# Patient Record
Sex: Female | Born: 1980 | Hispanic: No | Marital: Married | Smoking: Never smoker
Health system: Southern US, Community
[De-identification: ages and names within clinical notes are randomized; demographics above are authoritative.]

## PROBLEM LIST (undated history)

## (undated) DIAGNOSIS — N760 Acute vaginitis: Secondary | ICD-10-CM

## (undated) DIAGNOSIS — B9689 Other specified bacterial agents as the cause of diseases classified elsewhere: Secondary | ICD-10-CM

## (undated) DIAGNOSIS — O039 Complete or unspecified spontaneous abortion without complication: Secondary | ICD-10-CM

## (undated) DIAGNOSIS — N7011 Chronic salpingitis: Secondary | ICD-10-CM

## (undated) DIAGNOSIS — A599 Trichomoniasis, unspecified: Secondary | ICD-10-CM

## (undated) DIAGNOSIS — D219 Benign neoplasm of connective and other soft tissue, unspecified: Secondary | ICD-10-CM

## (undated) DIAGNOSIS — D649 Anemia, unspecified: Secondary | ICD-10-CM

## (undated) DIAGNOSIS — N946 Dysmenorrhea, unspecified: Secondary | ICD-10-CM

## (undated) HISTORY — DX: Complete or unspecified spontaneous abortion without complication: O03.9

## (undated) HISTORY — DX: Trichomoniasis, unspecified: A59.9

## (undated) HISTORY — DX: Dysmenorrhea, unspecified: N94.6

## (undated) HISTORY — DX: Chronic salpingitis: N70.11

## (undated) HISTORY — DX: Other specified bacterial agents as the cause of diseases classified elsewhere: B96.89

## (undated) HISTORY — DX: Acute vaginitis: N76.0

## (undated) HISTORY — PX: LASIK: SHX215

---

## 2002-09-29 ENCOUNTER — Other Ambulatory Visit: Admission: RE | Admit: 2002-09-29 | Discharge: 2002-09-29 | Payer: Self-pay | Admitting: Obstetrics and Gynecology

## 2003-10-01 ENCOUNTER — Other Ambulatory Visit: Admission: RE | Admit: 2003-10-01 | Discharge: 2003-10-01 | Payer: Self-pay | Admitting: Obstetrics and Gynecology

## 2005-07-13 ENCOUNTER — Other Ambulatory Visit: Admission: RE | Admit: 2005-07-13 | Discharge: 2005-07-13 | Payer: Self-pay | Admitting: Obstetrics and Gynecology

## 2007-01-10 ENCOUNTER — Emergency Department: Payer: Self-pay | Admitting: Emergency Medicine

## 2009-11-22 ENCOUNTER — Emergency Department: Payer: Self-pay | Admitting: Emergency Medicine

## 2010-10-13 ENCOUNTER — Ambulatory Visit: Payer: Self-pay | Admitting: Urology

## 2015-10-12 ENCOUNTER — Encounter: Payer: Self-pay | Admitting: Emergency Medicine

## 2015-10-12 ENCOUNTER — Emergency Department: Payer: 59

## 2015-10-12 DIAGNOSIS — O200 Threatened abortion: Secondary | ICD-10-CM | POA: Diagnosis not present

## 2015-10-12 DIAGNOSIS — O209 Hemorrhage in early pregnancy, unspecified: Secondary | ICD-10-CM | POA: Diagnosis present

## 2015-10-12 DIAGNOSIS — Z3A01 Less than 8 weeks gestation of pregnancy: Secondary | ICD-10-CM | POA: Diagnosis not present

## 2015-10-12 LAB — CBC
HCT: 35.9 % (ref 35.0–47.0)
Hemoglobin: 11.4 g/dL — ABNORMAL LOW (ref 12.0–16.0)
MCH: 26.2 pg (ref 26.0–34.0)
MCHC: 31.8 g/dL — ABNORMAL LOW (ref 32.0–36.0)
MCV: 82.5 fL (ref 80.0–100.0)
Platelets: 271 10*3/uL (ref 150–440)
RBC: 4.35 MIL/uL (ref 3.80–5.20)
RDW: 13.9 % (ref 11.5–14.5)
WBC: 8.8 10*3/uL (ref 3.6–11.0)

## 2015-10-12 LAB — URINALYSIS COMPLETE WITH MICROSCOPIC (ARMC ONLY)
Bilirubin Urine: NEGATIVE
Glucose, UA: NEGATIVE mg/dL
Ketones, ur: NEGATIVE mg/dL
Nitrite: NEGATIVE
Protein, ur: 30 mg/dL — AB
Specific Gravity, Urine: 1.006 (ref 1.005–1.030)
pH: 7 (ref 5.0–8.0)

## 2015-10-12 LAB — HCG, QUANTITATIVE, PREGNANCY: hCG, Beta Chain, Quant, S: 4076 m[IU]/mL — ABNORMAL HIGH (ref <5)

## 2015-10-12 NOTE — ED Notes (Signed)
Patient ambulatory to triage with steady gait, without difficulty or distress noted; pt reports increased vag bleeding and lower abd cramping today; st has had spotting with +pregnancy test at [facility]; hx fibroids

## 2015-10-13 ENCOUNTER — Emergency Department
Admission: EM | Admit: 2015-10-13 | Discharge: 2015-10-13 | Disposition: A | Discharge status: Home / Self Care | Payer: 59 | Attending: Emergency Medicine | Admitting: Emergency Medicine

## 2015-10-13 ENCOUNTER — Encounter: Payer: Self-pay | Admitting: Emergency Medicine

## 2015-10-13 DIAGNOSIS — O200 Threatened abortion: Secondary | ICD-10-CM

## 2015-10-13 LAB — ABO/RH: ABO/RH(D): O POS

## 2015-10-13 NOTE — Discharge Instructions (Signed)

## 2015-10-13 NOTE — ED Provider Notes (Signed)
[facility] Emergency Department Provider Note  ____________________________________________  Time seen: 1:00 AM I have reviewed the triage vital signs and the nursing notes.   HISTORY  Chief Complaint Vaginal Bleeding     HPI Toni Byrd is a 34 y.o. female presents with vaginal bleeding 3 weeks with acute worsening today accompanied by clots. Patient states that she was informed last week that she was pregnant by [facility]. Last menstrual period October 27. Patient states earlier she had considerable pelvic cramps 9 out of 10 and since resolved.     Past medical history Uterine fibroid There are no active problems to display for this patient.   Past Surgical History  Procedure Laterality Date  . Lasik      No current outpatient prescriptions on file.  Allergies No known drug allergies No family history on file.  Social History Social History  Substance Use Topics  . Smoking status: Never Smoker   . Smokeless tobacco: None  . Alcohol Use: No    Review of Systems  Constitutional: Negative for fever. Eyes: Negative for visual changes. ENT: Negative for sore throat. Cardiovascular: Negative for chest pain. Respiratory: Negative for shortness of breath. Gastrointestinal: Negative for abdominal pain, vomiting and diarrhea. Genitourinary: Negative for dysuria. Musculoskeletal: Negative for back pain. Skin: Negative for rash. Neurological: Negative for headaches, focal weakness or numbness.   10-point ROS otherwise negative.  ____________________________________________   PHYSICAL EXAM:  VITAL SIGNS: ED Triage Vitals  Enc Vitals Group     BP 10/12/15 2111 145/84 mmHg     Pulse Rate 10/12/15 2111 100     Resp 10/12/15 2111 18     Temp 10/12/15 2111 98.5 F (36.9 C)     Temp Source 10/12/15 2111 Oral     SpO2 10/12/15 2111 97 %     Weight 10/12/15 2111 188 lb (85.276 kg)     Height 10/12/15 2111 5\' 7"  (1.702 m)   Head Cir --      Peak Flow --      Pain Score 10/13/15 0043 9     Pain Loc --      Pain Edu? --      Excl. in [place]? --      Constitutional: Alert and oriented. Well appearing and in no distress. Eyes: Conjunctivae are normal. PERRL. Normal extraocular movements. ENT   Head: Normocephalic and atraumatic.   Nose: No congestion/rhinnorhea.   Mouth/Throat: Mucous membranes are moist.   Neck: No stridor. Hematological/Lymphatic/Immunilogical: No cervical lymphadenopathy. Cardiovascular: Normal rate, regular rhythm. Normal and symmetric distal pulses are present in all extremities. No murmurs, rubs, or gallops. Respiratory: Normal respiratory effort without tachypnea nor retractions. Breath sounds are clear and equal bilaterally. No wheezes/rales/rhonchi. Gastrointestinal: Soft and nontender. No distention. There is no CVA tenderness. Genitourinary: deferred Musculoskeletal: Nontender with normal range of motion in all extremities. No joint effusions.  No lower extremity tenderness nor edema. Neurologic:  Normal speech and language. No gross focal neurologic deficits are appreciated. Speech is normal.  Skin:  Skin is warm, dry and intact. No rash noted. Psychiatric: Mood and affect are normal. Speech and behavior are normal. Patient exhibits appropriate insight and judgment.  ____________________________________________    LABS (pertinent positives/negatives)  Labs Reviewed  HCG, QUANTITATIVE, PREGNANCY - Abnormal; Notable for the following:    hCG, Beta Chain, Quant, S 4076 (*)    All other components within normal limits  CBC - Abnormal; Notable for the following:    Hemoglobin 11.4 (*)  MCHC 31.8 (*)    All other components within normal limits  URINALYSIS COMPLETEWITH MICROSCOPIC ([facility] ONLY) - Abnormal; Notable for the following:    Color, Urine RED (*)    APPearance HAZY (*)    Hgb urine dipstick 3+ (*)    Protein, ur 30 (*)    Leukocytes, UA TRACE (*)     Bacteria, UA RARE (*)    Squamous Epithelial / LPF 0-5 (*)    All other components within normal limits  ABO/RH       RADIOLOGY US OB Comp Less 14 Wks (Final result) Result time: 10/12/15 22:40:10   Final result by Rad Results In Interface (10/12/15 22:40:10)   Narrative:   CLINICAL DATA: Vaginal bleeding for 1 day  EXAM: OBSTETRIC <14 WK [place] AND TRANSVAGINAL OB US  TECHNIQUE: Both transabdominal and transvaginal ultrasound examinations were performed for complete evaluation of the gestation as well as the maternal uterus, adnexal regions, and pelvic cul-de-sac. Transvaginal technique was performed to assess early pregnancy.  COMPARISON: None.  FINDINGS: Intrauterine gestational sac: No  Yolk sac: No  Embryo: No  Cardiac Activity: No  Heart Rate:  bpm  MSD:  mm  w   d  CRL:  mm  w  d         [place] EDC:  Maternal uterus/adnexae: Normal right ovary. Nonvisualization of the left ovary. Small amount of fluid in the endometrial canal. No free pelvic fluid. Multiple uterine fibroids measuring up to 2.0 cm.  IMPRESSION: No visible gestational sac. There is blood or fluid in the endometrial canal. Suspicious for early failed pregnancy Recommend follow-up US in 10-14 days for definitive diagnosis. This recommendation follows SRU consensus guidelines: Diagnostic Criteria for Nonviable Pregnancy Early in the First Trimester. [place] Med 2013KT:048977.  Multiple uterine fibroids measuring up to 2.0 cm.   Electronically Signed By: Andreas Newport M.D. On: 10/12/2015 22:40          US Ob Transvaginal (Final result) Result time: 10/12/15 22:40:10   Final result by Rad Results In Interface (10/12/15 22:40:10)   Narrative:   CLINICAL DATA: Vaginal bleeding for 1 day  EXAM: OBSTETRIC <14 WK [place] AND TRANSVAGINAL OB US  TECHNIQUE: Both transabdominal and transvaginal ultrasound examinations were performed for complete  evaluation of the gestation as well as the maternal uterus, adnexal regions, and pelvic cul-de-sac. Transvaginal technique was performed to assess early pregnancy.  COMPARISON: None.  FINDINGS: Intrauterine gestational sac: No  Yolk sac: No  Embryo: No  Cardiac Activity: No  Heart Rate:  bpm  MSD:  mm  w   d  CRL:  mm  w  d         [place] EDC:  Maternal uterus/adnexae: Normal right ovary. Nonvisualization of the left ovary. Small amount of fluid in the endometrial canal. No free pelvic fluid. Multiple uterine fibroids measuring up to 2.0 cm.  IMPRESSION: No visible gestational sac. There is blood or fluid in the endometrial canal. Suspicious for early failed pregnancy Recommend follow-up US in 10-14 days for definitive diagnosis. This recommendation follows SRU consensus guidelines: Diagnostic Criteria for Nonviable Pregnancy Early in the First Trimester. [place] Med 2013KT:048977.  Multiple uterine fibroids measuring up to 2.0 cm.   Electronically Signed By: Andreas Newport M.D. On: 10/12/2015 22:40        INITIAL IMPRESSION / ASSESSMENT AND PLAN / ED COURSE  Pertinent labs & imaging results that were available during my care of  the patient were reviewed by me and considered in my medical decision making (see chart for details).  History and physical exam concerning for possible miscarriage patient was to follow up with [facility] side OB for repeat and serial hCG  ____________________________________________   FINAL CLINICAL IMPRESSION(S) / ED DIAGNOSES  Final diagnoses:  Threatened miscarriage      Gregor Hams, MD 10/13/15 0145

## 2015-10-13 NOTE — ED Notes (Signed)
Pt in with co vaginal bleeding x 3 weeks, states was lighter then a period.  Was told last week she was pregnant, states first pregnancy.  Was told last week she is pregnant and today bleeding became heavier with clots.  Also having abdominal cramping rates it 9/10.  Hx of uterine fibroids.

## 2015-10-13 NOTE — ED Notes (Signed)
Pt discharged home with significant other.  Discharge instructions reviewed, voiced understanding.  No questions or concerns at this time.  Pt in NAD.  Items with pt upon discharge, no items left in ED.

## 2016-11-20 DIAGNOSIS — R1032 Left lower quadrant pain: Secondary | ICD-10-CM | POA: Diagnosis not present

## 2016-11-20 DIAGNOSIS — Z01419 Encounter for gynecological examination (general) (routine) without abnormal findings: Secondary | ICD-10-CM | POA: Diagnosis not present

## 2016-11-20 DIAGNOSIS — Z1389 Encounter for screening for other disorder: Secondary | ICD-10-CM | POA: Diagnosis not present

## 2016-11-20 DIAGNOSIS — N921 Excessive and frequent menstruation with irregular cycle: Secondary | ICD-10-CM | POA: Diagnosis not present

## 2016-12-10 DIAGNOSIS — N920 Excessive and frequent menstruation with regular cycle: Secondary | ICD-10-CM | POA: Diagnosis not present

## 2016-12-10 DIAGNOSIS — N921 Excessive and frequent menstruation with irregular cycle: Secondary | ICD-10-CM | POA: Diagnosis not present

## 2016-12-10 DIAGNOSIS — R1032 Left lower quadrant pain: Secondary | ICD-10-CM | POA: Diagnosis not present

## 2017-01-24 DIAGNOSIS — J019 Acute sinusitis, unspecified: Secondary | ICD-10-CM | POA: Diagnosis not present

## 2017-05-07 ENCOUNTER — Telehealth: Payer: Self-pay | Admitting: Obstetrics and Gynecology

## 2017-05-07 NOTE — Telephone Encounter (Signed)
Pt is schedule 02/15/97 with Elmo Putt Copland

## 2017-05-07 NOTE — Telephone Encounter (Signed)
Pt called the after hour nurse line to request an appointment for Urination pain. I called and left voicemail for patient to call back to be schedule.

## 2017-05-08 ENCOUNTER — Ambulatory Visit (INDEPENDENT_AMBULATORY_CARE_PROVIDER_SITE_OTHER): Payer: 59 | Admitting: Obstetrics and Gynecology

## 2017-05-08 ENCOUNTER — Encounter: Payer: Self-pay | Admitting: Obstetrics and Gynecology

## 2017-05-08 VITALS — BP 118/74 | Ht 67.0 in | Wt 185.0 lb

## 2017-05-08 DIAGNOSIS — N3000 Acute cystitis without hematuria: Secondary | ICD-10-CM | POA: Diagnosis not present

## 2017-05-08 DIAGNOSIS — Z3169 Encounter for other general counseling and advice on procreation: Secondary | ICD-10-CM

## 2017-05-08 LAB — POCT URINALYSIS DIPSTICK
Bilirubin, UA: NEGATIVE
Blood, UA: NEGATIVE
Glucose, UA: NEGATIVE
Ketones, UA: NEGATIVE
Nitrite, UA: NEGATIVE
Protein, UA: NEGATIVE
Spec Grav, UA: 1.015 (ref 1.010–1.025)
Urobilinogen, UA: NEGATIVE E.U./dL
pH, UA: 7 (ref 5.0–8.0)

## 2017-05-08 MED ORDER — NITROFURANTOIN MONOHYD MACRO 100 MG PO CAPS: 100.0000 mg | ORAL_CAPSULE | Freq: Two times a day (BID) | ORAL | 0 refills | Status: AC

## 2017-05-08 NOTE — Progress Notes (Signed)
Chief Complaint  Patient presents with  . Urinary Tract Infection    HPI:      [name] is a 36 y.o. G1P0010 who LMP was Patient's last menstrual period was 05/01/2017., presents today for UTI with urinary frequency/urgency, dysuria for 3 days. No LBP, belly pain, fevers, hematuria, vag sx. She has a hx of UTIs several yrs ago.   She also has been unable to conceive in the past 8 months. She is s/p SAB last yr. It took awhile to conceive last time as well. Menses are Q21-28 days. She has tried urine ovulation kit that was negative.   There are no active problems to display for this patient.   Family History  Problem Relation Age of Onset  . Rheum arthritis Mother   . Hypothyroidism Mother   . Hypertension Father   . Breast cancer Maternal Grandmother     Social History   Social History  . Marital status: Unknown    Spouse name: N/A  . Number of children: N/A  . Years of education: N/A   Occupational History  . Not on file.   Social History Main Topics  . Smoking status: Never Smoker  . Smokeless tobacco: Never Used  . Alcohol use No  . Drug use: Unknown  . Sexual activity: Yes    Birth control/ protection: None   Other Topics Concern  . Not on file   Social History Narrative  . No narrative on file     Current Outpatient Prescriptions:  .  nitrofurantoin, macrocrystal-monohydrate, (MACROBID) 100 MG capsule, Take 1 capsule (100 mg total) by mouth 2 (two) times daily., Disp: 14 capsule, Rfl: 0  Review of Systems  Constitutional: Negative for fever.  Gastrointestinal: Negative for blood in stool, constipation, diarrhea, nausea and vomiting.  Genitourinary: Positive for dysuria, frequency and urgency. Negative for dyspareunia, flank pain, hematuria, vaginal bleeding, vaginal discharge and vaginal pain.  Musculoskeletal: Negative for back pain.  Skin: Negative for rash.     OBJECTIVE:   Vitals:  BP 118/74   Ht _0  (1.702 m)   Wt 185 lb  (83.9 kg)   LMP 05/01/2017   BMI 28.98 kg/m   Physical Exam  Constitutional: She is oriented to person, place, and time and well-developed, well-nourished, and in no distress.  Abdominal: There is no CVA tenderness.  Neurological: She is alert and oriented to person, place, and time.  Psychiatric: Affect and judgment normal.  Vitals reviewed.   Results: Results for orders placed or performed in visit on 05/08/17 (from the past 24 hour(s))  POCT Urinalysis Dipstick     Status: Abnormal   Collection Time: 05/08/17 11:46 AM  Result Value Ref Range   Color, UA yellow    Clarity, UA clear    Glucose, UA neg    Bilirubin, UA neg    Ketones, UA neg    Spec Grav, UA 1.015 1.010 - 1.025   Blood, UA neg    pH, UA 7.0 5.0 - 8.0   Protein, UA neg    Urobilinogen, UA negative 0.2 or 1.0 E.U./dL   Nitrite, UA neg    Leukocytes, UA Small (1+) (A) Negative     Assessment/Plan: Acute cystitis without hematuria - Rx macrobid. Check C&S. F/u prn.  - Plan: POCT Urinalysis Dipstick, nitrofurantoin, macrocrystal-monohydrate, (MACROBID) 100 MG capsule, Urine Culture  Infertility counseling - Pt without conception in 8 mos, s/p SAB last yr. Menses Q21-28 days. Check prog July 9 (about  day 19). Will f/u with results.     Meds ordered this encounter  Medications  . nitrofurantoin, macrocrystal-monohydrate, (MACROBID) 100 MG capsule    Sig: Take 1 capsule (100 mg total) by mouth 2 (two) times daily.    Dispense:  14 capsule    Refill:  0      Return if symptoms worsen or fail to improve.  Alicia B. Copland, PA-C 05/08/2017 11:47 AM

## 2017-05-10 LAB — URINE CULTURE: Organism ID, Bacteria: NO GROWTH

## 2017-05-20 ENCOUNTER — Other Ambulatory Visit: Payer: 59

## 2017-05-20 DIAGNOSIS — Z3169 Encounter for other general counseling and advice on procreation: Secondary | ICD-10-CM

## 2017-05-21 ENCOUNTER — Telehealth: Payer: Self-pay | Admitting: Obstetrics and Gynecology

## 2017-05-21 LAB — PROGESTERONE: Progesterone: 1.9 ng/mL

## 2017-05-21 NOTE — Telephone Encounter (Signed)
LM for pt with neg prog results. Pt to call with date of next menses to correlate prog with cycle. If done at right time (pt's menses Q21-28 days), will refer to MD for further mgmt.

## 2017-06-07 ENCOUNTER — Telehealth: Payer: Self-pay

## 2017-06-07 DIAGNOSIS — N839 Noninflammatory disorder of ovary, fallopian tube and broad ligament, unspecified: Secondary | ICD-10-CM

## 2017-06-07 NOTE — Telephone Encounter (Signed)
Pt calling to let ABC know she started her cycle on 06/01/17

## 2017-06-10 NOTE — Telephone Encounter (Signed)
LMTRC

## 2017-06-11 NOTE — Telephone Encounter (Signed)
LMTRC

## 2017-06-11 NOTE — Telephone Encounter (Signed)
Pt returning ABC call regarding test results. Cb#[phone].

## 2017-06-14 NOTE — Telephone Encounter (Signed)
Pt calling for results, pt aware ABC is not in the office

## 2017-06-17 NOTE — Telephone Encounter (Signed)
Pt states that her cycles vary from 21-28 day cycles but they typically start around the 19th or 20th of each month. Pt aware ABC out of the office. Please advise. Thank you!

## 2017-06-17 NOTE — Telephone Encounter (Signed)
RN to notify pt that progesterone test not done at correct time since her last menses was late. What is cycle length from 1st day of one period to first day of next? Can rechk labs again but is dependent on her cycle frequency.

## 2017-06-17 NOTE — Telephone Encounter (Signed)
Left msg for pt to call back

## 2017-06-19 NOTE — Telephone Encounter (Signed)
Will rechk prog Aug 13 then. Order in. RN to notify pt and put her on lab schedule for that day.

## 2017-06-20 NOTE — Telephone Encounter (Signed)
Pt aware and transferred to front desk to be added to lab schedule.

## 2017-06-24 ENCOUNTER — Other Ambulatory Visit: Payer: 59

## 2017-06-24 DIAGNOSIS — N839 Noninflammatory disorder of ovary, fallopian tube and broad ligament, unspecified: Secondary | ICD-10-CM

## 2017-06-25 LAB — PROGESTERONE: Progesterone: 5.5 ng/mL

## 2017-08-06 ENCOUNTER — Ambulatory Visit (INDEPENDENT_AMBULATORY_CARE_PROVIDER_SITE_OTHER): Payer: 59 | Admitting: Advanced Practice Midwife

## 2017-08-06 ENCOUNTER — Encounter: Payer: Self-pay | Admitting: Advanced Practice Midwife

## 2017-08-06 VITALS — BP 124/74 | Wt 197.0 lb

## 2017-08-06 DIAGNOSIS — Z3490 Encounter for supervision of normal pregnancy, unspecified, unspecified trimester: Secondary | ICD-10-CM

## 2017-08-06 DIAGNOSIS — O200 Threatened abortion: Secondary | ICD-10-CM

## 2017-08-06 NOTE — Progress Notes (Signed)
New Obstetric Patient H&P    Chief Complaint: "Desires prenatal care"   History of Present Illness: Patient is a 36 y.o. G2P0010 Not Hispanic or [place] female, LMP 06/29/2017 presents with amenorrhea and positive home pregnancy test. Based on her LMP, her EDD is Estimated Date of Delivery: 04/05/2018. and her EGA is [redacted]w[redacted]d. Cycles are 4. days, regular, and occur approximately every : 28 days. Her last pap smear was 1 years ago and was no abnormalities.    She had a urine pregnancy test which was positive 1 day(s)  ago. Her last menstrual period was normal and lasted for  3 or 4 day(s). Since her LMP she claims she has experienced cramping on Sunday night and bleeding using pads yesterday. She had sharp left and mid pelvic pain this morning and bleeding resumed. Her past medical history is noncontributory. Her prior pregnancies are notable for miscarriage 2 years ago  Since her LMP, she admits to the use of tobacco products  no She claims she has gained   a few pounds since the start of her pregnancy.  There are cats in the home in the home  no  She admits close contact with children on a regular basis  yes  She has had chicken pox in the past yes She has had Tuberculosis exposures, symptoms, or previously tested positive for TB   no Current or past history of domestic violence. no  Genetic Screening/Teratology Counseling: (Includes patient, baby's father, or anyone in either family with:)   45. Patient's age >/= 22 at [place]  no 2. Thalassemia ([place], [place], [place], or Asian background): MCV<80  no 3. Neural tube defect (meningomyelocele, spina bifida, anencephaly)  no 4. Congenital heart defect  no  5. Down syndrome  no 6. Tay-Sachs (Jewish, [place])  no 7. Canavan's Disease  no 8. Sickle cell disease or trait (African)  no  9. Hemophilia or other blood disorders  no  10. Muscular dystrophy  no  11. Cystic fibrosis  no  12. Huntington's Chorea  no  13. Mental  retardation/autism  no 14. Other inherited genetic or chromosomal disorder  no 15. Maternal metabolic disorder (DM, PKU, etc)  no 16. Patient or FOB with a child with a birth defect not listed above no  16a. Patient or FOB with a birth defect themselves no 17. Recurrent pregnancy loss, or stillbirth  no  18. Any medications since LMP other than prenatal vitamins (include vitamins, supplements, OTC meds, drugs, alcohol)  no 19. Any other genetic/environmental exposure to discuss: The patient works in toxicology at [employer]  Infection History:   1. Lives with someone with TB or TB exposed  no  2. Patient or partner has history of genital herpes  no 3. Rash or viral illness since LMP  no 4. History of STI (GC, CT, HPV, syphilis, HIV)  no 5. History of recent travel :  no  Other pertinent information:  no     Review of Systems:10 point review of systems negative unless otherwise noted in HPI  Past Medical History:  Past Medical History:  Diagnosis Date  . Bacterial vaginitis   . Dysmenorrhea   . Hydrosalpinx   . Spontaneous abortion   . Trichomoniasis     Past Surgical History:  Past Surgical History:  Procedure Laterality Date  . LASIK      Gynecologic History: Patient's last menstrual period was 05/29/2017.  Obstetric History: G2P0010  Family History:  Family History  Problem Relation Age  of Onset  . Rheum arthritis Mother   . Hypothyroidism Mother   . Hypertension Father   . Breast cancer Maternal Grandmother     Social History:  Social History   Social History  . Marital status: Unknown    Spouse name: N/A  . Number of children: N/A  . Years of education: N/A   Occupational History  . Not on file.   Social History Main Topics  . Smoking status: Never Smoker  . Smokeless tobacco: Never Used  . Alcohol use No  . Drug use: Unknown  . Sexual activity: Yes    Birth control/ protection: None   Other Topics Concern  . Not on file   Social History  Narrative  . No narrative on file    Allergies:  No Known Allergies  Medications: Prior to Admission medications   Not on File    Physical Exam Vitals: Blood pressure 124/74, weight 197 lb (89.4 kg), last menstrual period 05/29/2017.  General: NAD HEENT: normocephalic, anicteric Thyroid: no enlargement, no palpable nodules Pulmonary: No increased work of breathing, CTAB Cardiovascular: RRR, distal pulses 2+ Abdomen: NABS, soft, non-tender, non-distended.  Umbilicus without lesions.  No hepatomegaly, splenomegaly or masses palpable. No evidence of hernia  Genitourinary: Deferred for bleeding today Extremities: no edema, erythema, or tenderness Neurologic: Grossly intact Psychiatric: mood appropriate, affect full POCT pregnancy test positive   Assessment: 36 y.o. G2P0010 at Unknown presenting to initiate prenatal care  Plan: 1) Avoid alcoholic beverages. 2) Patient encouraged not to smoke.  3) Discontinue the use of all non-medicinal drugs and chemicals.  4) Take prenatal vitamins daily.  5) Nutrition, food safety (fish, cheese advisories, and high nitrite foods) and exercise discussed. 6) Hospital and practice style discussed with cross coverage system.  7) Genetic Screening, such as with 1st Trimester Screening, cell free fetal DNA, AFP testing, and Ultrasound, as well as with amniocentesis and CVS as appropriate, is discussed with patient. At the conclusion of today's visit patient requested genetic testing 8) Patient is asked about travel to areas at risk for the Zika virus, and counseled to avoid travel and exposure to mosquitoes or sexual partners who may have themselves been exposed to the virus. Testing is discussed, and will be ordered as appropriate.  9) Serial beta Hcg levels today and on Thursday 10) Viability scan when patient returns from her vacation  Rod Can, CNM

## 2017-08-06 NOTE — Progress Notes (Signed)
NOB today.  

## 2017-08-07 LAB — BETA HCG QUANT (REF LAB): hCG Quant: 42 m[IU]/mL

## 2017-08-08 ENCOUNTER — Other Ambulatory Visit: Payer: 59

## 2017-08-08 DIAGNOSIS — O200 Threatened abortion: Secondary | ICD-10-CM

## 2017-08-08 LAB — URINE CULTURE

## 2017-08-08 LAB — BETA HCG QUANT (REF LAB): hCG Quant: 43 m[IU]/mL

## 2017-08-14 ENCOUNTER — Other Ambulatory Visit: Payer: Self-pay | Admitting: Advanced Practice Midwife

## 2017-08-14 ENCOUNTER — Ambulatory Visit (INDEPENDENT_AMBULATORY_CARE_PROVIDER_SITE_OTHER): Payer: 59 | Admitting: Obstetrics and Gynecology

## 2017-08-14 ENCOUNTER — Ambulatory Visit (INDEPENDENT_AMBULATORY_CARE_PROVIDER_SITE_OTHER): Payer: 59

## 2017-08-14 VITALS — BP 118/74 | Wt 198.0 lb

## 2017-08-14 DIAGNOSIS — O039 Complete or unspecified spontaneous abortion without complication: Secondary | ICD-10-CM | POA: Diagnosis not present

## 2017-08-14 DIAGNOSIS — O021 Missed abortion: Secondary | ICD-10-CM

## 2017-08-14 DIAGNOSIS — O200 Threatened abortion: Secondary | ICD-10-CM

## 2017-08-14 NOTE — Progress Notes (Signed)
   Gynecology Ultrasound Follow Up   Chief Complaint  Patient presents with  . Follow up ultrasound for possible spontaneous abortion     History of Present Illness: Patient is a 36 y.o. female who presents today for ultrasound evaluation of possible spontaneous abortion.  Ultrasound demonstrates the following findings Adnexa: no masses seen  Uterus: anteverted with endometrial stripe  4.9 mm Additional: No evidence of intrauterine pregnancy.  Fibroids noted (see image).  a  Bleeding last week, ended this past Sunday. No abdominal pain, no fevers, chills.    Past Medical History:  Diagnosis Date  . Bacterial vaginitis   . Dysmenorrhea   . Hydrosalpinx   . Spontaneous abortion   . Trichomoniasis     Past Surgical History:  Procedure Laterality Date  . LASIK       Family History  Problem Relation Age of Onset  . Rheum arthritis Mother   . Hypothyroidism Mother   . Hypertension Father   . Breast cancer Maternal Grandmother     Social History   Social History  . Marital status: Unknown    Spouse name: N/A  . Number of children: N/A  . Years of education: N/A   Occupational History  . Not on file.   Social History Main Topics  . Smoking status: Never Smoker  . Smokeless tobacco: Never Used  . Alcohol use No  . Drug use: Unknown  . Sexual activity: Yes    Birth control/ protection: None   Other Topics Concern  . Not on file   Social History Narrative  . No narrative on file    No Known Allergies  Prior to Admission medications   Not on File    Physical Exam BP 118/74   Wt 198 lb (89.8 kg)   LMP 05/29/2017   BMI 31.01 kg/m   Physical Exam  Constitutional: She is oriented to person, place, and time and well-developed, well-nourished, and in no distress. No distress.  HENT:  Head: Normocephalic and atraumatic.  Eyes: Conjunctivae are normal. No scleral icterus.  Cardiovascular: Normal rate and regular rhythm.   Pulmonary/Chest: Effort  normal and breath sounds normal.  Abdominal: Soft. Bowel sounds are normal. She exhibits no distension. There is no tenderness. There is no rebound and no guarding.  Musculoskeletal: She exhibits no edema or tenderness.  Neurological: She is alert and oriented to person, place, and time.  Skin: Skin is warm and dry. No erythema.  Psychiatric: Mood, affect and judgment normal.    Assessment: 36 y.o. G2P0010 No problem-specific Assessment & Plan notes found for this encounter.   Plan: Problem List Items Addressed This Visit    None    Visit Diagnoses    Missed abortion    -  Primary   Relevant Orders   Beta HCG, Quant     It appears she has nearly completed a  Spontaneous abortion. However, she did not have a significant drop in her hCG levels form her prior two values. Will repeat today. If not dropping significantly, will have to consider other diagnoses.  Blood type is O+.  15 minutes spent in face to face discussion with > 50% spent in counseling and management of her spontaneous abortion.    Prentice Docker, MD 08/16/2017 3:04 PM

## 2017-08-15 LAB — BETA HCG QUANT (REF LAB): hCG Quant: 17 m[IU]/mL

## 2017-08-16 ENCOUNTER — Telehealth: Payer: Self-pay | Admitting: Obstetrics and Gynecology

## 2017-08-16 NOTE — Telephone Encounter (Signed)
Left generic VM to call me back 

## 2017-08-19 ENCOUNTER — Telehealth: Payer: Self-pay

## 2017-08-19 DIAGNOSIS — N96 Recurrent pregnancy loss: Secondary | ICD-10-CM | POA: Insufficient documentation

## 2017-08-19 NOTE — Telephone Encounter (Signed)
Pt would like to discuss results c SDJ.  [phone]

## 2017-08-19 NOTE — Telephone Encounter (Signed)
Discussed drop in quant hCG. Patient would also like to pursue a workup of why is is having miscarriages.   Will start this workup once she has completed this miscarriage.  Will recheck hCG in 1 week.

## 2017-08-19 NOTE — Telephone Encounter (Signed)
Please call pt back.

## 2017-09-10 ENCOUNTER — Telehealth: Payer: Self-pay

## 2017-09-10 NOTE — Telephone Encounter (Signed)
Pt calling to discuss f/u and request call back from SDJ. Cb# [phone] thank you

## 2017-09-10 NOTE — Telephone Encounter (Signed)
Pt returned SDJ phone call 3:02 pm.

## 2017-09-10 NOTE — Telephone Encounter (Signed)
Left generic VM 

## 2017-09-11 NOTE — Telephone Encounter (Signed)
Pt asking for SDJ to please call her to discuss further options. [phone].

## 2017-09-17 NOTE — Telephone Encounter (Signed)
Left generic vm 

## 2017-09-19 ENCOUNTER — Telehealth: Payer: Self-pay

## 2017-09-19 NOTE — Telephone Encounter (Signed)
Left generic vm for her to return my call.

## 2017-09-19 NOTE — Telephone Encounter (Signed)
Pt calling for SDJ, She would like to speak to [facility] regarding options. No other information was given. CB# [phone]

## 2017-09-25 NOTE — Telephone Encounter (Signed)
Pt still has not called back, was not sure if you were going to call her back or complete msg

## 2017-10-10 ENCOUNTER — Telehealth: Payer: Self-pay

## 2017-10-10 NOTE — Telephone Encounter (Signed)
Pt calling to speak to SDJ to discuss options. Cb# [phone] thank you

## 2017-10-16 ENCOUNTER — Other Ambulatory Visit: Payer: Self-pay

## 2017-10-16 ENCOUNTER — Ambulatory Visit (INDEPENDENT_AMBULATORY_CARE_PROVIDER_SITE_OTHER): Payer: 59 | Admitting: Obstetrics and Gynecology

## 2017-10-16 ENCOUNTER — Encounter: Payer: Self-pay | Admitting: Obstetrics and Gynecology

## 2017-10-16 ENCOUNTER — Ambulatory Visit: Payer: 59 | Admitting: Obstetrics and Gynecology

## 2017-10-16 VITALS — BP 110/70 | HR 76 | Ht 67.0 in | Wt 192.0 lb

## 2017-10-16 DIAGNOSIS — N760 Acute vaginitis: Secondary | ICD-10-CM | POA: Diagnosis not present

## 2017-10-16 DIAGNOSIS — B9689 Other specified bacterial agents as the cause of diseases classified elsewhere: Secondary | ICD-10-CM | POA: Diagnosis not present

## 2017-10-16 LAB — POCT WET PREP WITH KOH
KOH Prep POC: POSITIVE — AB
Trichomonas, UA: NEGATIVE
Yeast Wet Prep HPF POC: NEGATIVE

## 2017-10-16 MED ORDER — METRONIDAZOLE 1.3 % VA GEL: 1.0000 | Freq: Once | VAGINAL | 0 refills | Status: AC

## 2017-10-16 NOTE — Patient Instructions (Signed)
I value your feedback and entrusting us with your care. If you get a [facility] patient survey, I would appreciate you taking the time to let us know about your experience today. Thank you! 

## 2017-10-16 NOTE — Progress Notes (Signed)
Chief Complaint  Patient presents with  . Vaginal Discharge    Odorous d/c no itching x 3 days no OTC use    HPI:      Toni Byrd is a 36 y.o. G2P0010 who LMP was Patient's last menstrual period was 09/28/2017., presents today for increased vag d/c without odor or irritation for the past 4 days. Sx unusual for pt. Hx of BV in the past. No urin sx, no LBP, belly pain, fevers. S/P SAB 10/18. Has had a normal period since.  Past Medical History:  Diagnosis Date  . Bacterial vaginitis   . Dysmenorrhea   . Hydrosalpinx   . Spontaneous abortion   . Trichomoniasis     Past Surgical History:  Procedure Laterality Date  . LASIK      Family History  Problem Relation Age of Onset  . Rheum arthritis Mother   . Hypothyroidism Mother   . Hypertension Father   . Breast cancer Maternal Grandmother     Social History   Socioeconomic History  . Marital status: Unknown    Spouse name: Not on file  . Number of children: Not on file  . Years of education: Not on file  . Highest education level: Not on file  Social Needs  . Financial resource strain: Not on file  . Food insecurity - worry: Not on file  . Food insecurity - inability: Not on file  . Transportation needs - medical: Not on file  . Transportation needs - non-medical: Not on file  Occupational History  . Not on file  Tobacco Use  . Smoking status: Never Smoker  . Smokeless tobacco: Never Used  Substance and Sexual Activity  . Alcohol use: No  . Drug use: Not on file  . Sexual activity: Yes    Birth control/protection: None  Other Topics Concern  . Not on file  Social History Narrative  . Not on file     Current Outpatient Medications:  Marland Kitchen  MetroNIDAZOLE (NUVESSA) 1.3 % GEL, Place 1 Tube vaginally once for 1 dose., Disp: 1 Tube, Rfl: 0   ROS:  Review of Systems  Constitutional: Negative for fever.  Gastrointestinal: Negative for blood in stool, constipation, diarrhea, nausea and vomiting.    Genitourinary: Positive for vaginal discharge. Negative for dyspareunia, dysuria, flank pain, frequency, hematuria, urgency, vaginal bleeding and vaginal pain.  Musculoskeletal: Negative for back pain.  Skin: Negative for rash.     OBJECTIVE:   Vitals:  BP 110/70   Pulse 76   Ht 5\' 7"  (1.702 m)   Wt 192 lb (87.1 kg)   LMP 09/28/2017   BMI 30.07 kg/m   Physical Exam  Constitutional: She is oriented to person, place, and time and well-developed, well-nourished, and in no distress. Vital signs are normal.  Genitourinary: Uterus normal, cervix normal, right adnexa normal, left adnexa normal and vulva normal. Uterus is not enlarged. Cervix exhibits no motion tenderness and no tenderness. Right adnexum displays no mass and no tenderness. Left adnexum displays no mass and no tenderness. Vulva exhibits no erythema, no exudate, no lesion, no rash and no tenderness. Vagina exhibits no lesion. Thin  odorless  clear and vaginal discharge found.  Neurological: She is oriented to person, place, and time.  Vitals reviewed.   Results: Results for orders placed or performed in visit on 10/16/17 (from the past 24 hour(s))  POCT Wet Prep with KOH     Status: Abnormal   Collection Time: 10/16/17  2:35  PM  Result Value Ref Range   Trichomonas, UA Negative    Clue Cells Wet Prep HPF POC few    Epithelial Wet Prep HPF POC  Few, Moderate, Many, Too numerous to count   Yeast Wet Prep HPF POC neg    Bacteria Wet Prep HPF POC  Few   RBC Wet Prep HPF POC     WBC Wet Prep HPF POC     KOH Prep POC Positive (A) Negative     Assessment/Plan: Bacterial vaginosis - Rx nuvessa. Coupon card. F/u prn. Will RF if sx recur. - Plan: POCT Wet Prep with KOH, MetroNIDAZOLE (NUVESSA) 1.3 % GEL    Meds ordered this encounter  Medications  . MetroNIDAZOLE (NUVESSA) 1.3 % GEL    Sig: Place 1 Tube vaginally once for 1 dose.    Dispense:  1 Tube    Refill:  0      Return if symptoms worsen or fail to  improve.  Alicia B. Copland, PA-C 10/16/2017 2:37 PM

## 2017-10-17 NOTE — Telephone Encounter (Signed)
Left generic vm to call me back

## 2017-10-24 NOTE — Telephone Encounter (Signed)
Pt has not called back, do you want to try to call her back or send letter?

## 2017-10-24 NOTE — Telephone Encounter (Signed)
Pt called triage returning your call

## 2017-10-28 NOTE — Telephone Encounter (Signed)
Left generic vm 

## 2017-10-29 ENCOUNTER — Other Ambulatory Visit: Payer: 59

## 2017-10-29 ENCOUNTER — Other Ambulatory Visit: Payer: Self-pay | Admitting: Obstetrics and Gynecology

## 2017-10-29 ENCOUNTER — Telehealth: Payer: Self-pay

## 2017-10-29 DIAGNOSIS — N96 Recurrent pregnancy loss: Secondary | ICD-10-CM

## 2017-10-29 NOTE — Telephone Encounter (Signed)
Pt is schedule 10/29/17

## 2017-10-29 NOTE — Telephone Encounter (Signed)
Discussed workup with patient.  She would like to pursue.  Will get her scheduled for blood work initially. Will take work up further based on those results.

## 2017-10-29 NOTE — Telephone Encounter (Signed)
Pt is calling for SDJ or his nurse. She did not leave a detailed message but stated she would have her phone on her when you call back. CB# [phone]

## 2017-10-29 NOTE — Telephone Encounter (Signed)
fwding to SDJ, he will call today when he gets a chance.

## 2017-11-02 LAB — ANTIPHOSPHOLIPID SYNDROME COMP
APTT 1:1 NP: 27.8 s
APTT 1:1 Saline: 41.5 s
APTT: 34 s — ABNORMAL HIGH
Anticardiolipin Ab, IgA: <10 [APL'U]
Anticardiolipin Ab, IgG: <10 [GPL'U]
Anticardiolipin Ab, IgM: 10 [MPL'U]
Antiphosphatidylserine IgG: 6 {GPS'U}
Antiphosphatidylserine IgM: 6 {MPS'U}
Antiprothrombin Antibody, IgG: 21 G units — ABNORMAL HIGH
Beta-2 Glycoprotein I, IgA: <10 SAU
Beta-2 Glycoprotein I, IgG: <10 SGU
Beta-2 Glycoprotein I, IgM: <10 SMU
DRVVT Confirm Seconds: 38.6 s
DRVVT Ratio: 1.2 ratio
DRVVT Screen Seconds: 50 s — ABNORMAL HIGH
Hexagonal Phospholipid Neutral: 5 s
Platelet Neutralization: 0 s

## 2017-11-02 LAB — TSH+FREE T4
Free T4: 1.17 ng/dL (ref 0.82–1.77)
TSH: 0.805 u[IU]/mL (ref 0.450–4.500)

## 2017-11-02 LAB — PROLACTIN: Prolactin: 18.1 ng/mL (ref 4.8–23.3)

## 2017-11-02 LAB — HGB A1C W/O EAG: Hgb A1c MFr Bld: 5.5 % (ref 4.8–5.6)

## 2017-11-08 ENCOUNTER — Telehealth: Payer: Self-pay

## 2017-11-08 NOTE — Telephone Encounter (Signed)
Pt requesting to speak to SDJ. No other details given.

## 2017-11-08 NOTE — Telephone Encounter (Signed)
Left generic VM 

## 2017-11-08 NOTE — Telephone Encounter (Signed)
Pt returning [place] Call.

## 2017-11-13 NOTE — Telephone Encounter (Signed)
Pt returning call to SDJ to discuss results.

## 2017-11-20 ENCOUNTER — Encounter: Payer: Self-pay | Admitting: Obstetrics and Gynecology

## 2017-11-20 NOTE — Telephone Encounter (Signed)
Pt called nurse line requesting a call from [place].  [phone]

## 2017-11-26 ENCOUNTER — Telehealth: Payer: Self-pay

## 2017-11-26 DIAGNOSIS — N96 Recurrent pregnancy loss: Secondary | ICD-10-CM

## 2017-11-26 NOTE — Telephone Encounter (Signed)
Pt requests cb from SDJ. Cb#[phone]

## 2017-11-26 NOTE — Telephone Encounter (Signed)
Spoke with patient. All labs essentially normal (aPTT slightly elevated and will recheck in a few months). Recommended next step is SIS to see if fibroid has submucosal component for easy removal.   Patient voiced understanding and desire to proceed with the procedure.   Order placed and patient will call to schedule.

## 2017-11-26 NOTE — Telephone Encounter (Addendum)
Left generic VM. I have attempted to reach her on a couple of other occasions and we have exchanged messages on MyChart.

## 2017-11-27 NOTE — Telephone Encounter (Signed)
Please get scheduled for pt

## 2017-11-27 NOTE — Telephone Encounter (Signed)
Forwarding message to [facility] to schedule appointment

## 2017-12-09 ENCOUNTER — Other Ambulatory Visit: Payer: Self-pay | Admitting: Obstetrics and Gynecology

## 2017-12-09 DIAGNOSIS — N96 Recurrent pregnancy loss: Secondary | ICD-10-CM

## 2017-12-10 ENCOUNTER — Ambulatory Visit (INDEPENDENT_AMBULATORY_CARE_PROVIDER_SITE_OTHER): Payer: 59

## 2017-12-10 ENCOUNTER — Encounter: Payer: Self-pay | Admitting: Obstetrics and Gynecology

## 2017-12-10 ENCOUNTER — Ambulatory Visit (INDEPENDENT_AMBULATORY_CARE_PROVIDER_SITE_OTHER): Payer: 59 | Admitting: Obstetrics and Gynecology

## 2017-12-10 DIAGNOSIS — N96 Recurrent pregnancy loss: Secondary | ICD-10-CM

## 2017-12-10 DIAGNOSIS — D259 Leiomyoma of uterus, unspecified: Secondary | ICD-10-CM

## 2017-12-11 ENCOUNTER — Encounter: Payer: Self-pay | Admitting: Obstetrics and Gynecology

## 2017-12-11 DIAGNOSIS — D259 Leiomyoma of uterus, unspecified: Secondary | ICD-10-CM | POA: Insufficient documentation

## 2017-12-11 NOTE — Progress Notes (Signed)
   Procedure Saline Infusion Sonohysterogram:  After obtaining informed consent, the patient was placed in the dorsal supine lithotomy position.  A bivalved speculum was placed in her vagina with visualization of the cervix.  The cervix was prepped x 2 with betadine.  A single tooth tenaculum was utilized to place the catheter.  A small sonohysterogram catheter was threaded through the cervix.  The speculum was removed from the vagina.  The transvaginal ultrasound probe was placed in the vagina.  A total of 7 mL of sterile saline was injected in the uterus with capture and image reconstruction using 3-D ultrasound.  The patient tolerated the procedure well.    The uterine cavity demonstrated an abnormality of contour where the anterior fibroid was located.   See ultrasound report for complete ultrasound details.   Prentice Docker, MD 12/11/2017 3:21 PM

## 2017-12-11 NOTE — Progress Notes (Signed)
   Gynecology Ultrasound Follow Up  Chief Complaint: Follow up ultrasound, recurrent miscarriage  History of Present Illness: Patient is a 37 y.o. female who presents today for ultrasound evaluation of the above .  Ultrasound demonstrates the following findings Adnexa: no masses seen  Uterus: anteverted with endometrial stripe  5.1 mm Additional: fibroids noted: 1) anterior fundal (intramural) - 3.1 x 2.9 cm 2) posterior fundal (subserosal) - 1.6 x 1.35 cm 3) posterior body (subserosal) - 1.6 x 1 cm  SIS performed (see report) with noted impingement of anterior fibroid on cavity.   Past Medical History:  Diagnosis Date  . Bacterial vaginitis   . Dysmenorrhea   . Hydrosalpinx   . Spontaneous abortion   . Trichomoniasis     Past Surgical History:  Procedure Laterality Date  . LASIK     Family History  Problem Relation Age of Onset  . Rheum arthritis Mother   . Hypothyroidism Mother   . Hypertension Father   . Breast cancer Maternal Grandmother     Social History   Socioeconomic History  . Marital status: Unknown    Spouse name: Not on file  . Number of children: Not on file  . Years of education: Not on file  . Highest education level: Not on file  Social Needs  . Financial resource strain: Not on file  . Food insecurity - worry: Not on file  . Food insecurity - inability: Not on file  . Transportation needs - medical: Not on file  . Transportation needs - non-medical: Not on file  Occupational History  . Not on file  Tobacco Use  . Smoking status: Never Smoker  . Smokeless tobacco: Never Used  Substance and Sexual Activity  . Alcohol use: No  . Drug use: Not on file  . Sexual activity: Yes    Birth control/protection: None  Other Topics Concern  . Not on file  Social History Narrative  . Not on file   Allergies: No Known Allergies  Medications   None   Physical Exam LMP 09/28/2017    General: NAD HEENT: normocephalic, anicteric Pulmonary: No  increased work of breathing Extremities: no edema, erythema, or tenderness Neurologic: Grossly intact, normal gait Psychiatric: mood appropriate, affect full   Assessment: 37 y.o. G2P0020 here in follow of ultrasound and sonohysterogram for  1. History of recurrent miscarriages   2. Uterine leiomyoma, unspecified location     Plan: Problem List Items Addressed This Visit      Genitourinary   Fibroid uterus     Other   History of recurrent miscarriages - Primary     Discussed recurrent miscarriages in setting of the findings today. Options for treatment include; attempt pregnancy with no treatment, treatment with depo lupron to shrink the fibroid, then attempt pregnancy, attempt removal of intracavitary portion of anterior fibroid with risk of abnormal placentation over area where fibroid was removed, and complete myomectomy by abdominal approach.  We reviewed the risks and benefits of each.  Her husband is with her today and they are considering the options together.  They would like to discuss decide. They will let me know.  25 minutes spent in face to face discussion with > 50% spent in counseling, management, and coordination of care  of her recurrent miscarriages and fibroid uterus.   Prentice Docker, MD 12/11/2017 3:18 PM

## 2017-12-20 ENCOUNTER — Other Ambulatory Visit: Payer: Self-pay | Admitting: Obstetrics and Gynecology

## 2017-12-20 ENCOUNTER — Telehealth: Payer: Self-pay | Admitting: Obstetrics and Gynecology

## 2017-12-20 DIAGNOSIS — D259 Leiomyoma of uterus, unspecified: Secondary | ICD-10-CM

## 2017-12-20 MED ORDER — LEUPROLIDE ACETATE (PED) 11.25 MG IM KIT: 11.2500 mg | PACK | INTRAMUSCULAR | 1 refills | Status: DC

## 2017-12-20 MED ORDER — NORETHINDRONE ACETATE 5 MG PO TABS: 5.0000 mg | ORAL_TABLET | Freq: Every day | ORAL | 1 refills | Status: DC

## 2017-12-20 NOTE — Telephone Encounter (Signed)
Discussed alternative to submucosal myomectomy of using depo lupron (with norethindrone add back) to try to reduce the size of the anterior fibroid. Discussed pros and cons and comparison to surgical treatment. Patient would like to proceed with medication treatment. Will obtain ultrasound in three months to assess interval change in size of fibroid. She understands that there is no guarantee that the fibroid will change size.  She prefers a non-surgical approach, if it might yield similar or better results.   rx for lupron/norethindrone sent to her pharmacy of record.   Prentice Docker, MD 12/20/2017 8:23 PM

## 2017-12-25 DIAGNOSIS — R293 Abnormal posture: Secondary | ICD-10-CM | POA: Diagnosis not present

## 2017-12-25 DIAGNOSIS — M9903 Segmental and somatic dysfunction of lumbar region: Secondary | ICD-10-CM | POA: Diagnosis not present

## 2017-12-25 DIAGNOSIS — M545 Low back pain: Secondary | ICD-10-CM | POA: Diagnosis not present

## 2017-12-29 ENCOUNTER — Encounter: Payer: Self-pay | Admitting: Obstetrics and Gynecology

## 2017-12-30 DIAGNOSIS — J029 Acute pharyngitis, unspecified: Secondary | ICD-10-CM | POA: Diagnosis not present

## 2018-01-02 ENCOUNTER — Telehealth: Payer: Self-pay | Admitting: Obstetrics and Gynecology

## 2018-01-02 DIAGNOSIS — M545 Low back pain: Secondary | ICD-10-CM | POA: Diagnosis not present

## 2018-01-02 DIAGNOSIS — M9903 Segmental and somatic dysfunction of lumbar region: Secondary | ICD-10-CM | POA: Diagnosis not present

## 2018-01-02 DIAGNOSIS — R293 Abnormal posture: Secondary | ICD-10-CM | POA: Diagnosis not present

## 2018-01-02 NOTE — Telephone Encounter (Signed)
Patient returned the call, and is aware of H&P at [facility] on 01/17/18 @ 8:10am, Pre-admit Testing phone interview to be scheduled, and OR on 01/23/18. Patient is aware she may receive calls from the [facility]. Patient has my ext.

## 2018-01-02 NOTE — Telephone Encounter (Signed)
-----   Message from Will Bonnet, MD sent at 01/01/2018  1:49 PM EST ----- Regarding: Schedule Surgery Surgery Booking Request Patient Full Name:  MARGERT EDSALL  MRN: [id number]  DOB: [birth year]  Surgeon: Prentice Docker, MD  Requested Surgery Date and Time: when patient can Primary Diagnosis AND Code: uterine fibroid, recurrent pregnancy loss Secondary Diagnosis and Code:  Surgical Procedure: Hysteroscopy, dilation and curettage, submucosal myomectomy L&D Notification: No Admission Status: same day surgery Length of Surgery: 60 minutes Special Case Needs: myosure device H&P: tbd (date) Phone Interview???: yes Interpreter: Language:  Medical Clearance: no Special Scheduling Instructions: none

## 2018-01-02 NOTE — Telephone Encounter (Signed)
Lmtrc

## 2018-01-02 NOTE — Telephone Encounter (Signed)
-----   Message from Will Bonnet, MD sent at 01/01/2018  1:49 PM EST ----- Regarding: Schedule Surgery Surgery Booking Request Patient Full Name:  HUMA IMHOFF  MRN: [id number]  DOB: 12-Aug-1981  Surgeon: Prentice Docker, MD  Requested Surgery Date and Time: when patient can Primary Diagnosis AND Code: uterine fibroid, recurrent pregnancy loss Secondary Diagnosis and Code:  Surgical Procedure: Hysteroscopy, dilation and curettage, submucosal myomectomy L&D Notification: No Admission Status: same day surgery Length of Surgery: 60 minutes Special Case Needs: myosure device H&P: tbd (date) Phone Interview???: yes Interpreter: Language:  Medical Clearance: no Special Scheduling Instructions: none

## 2018-01-07 DIAGNOSIS — M9903 Segmental and somatic dysfunction of lumbar region: Secondary | ICD-10-CM | POA: Diagnosis not present

## 2018-01-07 DIAGNOSIS — R293 Abnormal posture: Secondary | ICD-10-CM | POA: Diagnosis not present

## 2018-01-07 DIAGNOSIS — M545 Low back pain: Secondary | ICD-10-CM | POA: Diagnosis not present

## 2018-01-09 DIAGNOSIS — R293 Abnormal posture: Secondary | ICD-10-CM | POA: Diagnosis not present

## 2018-01-09 DIAGNOSIS — M545 Low back pain: Secondary | ICD-10-CM | POA: Diagnosis not present

## 2018-01-09 DIAGNOSIS — M9903 Segmental and somatic dysfunction of lumbar region: Secondary | ICD-10-CM | POA: Diagnosis not present

## 2018-01-14 DIAGNOSIS — M545 Low back pain: Secondary | ICD-10-CM | POA: Diagnosis not present

## 2018-01-14 DIAGNOSIS — R293 Abnormal posture: Secondary | ICD-10-CM | POA: Diagnosis not present

## 2018-01-14 DIAGNOSIS — M9903 Segmental and somatic dysfunction of lumbar region: Secondary | ICD-10-CM | POA: Diagnosis not present

## 2018-01-16 DIAGNOSIS — R293 Abnormal posture: Secondary | ICD-10-CM | POA: Diagnosis not present

## 2018-01-16 DIAGNOSIS — M545 Low back pain: Secondary | ICD-10-CM | POA: Diagnosis not present

## 2018-01-16 DIAGNOSIS — M9903 Segmental and somatic dysfunction of lumbar region: Secondary | ICD-10-CM | POA: Diagnosis not present

## 2018-01-17 ENCOUNTER — Ambulatory Visit (INDEPENDENT_AMBULATORY_CARE_PROVIDER_SITE_OTHER): Payer: 59 | Admitting: Obstetrics and Gynecology

## 2018-01-17 ENCOUNTER — Encounter: Payer: Self-pay | Admitting: Obstetrics and Gynecology

## 2018-01-17 ENCOUNTER — Encounter
Admission: RE | Admit: 2018-01-17 | Discharge: 2018-01-17 | Disposition: A | Discharge status: Home / Self Care | Payer: 59 | Source: Ambulatory Visit | Attending: Obstetrics and Gynecology | Admitting: Obstetrics and Gynecology

## 2018-01-17 ENCOUNTER — Other Ambulatory Visit: Payer: Self-pay

## 2018-01-17 VITALS — BP 124/78 | Ht 67.0 in | Wt 192.0 lb

## 2018-01-17 DIAGNOSIS — N96 Recurrent pregnancy loss: Secondary | ICD-10-CM

## 2018-01-17 DIAGNOSIS — D259 Leiomyoma of uterus, unspecified: Secondary | ICD-10-CM

## 2018-01-17 NOTE — H&P (View-Only) (Signed)
Preoperative History and Physical  Toni Byrd is a 37 y.o. G2P0010 here for surgical management of fibroid uterus with recurrent pregnant loss.   No significant preoperative concerns.  History of Present Illness: 37 y.o. G60P0020 female who has a history of two prior pregnancy losses in the first trimester.  She has an anterior, intramural fibroid that measures about 2.5-3 cm in each dimension.  It is somewhat impinging on the uterine cavity.  She has had multiple methods of management discussed and has had discussion regarding the unknown effect the fibroid may be having.    Proposed surgery: hysteroscopy, dilation and curettage, and myomectomy  Past Medical History:  Diagnosis Date  . Bacterial vaginitis   . Dysmenorrhea   . Hydrosalpinx   . Spontaneous abortion   . Trichomoniasis    Past Surgical History:  Procedure Laterality Date  . LASIK     OB History  Gravida Para Term Preterm AB Living  2       1 0  SAB TAB Ectopic Multiple Live Births  1       0    # Outcome Date GA Lbr Len/2nd Weight Sex Delivery Anes PTL Lv  2 Gravida           1 SAB             Patient denies any other pertinent gynecologic issues.   Current Outpatient Medications on File Prior to Visit  Medication Sig Dispense Refill  . leuprolide (LUPRON) 11.25 MG KIT injection Inject 11.25 mg into the muscle every 3 (three) months. 1 kit 1  . norethindrone (AYGESTIN) 5 MG tablet Take 1 tablet (5 mg total) by mouth daily. (Patient not taking: Reported on 01/07/2018) 90 tablet 1   No current facility-administered medications on file prior to visit.    No Known Allergies  Social History:   reports that  has never smoked. she has never used smokeless tobacco. She reports that she does not drink alcohol.  Family History  Problem Relation Age of Onset  . Rheum arthritis Mother   . Hypothyroidism Mother   . Hypertension Father   . Breast cancer Maternal Grandmother    Review of Systems    Constitutional: Negative.   HENT: Negative.   Eyes: Negative.   Respiratory: Negative.   Cardiovascular: Negative.   Gastrointestinal: Negative.   Genitourinary: Negative.   Musculoskeletal: Negative.   Skin: Negative.   Neurological: Negative.   Psychiatric/Behavioral: Negative.    PHYSICAL EXAM: Blood pressure 124/78, height 5' 7" (1.702 m), weight 192 lb (87.1 kg), last menstrual period 12/29/2017, unknown if currently breastfeeding. Physical Exam  Constitutional: She is oriented to person, place, and time. She appears well-developed and well-nourished. No distress.  HENT:  Head: Normocephalic and atraumatic.  Eyes: Conjunctivae are normal.  Neck: Normal range of motion. Neck supple. No thyromegaly present.  Cardiovascular: Normal rate, regular rhythm and normal heart sounds. Exam reveals no gallop and no friction rub.  No murmur heard. Pulmonary/Chest: Effort normal and breath sounds normal. She has no wheezes.  Abdominal: Soft. She exhibits no distension and no mass. There is no tenderness. There is no rebound and no guarding. No hernia.  Genitourinary: Pelvic exam was performed with patient supine.  Musculoskeletal: Normal range of motion.  Lymphadenopathy:    She has no cervical adenopathy.       Right: No inguinal adenopathy present.       Left: No inguinal adenopathy present.  Neurological: She is alert  and oriented to person, place, and time.  Skin: Skin is warm and dry. No rash noted.  Psychiatric: She has a normal mood and affect. Her behavior is normal. Judgment normal.   Assessment: Patient Active Problem List   Diagnosis Date Noted  . Fibroid uterus 12/11/2017  . History of recurrent miscarriages 08/19/2017   Plan: Patient will undergo surgical management with fibroid uterus and recurrent miscarriages (two).   The risks of surgery were discussed in detail with the patient including but not limited to: bleeding which may require transfusion or reoperation;  infection which may require antibiotics; injury to surrounding organs which may involve bowel, bladder, ureters ; need for additional procedures including laparoscopy or laparotomy; thromboembolic phenomenon, surgical site problems and other postoperative/anesthesia complications. Likelihood of success in alleviating the patient's condition was discussed. Routine postoperative instructions will be reviewed with the patient and her family in detail after surgery.  The patient concurred with the proposed plan, giving informed written consent for the surgery.  Preoperative prophylactic antibiotics and SCDs ordered on call to the OR.    Prentice Docker, MD 01/17/2018 8:17 AM

## 2018-01-17 NOTE — Progress Notes (Signed)
 Preoperative History and Physical  Toni Byrd is a 37 y.o. G2P0010 here for surgical management of fibroid uterus with recurrent pregnant loss.   No significant preoperative concerns.  History of Present Illness: 37 y.o. G2P0020 Toni Byrd who has a history of two prior pregnancy losses in the first trimester.  She has an anterior, intramural fibroid that measures about 2.5-3 cm in each dimension.  It is somewhat impinging on the uterine cavity.  She has had multiple methods of management discussed and has had discussion regarding the unknown effect the fibroid may be having.    Proposed surgery: hysteroscopy, dilation and curettage, and myomectomy  Past Medical History:  Diagnosis Date  . Bacterial vaginitis   . Dysmenorrhea   . Hydrosalpinx   . Spontaneous abortion   . Trichomoniasis    Past Surgical History:  Procedure Laterality Date  . LASIK     OB History  Gravida Para Term Preterm AB Living  2       1 0  SAB TAB Ectopic Multiple Live Births  1       0    # Outcome Date GA Lbr Len/2nd Weight Sex Delivery Anes PTL Lv  2 Gravida           1 SAB             Patient denies any other pertinent gynecologic issues.   Current Outpatient Medications on File Prior to Visit  Medication Sig Dispense Refill  . leuprolide (LUPRON) 11.25 MG KIT injection Inject 11.25 mg into the muscle every 3 (three) months. 1 kit 1  . norethindrone (AYGESTIN) 5 MG tablet Take 1 tablet (5 mg total) by mouth daily. (Patient not taking: Reported on 01/07/2018) 90 tablet 1   No current facility-administered medications on file prior to visit.    No Known Allergies  Social History:   reports that  has never smoked. she has never used smokeless tobacco. She reports that she does not drink alcohol.  Family History  Problem Relation Age of Onset  . Rheum arthritis Mother   . Hypothyroidism Mother   . Hypertension Father   . Breast cancer Maternal Grandmother    Review of Systems    Constitutional: Negative.   HENT: Negative.   Eyes: Negative.   Respiratory: Negative.   Cardiovascular: Negative.   Gastrointestinal: Negative.   Genitourinary: Negative.   Musculoskeletal: Negative.   Skin: Negative.   Neurological: Negative.   Psychiatric/Behavioral: Negative.    PHYSICAL EXAM: Blood pressure 124/78, height 5' 7" (1.702 m), weight 192 lb (87.1 kg), last menstrual period 12/29/2017, unknown if currently breastfeeding. Physical Exam  Constitutional: She is oriented to person, place, and time. She appears well-developed and well-nourished. No distress.  HENT:  Head: Normocephalic and atraumatic.  Eyes: Conjunctivae are normal.  Neck: Normal range of motion. Neck supple. No thyromegaly present.  Cardiovascular: Normal rate, regular rhythm and normal heart sounds. Exam reveals no gallop and no friction rub.  No murmur heard. Pulmonary/Chest: Effort normal and breath sounds normal. She has no wheezes.  Abdominal: Soft. She exhibits no distension and no mass. There is no tenderness. There is no rebound and no guarding. No hernia.  Genitourinary: Pelvic exam was performed with patient supine.  Musculoskeletal: Normal range of motion.  Lymphadenopathy:    She has no cervical adenopathy.       Right: No inguinal adenopathy present.       Left: No inguinal adenopathy present.  Neurological: She is alert   and oriented to person, place, and time.  Skin: Skin is warm and dry. No rash noted.  Psychiatric: She has a normal mood and affect. Her behavior is normal. Judgment normal.   Assessment: Patient Active Problem List   Diagnosis Date Noted  . Fibroid uterus 12/11/2017  . History of recurrent miscarriages 08/19/2017   Plan: Patient will undergo surgical management with fibroid uterus and recurrent miscarriages (two).   The risks of surgery were discussed in detail with the patient including but not limited to: bleeding which may require transfusion or reoperation;  infection which may require antibiotics; injury to surrounding organs which may involve bowel, bladder, ureters ; need for additional procedures including laparoscopy or laparotomy; thromboembolic phenomenon, surgical site problems and other postoperative/anesthesia complications. Likelihood of success in alleviating the patient's condition was discussed. Routine postoperative instructions will be reviewed with the patient and her family in detail after surgery.  The patient concurred with the proposed plan, giving informed written consent for the surgery.  Preoperative prophylactic antibiotics and SCDs ordered on call to the OR.    Stephen Jackson, MD 01/17/2018 8:17 AM     

## 2018-01-17 NOTE — Patient Instructions (Signed)
Your procedure is scheduled on: 01-23-18  Report to Same Day Surgery 2nd floor medical mall [facility] Entrance-take elevator on left to 2nd floor.  Check in with surgery information desk.) To find out your arrival time please call [phone] between 1PM - 3PM on 01-22-18  Remember: Instructions that are not followed completely may result in serious medical risk, up to and including death, or upon the discretion of your surgeon and anesthesiologist your surgery may need to be rescheduled.    _x___ 1. Do not eat food after midnight the night before your procedure. You may drink clear liquids up to 2 hours before you are scheduled to arrive at the hospital for your procedure.  Do not drink clear liquids within 2 hours of your scheduled arrival to the hospital.  Clear liquids include  --Water or Apple juice without pulp  --Clear carbohydrate beverage such as ClearFast or Gatorade  --Black Coffee or Clear Tea (No milk, no creamers, do not add anything to the coffee or Tea Type 1 and type 2 diabetics should only drink water.  No gum chewing or hard candies.     __x__ 2. No Alcohol for 24 hours before or after surgery.   __x__3. No Smoking or e-cigarettes for 24 prior to surgery.  Do not use any chewable tobacco products for at least 6 hour prior to surgery   ____  4. Bring all medications with you on the day of surgery if instructed.    __x__ 5. Notify your doctor if there is any change in your medical condition     (cold, fever, infections).    x___6. On the morning of surgery brush your teeth with toothpaste and water.  You may rinse your mouth with mouth wash if you wish.  Do not swallow any toothpaste or mouthwash.   Do not wear jewelry, make-up, hairpins, clips or nail polish.  Do not wear lotions, powders, or perfumes. You may wear deodorant.  Do not shave 48 hours prior to surgery. Men may shave face and neck.  Do not bring valuables to the hospital.    [facility] is not  responsible for any belongings or valuables.               Contacts, dentures or bridgework may not be worn into surgery.  Leave your suitcase in the car. After surgery it may be brought to your room.  For patients admitted to the hospital, discharge time is determined by your treatment team.  _  Patients discharged the day of surgery will not be allowed to drive home.  You will need someone to drive you home and stay with you the night of your procedure.    Please read over the following fact sheets that you were given:   [facility] Preparing for Surgery and or MRSA Information   ____ Take anti-hypertensive listed below, cardiac, seizure, asthma,     anti-reflux and psychiatric medicines. These include:  1. NONE  2.  3.  4.  5.  6.  ____Fleets enema or Magnesium Citrate as directed.   ____ Use CHG Soap or sage wipes as directed on instruction sheet   ____ Use inhalers on the day of surgery and bring to hospital day of surgery  ____ Stop Metformin and Janumet 2 days prior to surgery.    ____ Take 1/2 of usual insulin dose the night before surgery and none on the morning surgery.   ____ Follow recommendations from Cardiologist, Pulmonologist or PCP  regarding stopping Aspirin, Coumadin, Plavix ,Eliquis, Effient, or Pradaxa, and Pletal.  ____Stop Anti-inflammatories such as Advil, Aleve, Ibuprofen, Motrin, Naproxen, Naprosyn, Goodies powders or aspirin products. OK to take Tylenol   ____ Stop supplements until after surgery.     ____ Bring C-Pap to the hospital.

## 2018-01-21 DIAGNOSIS — M545 Low back pain: Secondary | ICD-10-CM | POA: Diagnosis not present

## 2018-01-21 DIAGNOSIS — M9903 Segmental and somatic dysfunction of lumbar region: Secondary | ICD-10-CM | POA: Diagnosis not present

## 2018-01-21 DIAGNOSIS — R293 Abnormal posture: Secondary | ICD-10-CM | POA: Diagnosis not present

## 2018-01-23 ENCOUNTER — Encounter
Admission: RE | Disposition: A | Discharge status: Home / Self Care | Payer: Self-pay | Source: Ambulatory Visit | Attending: Obstetrics and Gynecology

## 2018-01-23 ENCOUNTER — Ambulatory Visit
Admission: RE | Admit: 2018-01-23 | Discharge: 2018-01-23 | Disposition: A | Discharge status: Home / Self Care | Payer: 59 | Source: Ambulatory Visit | Attending: Obstetrics and Gynecology | Admitting: Obstetrics and Gynecology

## 2018-01-23 ENCOUNTER — Encounter: Payer: Self-pay | Admitting: *Deleted

## 2018-01-23 ENCOUNTER — Ambulatory Visit: Payer: 59 | Admitting: Certified Registered"

## 2018-01-23 DIAGNOSIS — D251 Intramural leiomyoma of uterus: Secondary | ICD-10-CM | POA: Insufficient documentation

## 2018-01-23 DIAGNOSIS — N96 Recurrent pregnancy loss: Secondary | ICD-10-CM | POA: Insufficient documentation

## 2018-01-23 DIAGNOSIS — D259 Leiomyoma of uterus, unspecified: Secondary | ICD-10-CM | POA: Diagnosis not present

## 2018-01-23 HISTORY — PX: DILATATION & CURETTAGE/HYSTEROSCOPY WITH MYOSURE: SHX6511

## 2018-01-23 LAB — POCT PREGNANCY, URINE: Preg Test, Ur: NEGATIVE

## 2018-01-23 SURGERY — DILATATION & CURETTAGE/HYSTEROSCOPY WITH MYOSURE
Anesthesia: General | Wound class: Clean Contaminated

## 2018-01-23 MED ORDER — PROPOFOL 500 MG/50ML IV EMUL
INTRAVENOUS | Status: DC | PRN
  Administered 2018-01-23: 100 ug/kg/min via INTRAVENOUS

## 2018-01-23 MED ORDER — PROPOFOL 10 MG/ML IV BOLUS
INTRAVENOUS | Status: DC | PRN
  Administered 2018-01-23: 200 mg via INTRAVENOUS

## 2018-01-23 MED ORDER — SILVER NITRATE-POT NITRATE 75-25 % EX MISC
CUTANEOUS | Status: AC
  Filled 2018-01-23: qty 1

## 2018-01-23 MED ORDER — LIDOCAINE HCL (CARDIAC) 20 MG/ML IV SOLN
INTRAVENOUS | Status: DC | PRN
  Administered 2018-01-23: 100 mg via INTRAVENOUS

## 2018-01-23 MED ORDER — OXYCODONE HCL 5 MG/5ML PO SOLN: 5.0000 mg | Freq: Once | ORAL | Status: DC | PRN

## 2018-01-23 MED ORDER — LACTATED RINGERS IV SOLN: INTRAVENOUS | Status: DC

## 2018-01-23 MED ORDER — OXYCODONE HCL 5 MG PO TABS: 5.0000 mg | ORAL_TABLET | Freq: Once | ORAL | Status: DC | PRN

## 2018-01-23 MED ORDER — MIDAZOLAM HCL 2 MG/2ML IJ SOLN
INTRAMUSCULAR | Status: AC
  Filled 2018-01-23: qty 2

## 2018-01-23 MED ORDER — LACTATED RINGERS IV SOLN
INTRAVENOUS | Status: DC | PRN
  Administered 2018-01-23: 11:00:00 via INTRAVENOUS

## 2018-01-23 MED ORDER — FENTANYL CITRATE (PF) 100 MCG/2ML IJ SOLN
INTRAMUSCULAR | Status: AC
  Administered 2018-01-23: 25 ug via INTRAVENOUS
  Filled 2018-01-23: qty 2

## 2018-01-23 MED ORDER — PROPOFOL 10 MG/ML IV BOLUS
INTRAVENOUS | Status: AC
  Filled 2018-01-23: qty 20

## 2018-01-23 MED ORDER — DEXMEDETOMIDINE HCL 200 MCG/2ML IV SOLN
INTRAVENOUS | Status: DC | PRN
  Administered 2018-01-23 (×2): 8 ug via INTRAVENOUS

## 2018-01-23 MED ORDER — FENTANYL CITRATE (PF) 100 MCG/2ML IJ SOLN
INTRAMUSCULAR | Status: AC
  Filled 2018-01-23: qty 2

## 2018-01-23 MED ORDER — SEVOFLURANE IN SOLN
RESPIRATORY_TRACT | Status: AC
  Filled 2018-01-23: qty 250

## 2018-01-23 MED ORDER — FENTANYL CITRATE (PF) 100 MCG/2ML IJ SOLN
INTRAMUSCULAR | Status: DC | PRN
  Administered 2018-01-23 (×2): 50 ug via INTRAVENOUS

## 2018-01-23 MED ORDER — ONDANSETRON HCL 4 MG/2ML IJ SOLN
INTRAMUSCULAR | Status: DC | PRN
  Administered 2018-01-23: 4 mg via INTRAVENOUS

## 2018-01-23 MED ORDER — FAMOTIDINE 20 MG PO TABS
20.0000 mg | ORAL_TABLET | Freq: Once | ORAL | Status: AC
  Administered 2018-01-23: 20 mg via ORAL

## 2018-01-23 MED ORDER — FENTANYL CITRATE (PF) 100 MCG/2ML IJ SOLN
25.0000 ug | INTRAMUSCULAR | Status: DC | PRN
  Administered 2018-01-23 (×3): 25 ug via INTRAVENOUS

## 2018-01-23 MED ORDER — SILVER NITRATE-POT NITRATE 75-25 % EX MISC
CUTANEOUS | Status: DC | PRN
  Administered 2018-01-23: 4

## 2018-01-23 MED ORDER — KETOROLAC TROMETHAMINE 30 MG/ML IJ SOLN
INTRAMUSCULAR | Status: DC | PRN
  Administered 2018-01-23: 30 mg via INTRAVENOUS

## 2018-01-23 MED ORDER — MIDAZOLAM HCL 2 MG/2ML IJ SOLN
INTRAMUSCULAR | Status: DC | PRN
  Administered 2018-01-23: 2 mg via INTRAVENOUS

## 2018-01-23 MED ORDER — LACTATED RINGERS IV SOLN
INTRAVENOUS | Status: DC
  Administered 2018-01-23: 10:00:00 via INTRAVENOUS

## 2018-01-23 MED ORDER — IBUPROFEN 800 MG PO TABS: 800.0000 mg | ORAL_TABLET | Freq: Three times a day (TID) | ORAL | 0 refills | Status: DC | PRN

## 2018-01-23 MED ORDER — FAMOTIDINE 20 MG PO TABS
ORAL_TABLET | ORAL | Status: AC
  Administered 2018-01-23: 20 mg via ORAL
  Filled 2018-01-23: qty 1

## 2018-01-23 SURGICAL SUPPLY — 22 items
ABLATOR ENDOMETRIAL MYOSURE (ABLATOR) IMPLANT
BAG URINE DRAINAGE (UROLOGICAL SUPPLIES) IMPLANT
CANISTER SUC SOCK COL 7IN (MISCELLANEOUS) ×2 IMPLANT
CATH FOLEY 2WAY  5CC 16FR (CATHETERS)
CATH ROBINSON RED A/P 16FR (CATHETERS) ×2 IMPLANT
CATH URTH 16FR FL 2W BLN LF (CATHETERS) IMPLANT
DEVICE MYOSURE LITE (MISCELLANEOUS) IMPLANT
ELECT REM PT RETURN 9FT ADLT (ELECTROSURGICAL) ×2
ELECTRODE REM PT RTRN 9FT ADLT (ELECTROSURGICAL) ×1 IMPLANT
GLOVE BIO SURGEON STRL SZ7 (GLOVE) ×8 IMPLANT
GLOVE BIOGEL PI IND STRL 7.5 (GLOVE) ×4 IMPLANT
GLOVE BIOGEL PI INDICATOR 7.5 (GLOVE) ×4
GOWN STRL REUS W/ TWL LRG LVL3 (GOWN DISPOSABLE) ×1 IMPLANT
GOWN STRL REUS W/TWL LRG LVL3 (GOWN DISPOSABLE) ×1
IV LACTATED RINGER IRRG 3000ML (IV SOLUTION) ×1
IV LR IRRIG 3000ML ARTHROMATIC (IV SOLUTION) ×1 IMPLANT
KIT TURNOVER CYSTO (KITS) ×2 IMPLANT
PACK DNC HYST (MISCELLANEOUS) ×2 IMPLANT
PAD OB MATERNITY 4.3X12.25 (PERSONAL CARE ITEMS) ×2 IMPLANT
PAD PREP 24X41 OB/GYN DISP (PERSONAL CARE ITEMS) ×2 IMPLANT
TUBING CONNECTING 10 (TUBING) ×2 IMPLANT
TUBING HYSTEROSCOPY DOLPHIN (MISCELLANEOUS) ×2 IMPLANT

## 2018-01-23 NOTE — Anesthesia Postprocedure Evaluation (Signed)
Anesthesia Post Note  Patient: Toni Byrd  Procedure(s) Performed: DILATATION & CURETTAGE/HYSTEROSCOPY WITH MYOSURE,REMOVAL OF UTERINE LESION (N/A )  Patient location during evaluation: PACU Anesthesia Type: General Level of consciousness: awake and alert Pain management: pain level controlled Vital Signs Assessment: post-procedure vital signs reviewed and stable Respiratory status: spontaneous breathing, nonlabored ventilation, respiratory function stable and patient connected to nasal cannula oxygen Cardiovascular status: blood pressure returned to baseline and stable Postop Assessment: no apparent nausea or vomiting Anesthetic complications: no     Last Vitals:  Vitals:   01/23/18 1256 01/23/18 1315  BP: 107/68 103/60  Pulse: 60 (!) 52  Resp: 14 16  Temp: 36.5 C   SpO2: 100% 100%    Last Pain:  Vitals:   01/23/18 1256  TempSrc: Oral  PainSc:                  Precious Haws Piscitello

## 2018-01-23 NOTE — Interval H&P Note (Signed)
History and Physical Interval Note:  01/23/2018 10:54 AM  Toni Byrd  has presented today for surgery, with the diagnosis of uterine fibroid,recurrent pregnancy loss  The various methods of treatment have been discussed with the patient and family. After consideration of risks, benefits and other options for treatment, the patient has consented to  Procedure(s): Lueders (N/A) as a surgical intervention .  The patient's history has been reviewed, patient examined, no change in status, stable for surgery.  I have reviewed the patient's chart and labs.  Questions were answered to the patient's satisfaction.    Prentice Docker, MD, [facility], [facility] 01/23/2018 10:55 AM

## 2018-01-23 NOTE — Anesthesia Procedure Notes (Signed)
Procedure Name: LMA Insertion Date/Time: 01/23/2018 11:30 AM Performed by: Patience Musca., CRNA Pre-anesthesia Checklist: Patient identified, Patient being monitored, Timeout performed, Emergency Drugs available and Suction available Patient Re-evaluated:Patient Re-evaluated prior to induction Oxygen Delivery Method: Circle system utilized Preoxygenation: Pre-oxygenation with 100% oxygen Induction Type: IV induction Ventilation: Mask ventilation without difficulty LMA: LMA inserted LMA Size: 4.0 Number of attempts: 1 Placement Confirmation: positive ETCO2 and breath sounds checked- equal and bilateral Tube secured with: Tape Dental Injury: Teeth and Oropharynx as per pre-operative assessment

## 2018-01-23 NOTE — Addendum Note (Signed)
Addendum  created 01/23/18 1453 by Hedda Slade, CRNA   Charge Capture section accepted

## 2018-01-23 NOTE — Anesthesia Preprocedure Evaluation (Signed)
Anesthesia Evaluation  Patient identified by MRN, date of birth, ID band Patient awake    Reviewed: Allergy & Precautions, H&P , NPO status , Patient's Chart, lab work & pertinent test results  History of Anesthesia Complications Negative for: history of anesthetic complications  Airway Mallampati: II  TM Distance: >3 FB Neck ROM: full    Dental  (+) Chipped   Pulmonary neg pulmonary ROS, neg shortness of breath,           Cardiovascular Exercise Tolerance: Good (-) angina(-) Past MI and (-) DOE negative cardio ROS       Neuro/Psych negative neurological ROS  negative psych ROS   GI/Hepatic negative GI ROS, Neg liver ROS, neg GERD  ,  Endo/Other  negative endocrine ROS  Renal/GU      Musculoskeletal   Abdominal   Peds  Hematology negative hematology ROS (+)   Anesthesia Other Findings Past Medical History: No date: Bacterial vaginitis No date: Dysmenorrhea No date: Hydrosalpinx No date: Spontaneous abortion No date: Trichomoniasis  Past Surgical History: No date: LASIK  BMI    Body Mass Index:  30.07 kg/m      Reproductive/Obstetrics negative OB ROS                             Anesthesia Physical Anesthesia Plan  ASA: II  Anesthesia Plan: General   Post-op Pain Management:    Induction: Intravenous  PONV Risk Score and Plan: Ondansetron, Dexamethasone and Midazolam  Airway Management Planned: LMA  Additional Equipment:   Intra-op Plan:   Post-operative Plan: Extubation in OR  Informed Consent: I have reviewed the patients History and Physical, chart, labs and discussed the procedure including the risks, benefits and alternatives for the proposed anesthesia with the patient or authorized representative who has indicated his/her understanding and acceptance.   Dental Advisory Given  Plan Discussed with: Anesthesiologist, CRNA and Surgeon  Anesthesia Plan  Comments: (Patient consented for risks of anesthesia including but not limited to:  - adverse reactions to medications - damage to teeth, lips or other oral mucosa - sore throat or hoarseness - Damage to heart, brain, lungs or loss of life  Patient voiced understanding.)        Anesthesia Quick Evaluation

## 2018-01-23 NOTE — Anesthesia Post-op Follow-up Note (Signed)
Anesthesia QCDR form completed.        

## 2018-01-23 NOTE — Transfer of Care (Signed)
Immediate Anesthesia Transfer of Care Note  Patient: Toni Byrd  Procedure(s) Performed: DILATATION & CURETTAGE/HYSTEROSCOPY WITH MYOSURE,REMOVAL OF UTERINE LESION (N/A )  Patient Location: PACU  Anesthesia Type:General  Level of Consciousness: sedated  Airway & Oxygen Therapy: Patient Spontanous Breathing and Patient connected to face mask oxygen  Post-op Assessment: Report given to RN and Post -op Vital signs reviewed and stable  Post vital signs: Reviewed and stable  Last Vitals:  Vitals:   01/23/18 0944  BP: 116/69  Pulse: 72  Resp: 20  Temp: 36.5 C  SpO2: 99%    Last Pain:  Vitals:   01/23/18 0944  TempSrc: Tympanic      Patients Stated Pain Goal: 0 (17/40/81 [id number])  Complications: No apparent anesthesia complications

## 2018-01-23 NOTE — Op Note (Signed)
Operative Note   Corey KEYASHA MIAH  [id number]  01/23/2018  PRE-OP DIAGNOSIS:  1) recurrent pregnancy loss 2) uterine fibroid (intramural) impinging on uterine cavity   POST-OP DIAGNOSIS:  1) recurrent pregnancy loss 2) uterine fibroid (intramural) impinging on uterine cavity   SURGEON: Surgeon(s) and Role:    Will Bonnet, MD - Primary  PROCEDURE: Procedure(s): DILATATION & CURETTAGE/HYSTEROSCOPY WITH MYOSURE,REMOVAL OF ENDOMETRIAL LESION   ANESTHESIA: General LMA  ESTIMATED BLOOD LOSS: 10  DRAINS: none   TOTAL IV FLUIDS: 500 mL crystalloid  SPECIMENS: endometrial lesion  VTE PROPHYLAXIS: SCDs to the bilateral lower extremities  ANTIBIOTICS: none  FLUID DEFICIT: minimal  COMPLICATIONS: none  DISPOSITION: PACU - hemodynamically stable.  CONDITION: stable  FINDINGS: Exam under anesthesia revealed small, mobile anteverted uterus with no masses and bilateral adnexa without masses or fullness. Hysteroscopy revealed a uterine cavity with an approximately 1cm wide x 1cm deep endometrial lesion protruding into the endometrial cavity, There were normal appearing bilateral tubal ostia and normal appearing endocervical canal.  PROCEDURE IN DETAIL:  After informed consent was obtained, the patient was taken to the operating room where anesthesia was obtained without difficulty. The patient was positioned in the dorsal lithotomy position in candy cane stirrups.  The patient's bladder was catheterized with an-in-and out foley catheter.  The patient was examined under anesthesia, with the above noted findings.  The bi-valved speculum was placed inside the patient's vagina, and the the anterior lip of the cervix was grasped with the tenaculum.  The cervix was progressively dilated to a 7 mm Hegar dilator.  The hysteroscope was introduced, with the above noted findings.  The myosure device was attached to the hysteroscope and the lesion was removed until the contour of the uterine  cavity was even.   The hystersocope was removed and the uterine cavity.  Excellent hemostasis was noted, and all instruments were removed, with excellent hemostasis noted throughout after application of silver nitrate to the tenaculum sites.  She was then taken out of dorsal lithotomy.  The patient tolerated the procedure well.  Sponge, lap and needle counts were correct x2.  She was wearing SCDs throughout the case. No antibiotics were indicated. The patient was taken to recovery room in excellent condition.  Will Bonnet, MD, [place] 01/23/2018 11:52 AM

## 2018-01-23 NOTE — Discharge Instructions (Signed)

## 2018-01-24 LAB — SURGICAL PATHOLOGY

## 2018-01-27 NOTE — Addendum Note (Signed)
Addendum  created 01/27/18 1443 by Patience Musca., CRNA   Intraprocedure Attestations filed

## 2018-01-28 DIAGNOSIS — R293 Abnormal posture: Secondary | ICD-10-CM | POA: Diagnosis not present

## 2018-01-28 DIAGNOSIS — M545 Low back pain: Secondary | ICD-10-CM | POA: Diagnosis not present

## 2018-01-28 DIAGNOSIS — M9903 Segmental and somatic dysfunction of lumbar region: Secondary | ICD-10-CM | POA: Diagnosis not present

## 2018-01-30 DIAGNOSIS — R293 Abnormal posture: Secondary | ICD-10-CM | POA: Diagnosis not present

## 2018-01-30 DIAGNOSIS — M9903 Segmental and somatic dysfunction of lumbar region: Secondary | ICD-10-CM | POA: Diagnosis not present

## 2018-01-30 DIAGNOSIS — M545 Low back pain: Secondary | ICD-10-CM | POA: Diagnosis not present

## 2018-02-04 DIAGNOSIS — R293 Abnormal posture: Secondary | ICD-10-CM | POA: Diagnosis not present

## 2018-02-04 DIAGNOSIS — M9903 Segmental and somatic dysfunction of lumbar region: Secondary | ICD-10-CM | POA: Diagnosis not present

## 2018-02-04 DIAGNOSIS — M545 Low back pain: Secondary | ICD-10-CM | POA: Diagnosis not present

## 2018-02-06 DIAGNOSIS — M9903 Segmental and somatic dysfunction of lumbar region: Secondary | ICD-10-CM | POA: Diagnosis not present

## 2018-02-06 DIAGNOSIS — R293 Abnormal posture: Secondary | ICD-10-CM | POA: Diagnosis not present

## 2018-02-06 DIAGNOSIS — M545 Low back pain: Secondary | ICD-10-CM | POA: Diagnosis not present

## 2018-02-07 ENCOUNTER — Encounter: Payer: Self-pay | Admitting: Obstetrics and Gynecology

## 2018-02-07 ENCOUNTER — Ambulatory Visit (INDEPENDENT_AMBULATORY_CARE_PROVIDER_SITE_OTHER): Payer: 59 | Admitting: Obstetrics and Gynecology

## 2018-02-07 VITALS — BP 118/74 | Ht 67.0 in | Wt 194.0 lb

## 2018-02-07 DIAGNOSIS — Z09 Encounter for follow-up examination after completed treatment for conditions other than malignant neoplasm: Secondary | ICD-10-CM

## 2018-02-07 NOTE — Progress Notes (Signed)
   Postoperative Follow-up Patient presents post op from hysteroscopy, D&C, myomectomy 2weeks ago for fibroids.  Subjective: Eating a regular diet without difficulty. The patient is not having any pain.  Activity: normal activities of daily living. She specifically denies fevers, chills, nausea vomiting. She has normal bowel and bladder function.  Objective: Vitals:   02/07/18 0813  BP: 118/74   Vital Signs: BP 118/74   Ht 5\' 7"  (1.702 m)   Wt 194 lb (88 kg)   LMP 01/05/2018 (LMP Unknown)   BMI 30.38 kg/m  Constitutional: Well nourished, well developed female in no acute distress.  HEENT: normal Skin: Warm and dry.  Extremity: no edema  Abdomen: Soft, non-tender, normal bowel sounds; no bruits, organomegaly or masses.  Assessment: 37 y.o. s/p hysteroscopy, dilation and curettage, myomectomy progressing well  Plan: Patient has done well after surgery with no apparent complications.  I have discussed the post-operative course to date, and the expected progress moving forward.  The patient understands what complications to be concerned about.  I will see the patient in routine follow up, or sooner if needed.    Activity plan: No restriction.  Prentice Docker, MD 02/07/2018, 8:19 AM

## 2018-02-10 ENCOUNTER — Encounter: Payer: Self-pay | Admitting: Obstetrics and Gynecology

## 2018-02-11 DIAGNOSIS — M9903 Segmental and somatic dysfunction of lumbar region: Secondary | ICD-10-CM | POA: Diagnosis not present

## 2018-02-11 DIAGNOSIS — R293 Abnormal posture: Secondary | ICD-10-CM | POA: Diagnosis not present

## 2018-02-11 DIAGNOSIS — M545 Low back pain: Secondary | ICD-10-CM | POA: Diagnosis not present

## 2018-02-13 DIAGNOSIS — M545 Low back pain: Secondary | ICD-10-CM | POA: Diagnosis not present

## 2018-02-13 DIAGNOSIS — M9903 Segmental and somatic dysfunction of lumbar region: Secondary | ICD-10-CM | POA: Diagnosis not present

## 2018-02-13 DIAGNOSIS — R293 Abnormal posture: Secondary | ICD-10-CM | POA: Diagnosis not present

## 2018-02-19 DIAGNOSIS — R293 Abnormal posture: Secondary | ICD-10-CM | POA: Diagnosis not present

## 2018-02-19 DIAGNOSIS — M9903 Segmental and somatic dysfunction of lumbar region: Secondary | ICD-10-CM | POA: Diagnosis not present

## 2018-02-19 DIAGNOSIS — M545 Low back pain: Secondary | ICD-10-CM | POA: Diagnosis not present

## 2018-11-13 DIAGNOSIS — J069 Acute upper respiratory infection, unspecified: Secondary | ICD-10-CM | POA: Diagnosis not present

## 2018-11-13 DIAGNOSIS — B9789 Other viral agents as the cause of diseases classified elsewhere: Secondary | ICD-10-CM | POA: Diagnosis not present

## 2018-12-26 DIAGNOSIS — Z0184 Encounter for antibody response examination: Secondary | ICD-10-CM | POA: Diagnosis not present

## 2018-12-26 DIAGNOSIS — Z0000 Encounter for general adult medical examination without abnormal findings: Secondary | ICD-10-CM | POA: Diagnosis not present

## 2018-12-26 DIAGNOSIS — D649 Anemia, unspecified: Secondary | ICD-10-CM | POA: Diagnosis not present

## 2018-12-26 DIAGNOSIS — Z23 Encounter for immunization: Secondary | ICD-10-CM | POA: Diagnosis not present

## 2019-01-02 ENCOUNTER — Ambulatory Visit: Payer: 59 | Admitting: Family Medicine

## 2019-01-12 DIAGNOSIS — Z111 Encounter for screening for respiratory tuberculosis: Secondary | ICD-10-CM | POA: Diagnosis not present

## 2019-01-12 DIAGNOSIS — R3129 Other microscopic hematuria: Secondary | ICD-10-CM | POA: Diagnosis not present

## 2019-02-09 DIAGNOSIS — D649 Anemia, unspecified: Secondary | ICD-10-CM | POA: Diagnosis not present

## 2019-03-03 ENCOUNTER — Other Ambulatory Visit: Payer: Self-pay

## 2019-03-03 ENCOUNTER — Ambulatory Visit (INDEPENDENT_AMBULATORY_CARE_PROVIDER_SITE_OTHER): Payer: 59 | Admitting: Certified Nurse Midwife

## 2019-03-03 ENCOUNTER — Encounter: Payer: Self-pay | Admitting: Certified Nurse Midwife

## 2019-03-03 VITALS — BP 102/60 | Ht 67.0 in | Wt 200.0 lb

## 2019-03-03 DIAGNOSIS — O09299 Supervision of pregnancy with other poor reproductive or obstetric history, unspecified trimester: Secondary | ICD-10-CM | POA: Diagnosis not present

## 2019-03-03 DIAGNOSIS — R768 Other specified abnormal immunological findings in serum: Secondary | ICD-10-CM

## 2019-03-03 DIAGNOSIS — Z3201 Encounter for pregnancy test, result positive: Secondary | ICD-10-CM

## 2019-03-03 DIAGNOSIS — O09519 Supervision of elderly primigravida, unspecified trimester: Secondary | ICD-10-CM | POA: Diagnosis not present

## 2019-03-03 DIAGNOSIS — O09511 Supervision of elderly primigravida, first trimester: Secondary | ICD-10-CM

## 2019-03-03 DIAGNOSIS — Z3A01 Less than 8 weeks gestation of pregnancy: Secondary | ICD-10-CM

## 2019-03-03 DIAGNOSIS — N912 Amenorrhea, unspecified: Secondary | ICD-10-CM

## 2019-03-03 DIAGNOSIS — O09291 Supervision of pregnancy with other poor reproductive or obstetric history, first trimester: Secondary | ICD-10-CM

## 2019-03-03 DIAGNOSIS — O0990 Supervision of high risk pregnancy, unspecified, unspecified trimester: Secondary | ICD-10-CM | POA: Diagnosis not present

## 2019-03-03 LAB — POCT URINE PREGNANCY: Preg Test, Ur: POSITIVE — AB

## 2019-03-03 NOTE — Progress Notes (Addendum)
New Obstetric Patient H&P    Chief Complaint: "Desires prenatal care"   History of Present Illness: Patient is a 38 y.o. G33P0020 Black female, LMP 01/23/2019 presents with amenorrhea and positive home pregnancy test. Based on her  LMP, her EDD is Estimated Date of Delivery: 10/30/19 and her EGA is [redacted]w[redacted]d. Cycles are 5-7. days, regular, and occur approximately every : 28 days. Her last pap smear was 2 years ago (11/20/2016) and was NIL/negative HRHPV.    She had a urine pregnancy test which was positive on 02/25/2019.  Her last menstrual period was a little heavier in flow and lasted for  6 day(s). Since her LMP she claims she has experienced some spotting last week when she wiped. She did not have cramping with the spotting, but is now experiencing some cramping. She also has had some nausea and breast tenderness. Denies dysuria, vaginal discharge, or vulvar itching or irritation.  Her past medical history is remarkable for uterine fibroids. A year ago she had a D&C, hysteroscopy and myomectomy. She also was recently diagnosed with anemia and has begun taking iron supplements. Her prior pregnancies are notable for spontaneous abortions x 2. Both abortions were in the early first trimester in 10/2015 and 07/2017. A antiphospholipid antibody screen was done which revealed a mildly prolonged PTT and an elevated antiprothrombin antibody IGG. Blood type is O positive.  Since her LMP, she admits to the use of tobacco products  no She claims she has gained   2 pounds since the start of her pregnancy.  There are cats in the home in the home  no If yes NA She admits close contact with children on a regular basis  no  She has had chicken pox in the past yes She has had Tuberculosis exposures, symptoms, or previously tested positive for TB   no Current or past history of domestic violence. no  Genetic Screening/Teratology Counseling: (Includes patient, baby's father, or anyone in either family with:)    37. Patient's age >/= 74 at [facility]  yes 2. Thalassemia ([place], [place], [place], or Asian background): MCV<80  no 3. Neural tube defect (meningomyelocele, spina bifida, anencephaly)  no 4. Congenital heart defect  no  5. Down syndrome  no 6. Tay-Sachs (Jewish, [place])  no 7. Canavan's Disease  no 8. Sickle cell disease or trait (African)  no  9. Hemophilia or other blood disorders  no  10. Muscular dystrophy  no  11. Cystic fibrosis  no  12. Huntington's Chorea  no  13. Mental retardation/autism  no 14. Other inherited genetic or chromosomal disorder  no 15. Maternal metabolic disorder (DM, PKU, etc)  no 16. Patient or FOB with a child with a birth defect not listed above no  16a. Patient or FOB with a birth defect themselves no 17. Recurrent pregnancy loss, or stillbirth  yes  18. Any medications since LMP other than prenatal vitamins (include vitamins, supplements, OTC meds, drugs, alcohol)  Yes, ferrous sulfate 325 mgm and PNV 19. 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 Lives with someone with TB or TB exposed  no  2. Patient or partner has history of genital herpes  no 3. Rash or viral illness since LMP  no 4. History of STI (GC, CT, HPV, syphilis, HIV)  Yes, remote hx of  Chlamydia 5. History of recent travel :  no  Other pertinent information:  Yes, her husband Jaquelyn Bitter is age 21 and will be involved with her care.    Review of Systems:10 point review of systems negative unless otherwise noted in HPI  Past Medical History:  Past Medical History:  Diagnosis Date  . Bacterial vaginitis   . Dysmenorrhea   . Hydrosalpinx   . Spontaneous abortion   . Trichomoniasis     Past Surgical History:  Past Surgical History:  Procedure Laterality Date  . DILATATION & CURETTAGE/HYSTEROSCOPY WITH  MYOSURE N/A 01/23/2018   Procedure: DILATATION & CURETTAGE/HYSTEROSCOPY WITH MYOSURE,REMOVAL OF UTERINE LESION;  Surgeon: Will Bonnet, MD;  Location: [facility] ORS;  Service: Gynecology;  Laterality: N/A;  . LASIK      Gynecologic History: Patient's last menstrual period was 01/23/2019 (exact date).  Obstetric History: G3P0020  Family History:  Family History  Problem Relation Age of Onset  . Rheum arthritis Mother   . Hypothyroidism Mother   . Hypertension Mother   . Diabetes Father   . Prostate cancer Father 54  . Breast cancer Maternal Grandmother 80    Social History:  Social History   Socioeconomic History  . Marital status: Married    Spouse name: Jaquelyn Bitter  . Number of children: Not on file  . Years of education: Not on file  . Highest education level: Not on file  Occupational History  . Not on file  Social Needs  . Financial resource strain: Not on file  . Food insecurity:    Worry: Not on file    Inability: Not on file  . Transportation needs:    Medical: Not on file    Non-medical: Not on file  Tobacco Use  . Smoking status: Never Smoker  . Smokeless tobacco: Never Used  Substance and Sexual Activity  . Alcohol use: Not Currently    Comment: occ  . Drug use: No  . Sexual activity: Yes    Partners: Male    Birth control/protection: None  Lifestyle  . Physical activity:    Days per week: Not on file    Minutes per session: Not on file  . Stress: Not on file  Relationships  . Social connections:    Talks on phone: Not on file    Gets together: Not on file    Attends religious service: Not on file    Active member of club or organization: Not on file    Attends meetings of clubs or organizations: Not on file    Relationship status: Not on file  . Intimate partner violence:    Fear of current or ex partner: Not on file    Emotionally abused: Not on file    Physically abused: Not on file    Forced sexual activity: Not on file  Other Topics Concern   . Not on file  Social History Narrative  . Not on file    Allergies:  Allergies  Allergen Reactions  . Other Rash    ADHESIVE FROM BANDAIDS-"BURNS SKIN"    Medications: Current Outpatient Medications on File Prior to Visit  Medication Sig Dispense Refill  . ferrous sulfate 325 (65 FE) MG tablet Take 325 mg by mouth daily with breakfast.    . Prenatal Vit-Fe Fumarate-FA (MULTIVITAMIN-PRENATAL) 27-0.8 MG TABS tablet Take 1 tablet by mouth daily at 12 noon.     No current facility-administered medications on file prior to visit.  Physical Exam Vitals:BP 102/60   Ht 5\' 7"  (1.702 m)   Wt 200 lb (90.7 kg)   LMP 01/23/2019 (Exact Date)   BMI 31.32 kg/m  General: BF in NAD HEENT: normocephalic, anicteric Thyroid: no enlargement, no palpable nodules Pulmonary: No increased work of breathing, CTAB Breasts: soft, mildly tender, no masses, no skin or nipple changes, no nipple discharge. No axillary, supraclavicular, or infraclavicular LAN. Cardiovascular: RRR, without murmur Abdomen: soft, non-tender, non-distended.  Umbilicus without lesions.  No hepatomegaly or masses palpable. No evidence of hernia  Genitourinary:  External: Normal external female genitalia.  Normal urethral meatus, normal Bartholin's and Skene's glands.    Vagina: Normal vaginal mucosa, no evidence of prolapse.    Cervix:: posterior, closed, no bleeding  Uterus: AF, 8 week size, mobile, normal contour  Adnexa: ovaries non-enlarged, no adnexal masses  Rectal: deferred Extremities: no edema, erythema, or tenderness Neurologic: Grossly intact Psychiatric: mood appropriate, affect full   Assessment: 38 y.o. G3P0020 at [redacted]w[redacted]d presenting to initiate prenatal care Hx of SAB x 2 Hx of a mildly prolonged PTT and an elevated antiprothrombin antibody IGG. Fibroid uterus with hx of myomectomy BMI>30 AMA  Plan:  1)  NOB labs today, UDS, urine culture, hemoglobinopathy profile as well as antiphospholipid  panel 2) Take prenatal vitamins and iron supplement daily. 3) Nutrition, food safety (fish, cheese advisories, and high nitrite foods), expected weight gain, and exercise discussed. 4) Hospital and practice style discussed with cross coverage system.  5) Explained increased risk of chromosomal abnormalities with advancing maternal age. Genetic Screening, such as with 1st Trimester Screening, cell free fetal DNA, AFP testing, and Ultrasound, as well as with amniocentesis as appropriate, is discussed with patient. At the conclusion of today's visit patient undecided genetic testing 6) Discussed prenatal care and changes to providing prenatal care in setting of Covid 19 pandemic. 7) RTO in 1-2 weeks for dating scan and ROB. Will need to schedule early [place], CNM

## 2019-03-04 LAB — URINE DRUG PANEL 7
Amphetamines, Urine: NEGATIVE ng/mL
Barbiturate Quant, Ur: NEGATIVE ng/mL
Benzodiazepine Quant, Ur: NEGATIVE ng/mL
Cannabinoid Quant, Ur: NEGATIVE ng/mL
Cocaine (Metab.): NEGATIVE ng/mL
Opiate Quant, Ur: NEGATIVE ng/mL
PCP Quant, Ur: NEGATIVE ng/mL

## 2019-03-05 LAB — COAG STUDIES INTERP REPORT

## 2019-03-05 LAB — ANTIPHOSPHOLIPID SYNDROME EVAL, BLD
APTT PPP: 27 s (ref 22.9–30.2)
Anticardiolipin IgG: <9 GPL U/mL (ref 0–14)
Anticardiolipin IgM: <9 MPL U/mL (ref 0–12)
Beta-2 Glyco 1 IgM: <9 GPI IgM units (ref 0–32)
Beta-2 Glyco I IgG: <9 GPI IgG units (ref 0–20)
Dilute Viper Venom Time: 32.9 s (ref 0.0–47.0)
Hexagonal Phase Phospholipid: 0 s (ref 0–11)
INR: 1 (ref 0.9–1.1)
PT: 11.1 s (ref 9.6–11.5)
Thrombin Time: 16.3 s (ref 0.0–23.0)

## 2019-03-05 LAB — RPR+RH+ABO+RUB AB+AB SCR+CB...
Antibody Screen: NEGATIVE
HIV Screen 4th Generation wRfx: NONREACTIVE
Hematocrit: 38.3 % (ref 34.0–46.6)
Hemoglobin: 12.3 g/dL (ref 11.1–15.9)
Hepatitis B Surface Ag: NEGATIVE
MCH: 25.7 pg — ABNORMAL LOW (ref 26.6–33.0)
MCHC: 32.1 g/dL (ref 31.5–35.7)
MCV: 80 fL (ref 79–97)
Platelets: 285 10*3/uL (ref 150–450)
RBC: 4.78 x10E6/uL (ref 3.77–5.28)
RDW: 14.5 % (ref 11.7–15.4)
RPR Ser Ql: NONREACTIVE
Rh Factor: POSITIVE
Rubella Antibodies, IGG: 8.61 index (ref >0.99)
Varicella zoster IgG: 942 index (ref >165)
WBC: 7.3 10*3/uL (ref 3.4–10.8)

## 2019-03-05 LAB — URINE CULTURE

## 2019-03-05 LAB — HEMOGLOBINOPATHY EVALUATION
HGB C: 0 %
HGB S: 0 %
HGB VARIANT: 0 %
Hemoglobin A2 Quantitation: 2 % (ref 1.8–3.2)
Hemoglobin F Quantitation: 0 % (ref 0.0–2.0)
Hgb A: 98 % (ref 96.4–98.8)

## 2019-03-06 LAB — CHLAMYDIA/GONOCOCCUS/TRICHOMONAS, NAA
Chlamydia by NAA: NEGATIVE
Gonococcus by NAA: NEGATIVE
Trich vag by NAA: NEGATIVE

## 2019-03-18 ENCOUNTER — Ambulatory Visit (INDEPENDENT_AMBULATORY_CARE_PROVIDER_SITE_OTHER): Payer: 59 | Admitting: Maternal Newborn

## 2019-03-18 ENCOUNTER — Other Ambulatory Visit: Payer: Self-pay

## 2019-03-18 ENCOUNTER — Encounter: Payer: Self-pay | Admitting: Maternal Newborn

## 2019-03-18 ENCOUNTER — Ambulatory Visit (INDEPENDENT_AMBULATORY_CARE_PROVIDER_SITE_OTHER): Payer: 59

## 2019-03-18 VITALS — BP 100/58 | Wt 204.0 lb

## 2019-03-18 DIAGNOSIS — Z3A01 Less than 8 weeks gestation of pregnancy: Secondary | ICD-10-CM

## 2019-03-18 DIAGNOSIS — O09521 Supervision of elderly multigravida, first trimester: Secondary | ICD-10-CM

## 2019-03-18 DIAGNOSIS — O09299 Supervision of pregnancy with other poor reproductive or obstetric history, unspecified trimester: Secondary | ICD-10-CM

## 2019-03-18 DIAGNOSIS — O09511 Supervision of elderly primigravida, first trimester: Secondary | ICD-10-CM | POA: Diagnosis not present

## 2019-03-18 DIAGNOSIS — O0990 Supervision of high risk pregnancy, unspecified, unspecified trimester: Secondary | ICD-10-CM

## 2019-03-18 DIAGNOSIS — O09519 Supervision of elderly primigravida, unspecified trimester: Secondary | ICD-10-CM

## 2019-03-18 DIAGNOSIS — Z6830 Body mass index (BMI) 30.0-30.9, adult: Secondary | ICD-10-CM

## 2019-03-18 DIAGNOSIS — Z3687 Encounter for antenatal screening for uncertain dates: Secondary | ICD-10-CM | POA: Diagnosis not present

## 2019-03-18 LAB — POCT URINALYSIS DIPSTICK OB
Glucose, UA: NEGATIVE
POC,PROTEIN,UA: NEGATIVE

## 2019-03-18 NOTE — Patient Instructions (Signed)
 Hello,  Given the current COVID-19 pandemic, our practice is making changes in how we are providing care to our patients. We are limiting in-person visits for the safety of all of our patients.   As a practice, we have met to discuss the best way to minimize visits, but still provide excellent care to our expecting mothers.  We have decided on the following visit structure for low-risk pregnancies.  Initial Pregnancy visit will be conducted as a telephone or web visit.  Between 10-14 weeks  there will be one in-person visit for an ultrasound, lab work, and genetic screening. 20 weeks in-person visit with an anatomy ultrasound  28 weeks in-person office visit for a 1-hour glucose test and a TDAP vaccination 32 weeks in-person office visit 34 weeks telephone visit 36 weeks in-person office visit for GBS, chlamydia, and gonorrhea testing 38 weeks in-person office visit 40 weeks in-person office visit  Understandably, some patients will require more visits than what is outlined above. Additional visits will be determined on a case-by-case basis.   We will, as always, be available for emergencies or to address concerns that might arise between in-person visits. We ask that you allow us the opportunity to address any concerns over the phone or through a virtual visit first. We will be available to return your phone calls throughout the day.   If you are able to purchase a scale, a blood pressure machine, and a home fetal doppler visits could be limited further. This will help decrease your exposure risks, but these purchases are not a necessity.   Things seem to change daily and there is the possibility that this structure could change, please be patient as we adapt to a new way of caring for patients.   Thank you for trusting us with your prenatal care. Our practice values you and looks forward to providing you with excellent care.   Sincerely,   [facility], [facility]      COVID-19 and Your Pregnancy FAQ  How can I prevent infection with COVID-19 during my pregnancy? Social distancing is key. Please limit any interactions in public. Try and work from home if possible. Frequently wash your hands after touching possibly contaminated surfaces. Avoid touching your face.  Minimize trips to the store. Consider online ordering when possible.   Should I wear a mask? YES. It is recommended by the CDC that all people wear a cloth mask or facial covering in public. You should wear a mask to your visits in the office. This will help reduce transmission as well as your risk or acquiring COVID-19. New studies are showing that even asymptomatic individuals can spread the virus from talking.   Where can I get a mask? [place] and the city of [place] are partnering to provide masks to community members. You can pick up a mask from several locations. This website also has instructions about how to make a mask by sewing or without sewing by using a t-shirt or bandana.  https://www.[place]-.gov/i-want-to/learn-about/covid-19-information-and-updates/covid-19-face-mask-project  Studies have shown that if you were a tube or nylon stocking from pantyhose over a cloth mask it makes the cloth mask almost as effective as a N95 mask.  https://www.npr.org/sections/goatsandsoda/2019/03/04/840146830/adding-a-nylon-stocking-layer-could-boost-protection-from-cloth-masks-study-find  What are the symptoms of COVID-19? Fever (greater than 100.4 F), dry cough, shortness of breath.  Am I more at risk for COVID-19 since I am pregnant? There is not currently data showing that pregnant women are more adversely impacted by COVID-19 than the general population. However,   we know that pregnant women tend to have worse respiratory complications from similar diseases such as the flu and SARS and for this reason should be considered an at-risk population.  What do I do if I am  experiencing the symptoms of COVID-19? Testing is being limited because of test availability. If you are experiencing symptoms you should quarantine yourself, and the members of your family, for at least 2 weeks at home.   Please visit this website for more information: https://www.cdc.gov/coronavirus/2019-ncov/if-you-are-sick/steps-when-sick.html  When should I go to the Emergency Room? Please go to the emergency room if you are experiencing ANY of these symptoms*:  1.    Difficulty breathing or shortness of breath 2.    Persistent pain or pressure in the chest 3.    Confusion or difficulty being aroused (or awakened) 4.    Bluish lips or face  *This list is not all inclusive. Please consult our office for any other symptoms that are severe or concerning.  What do I do if I am having difficulty breathing? You should go to the Emergency Room for evaluation. At this time they have a tent set up for evaluating patients with COVID-19 symptoms.   How will my prenatal care be different because of the COVID-19 pandemic? It has been recommended to reduce the frequency of face-to-face visits and use resources such as telephone and virtual visits when possible. Using a scale, blood pressure machine and fetal doppler at home can further help reduce face-to-face visits. You will be provided with additional information on this topic.  We ask that you come to your visits alone to minimize potential exposures to  COVID-19.  How can I receive childbirth education? At this time in-person classes have been cancelled. You can register for online childbirth education, breastfeeding, and newborn care classes.  Please visit:  [email or web address] for more information  How will my hospital birth experience be different? The hospital is currently limiting visitors. This means that while you are in labor you can only have one person at the hospital with you. Additional family members will not be allowed  to wait in the building or outside your room. Your one support person can be the father of the baby, a relative, a doula, or a friend. Once one support person is designated that person will wear a band. This band cannot be shared with multiple people.  Nitrous Gas is not being offered for pain relief since the tubing and filter for the machine can not be sanitized in a way to guarantee prevention of transmission of COVID-19.  Nasal cannula use of oxygen for fetal indications has also been discontinued.  Currently a clear plastic sheet is being hung between mom and the delivering provider during pushing and delivery to help prevent transmission of COVID-19.      How long will I stay in the hospital for after giving birth? It is also recommended that discharge home be expedited during the COVID-19 outbreak. This means staying for 1 day after a vaginal delivery and 2 days after a cesarean section. Patients who need to stay longer for medical reasons are allowed to do so, but the goal will be for expedited discharge home.   What if I have COVID-19 and I am in labor? We ask that you wear a mask while on labor and delivery. We will try and accommodate you being placed in a room that is capable of filtering the air. Please call ahead if you are in labor and on your   way to the hospital. The phone number for labor and delivery at [facility] is [phone].  If I have COVID-19 when my baby is born how can I prevent my baby from contracting COVID-19? This is an issue that will have to be discussed on a case-by-case basis. Current recommendations suggest providing separate isolation rooms for both the mother and new infant as well as limiting visitors. However, there are practical challenges to this recommendation. The situation will assuredly change and decisions will be influenced by the desires of the mother and availability of space.  Some suggestions are the use of a curtain or  physical barrier between mom and infant, hand hygiene, mom wearing a mask, or 6 feet of spacing between a mom and infant.   Can I breastfeed during the COVID-19 pandemic?   Yes, breastfeeding is encouraged.  Can I breastfeed if I have COVID-19? Yes. Covid-19 has not been found in breast milk. This means you cannot give COVID-19 to your child through breast milk. Breast feeding will also help pass antibodies to fight infection to your baby.   What precautions should I take when breastfeeding if I have COVID-19? If a mother and newborn do room-in and the mother wishes to feed at the breast, she should put on a facemask and practice hand hygiene before each feeding.  What precautions should I take when pumping if I have COVID-19? Prior to expressing breast milk, mothers should practice hand hygiene. After each pumping session, all parts that come into contact with breast milk should be thoroughly washed and the entire pump should be appropriately disinfected per the manufacturer's instructions. This expressed breast milk should be fed to the newborn by a healthy caregiver.  What if I am pregnant and work in healthcare? Based on limited data regarding COVID-19 and pregnancy, ACOG currently does not propose creating additional restrictions on pregnant health care personnel because of COVID-19 alone. Pregnant women do not appear to be at higher risk of severe disease related to COVID-19. Pregnant health care personnel should follow CDC risk assessment and infection control guidelines for health care personnel exposed to patients with suspected or confirmed COVID-19. Adherence to recommended infection prevention and control practices is an important part of protecting all health care personnel in health care settings.    Information on COVID-19 in pregnancy is very limited; however, facilities may want to consider limiting exposure of pregnant health care personnel to patients with confirmed or suspected  COVID-19 infection, especially during higher-risk procedures (eg, aerosol-generating procedures), if feasible, based on staffing availability.    

## 2019-03-18 NOTE — Progress Notes (Signed)
Dating scan today. No complaints 

## 2019-03-18 NOTE — Progress Notes (Signed)
    Routine Prenatal Care Visit  Subjective  Toni Byrd is a 38 y.o. G3P0020 at [redacted]w[redacted]d being seen today for ongoing prenatal care.  She is currently monitored for the following issues for this high-risk pregnancy and has History of recurrent miscarriages; Fibroid uterus; Advanced maternal age, primigravida, antepartum; and Supervision of high risk pregnancy, antepartum on their problem list.  ----------------------------------------------------------------------------------- Patient reports occasional lower back pain and a metallic taste in her mouth. Does not have nausea/vomiting with this. No urinary symptoms. Vag. Bleeding: None.  ----------------------------------------------------------------------------------- The following portions of the patient's history were reviewed and updated as appropriate: allergies, current medications, past family history, past medical history, past social history, past surgical history and problem list. Problem list updated.  Objective  Blood pressure (!) 100/58, weight 204 lb (92.5 kg), last menstrual period 01/23/2019. Pregravid weight 190 lb (86.2 kg) Total Weight Gain 14 lb (6.35 kg) Urinalysis: Urine dipstick shows negative for glucose, protein. Fetal Status: Fetal Heart Rate (bpm): 161         General:  Alert, oriented and cooperative. Patient is in no acute distress.  Skin: Skin is warm and dry. No rash noted.   Cardiovascular: Normal heart rate noted  Respiratory: Normal respiratory effort, no problems with respiration noted  Abdomen: Soft, gravid, appropriate for gestational age.       Pelvic:  Cervical exam deferred        Extremities: Normal range of motion.     Mental Status: Normal mood and affect. Normal behavior. Normal judgment and thought content.    Assessment   38 y.o. G3P0020 at [redacted]w[redacted]d, EDD 10/30/2019 by Last Menstrual Period presenting for a routine prenatal visit.  Plan   THIRD Problems (from 03/03/19 to present)    Problem Noted Resolved   Supervision of high risk pregnancy, antepartum 03/03/2019 by Dalia Heading, CNM No   Overview Signed 03/18/2019 10:56 AM by Rexene Agent, [place] Prenatal Labs  Dating  Blood type: O/Positive/-- (04/21 1439)   Genetic Screen 1 Screen:    AFP:     Quad:     NIPS: Antibody:Negative (04/21 1439)  Anatomic [place]  Rubella: 8.61 (04/21 1439) Varicella: Immune  GTT Early:               Third trimester:  RPR: Non Reactive (04/21 1439)   Rhogam  HBsAg: Negative (04/21 1439)   TDaP vaccine                       Flu Shot: HIV: Non Reactive (04/21 1439)   Baby Food                                GBS:   Contraception  Pap: 11/20/2016 NLIM/HPV Neg  CBB     CS/VBAC    Support Person               Ultrasound today shows singleton IUP with size=dates, FHR 161. Discussed with patient that EDD will remain the same based on this scan.   Has decided to proceed with MaterniT21. Will have this done next visit along with early glucola.  Please refer to After Visit Summary for other counseling recommendations.   Return in about 3 weeks (around 04/08/2019) for ROB and GTT/genetic screening.  [name], CNM 03/18/2019

## 2019-04-01 ENCOUNTER — Other Ambulatory Visit: Payer: Self-pay

## 2019-04-01 ENCOUNTER — Other Ambulatory Visit: Payer: 59

## 2019-04-01 DIAGNOSIS — Z6830 Body mass index (BMI) 30.0-30.9, adult: Secondary | ICD-10-CM

## 2019-04-01 DIAGNOSIS — O0990 Supervision of high risk pregnancy, unspecified, unspecified trimester: Secondary | ICD-10-CM

## 2019-04-02 LAB — GLUCOSE, 1 HOUR GESTATIONAL: Gestational Diabetes Screen: 154 mg/dL — ABNORMAL HIGH (ref 65–139)

## 2019-04-03 ENCOUNTER — Other Ambulatory Visit: Payer: Self-pay | Admitting: Maternal Newborn

## 2019-04-03 DIAGNOSIS — R7309 Other abnormal glucose: Secondary | ICD-10-CM

## 2019-04-03 NOTE — Progress Notes (Signed)
3 hour GTT ordered, MyChart message sent to patient.

## 2019-04-08 ENCOUNTER — Encounter: Payer: 59 | Admitting: Obstetrics and Gynecology

## 2019-04-08 ENCOUNTER — Other Ambulatory Visit: Payer: 59

## 2019-04-09 ENCOUNTER — Other Ambulatory Visit: Payer: Self-pay

## 2019-04-09 ENCOUNTER — Other Ambulatory Visit: Payer: 59

## 2019-04-09 ENCOUNTER — Encounter: Payer: Self-pay | Admitting: Obstetrics & Gynecology

## 2019-04-09 ENCOUNTER — Ambulatory Visit (INDEPENDENT_AMBULATORY_CARE_PROVIDER_SITE_OTHER): Payer: 59 | Admitting: Obstetrics & Gynecology

## 2019-04-09 ENCOUNTER — Encounter: Payer: 59 | Admitting: Obstetrics & Gynecology

## 2019-04-09 VITALS — BP 120/80 | Wt 204.0 lb

## 2019-04-09 DIAGNOSIS — Z3A10 10 weeks gestation of pregnancy: Secondary | ICD-10-CM

## 2019-04-09 DIAGNOSIS — O0990 Supervision of high risk pregnancy, unspecified, unspecified trimester: Secondary | ICD-10-CM

## 2019-04-09 DIAGNOSIS — O09521 Supervision of elderly multigravida, first trimester: Secondary | ICD-10-CM

## 2019-04-09 DIAGNOSIS — D259 Leiomyoma of uterus, unspecified: Secondary | ICD-10-CM

## 2019-04-09 DIAGNOSIS — O3411 Maternal care for benign tumor of corpus uteri, first trimester: Secondary | ICD-10-CM

## 2019-04-09 DIAGNOSIS — O09519 Supervision of elderly primigravida, unspecified trimester: Secondary | ICD-10-CM

## 2019-04-09 DIAGNOSIS — O09511 Supervision of elderly primigravida, first trimester: Secondary | ICD-10-CM

## 2019-04-09 NOTE — Progress Notes (Signed)
  Subjective  Fetal Movement? no Contractions? no Leaking Fluid? no Vaginal Bleeding? no  Objective  BP 120/80   Wt 204 lb (92.5 kg)   LMP 01/23/2019 (Exact Date)   BMI 31.95 kg/m  General: NAD Pumonary: no increased work of breathing Abdomen: gravid, non-tender Extremities: no edema Psychiatric: mood appropriate, affect full  Assessment  37 y.o. G3P0020 at [redacted]w[redacted]d by  10/30/2019, by Last Menstrual Period presenting for routine prenatal visit  Plan   Problem List Items Addressed This Visit      Genitourinary   Fibroid uterus    Risks of PTL, pain, bleeding discussed    Location of fibroid fundal, unlikely obstructive for labor    Prior myomectomy, but OK for vaginal delivery per pt   Supervision of high risk pregnancy, antepartum    Baby ASA    PNV   Advanced maternal age in first trimester     Relevant Orders   MaterniT21 PLUS Core+SCA   [redacted] weeks gestation of pregnancy          THIRD Problems (from 03/03/19 to present)    Problem Noted Resolved   Supervision of high risk pregnancy, antepartum 03/03/2019 by Dalia Heading, CNM No   Overview Addendum 03/18/2019 10:59 AM by Rexene Agent, [place] Prenatal Labs  Dating L=8 Blood type: O/Positive/-- (04/21 1439)   Genetic Screen NIPS: Antibody:Negative (04/21 1439)  Anatomic [place]  Rubella: 8.61 (04/21 1439) Varicella: Immune  GTT Early:high RPR: Non Reactive (04/21 1439)   Rhogam O+ HBsAg: Negative (04/21 1439)   TDaP vaccine                       Flu Shot: HIV: Non Reactive (04/21 1439)   Baby Food                                GBS:   Contraception  Pap: 11/20/2016 NLIM/HPV Neg  CBB     CS/VBAC    Support Person                  Barnett Applebaum, MD, [facility], [facility] 04/09/2019  2:14 PM

## 2019-04-13 ENCOUNTER — Other Ambulatory Visit: Payer: Self-pay

## 2019-04-13 ENCOUNTER — Other Ambulatory Visit: Payer: 59

## 2019-04-13 DIAGNOSIS — R7309 Other abnormal glucose: Secondary | ICD-10-CM

## 2019-04-14 LAB — MATERNIT21 PLUS CORE+SCA
Fetal Fraction: 5
Monosomy X (Turner Syndrome): NOT DETECTED
Result (T21): NEGATIVE
Trisomy 13 (Patau syndrome): NEGATIVE
Trisomy 18 (Edwards syndrome): NEGATIVE
Trisomy 21 (Down syndrome): NEGATIVE
XXX (Triple X Syndrome): NOT DETECTED
XXY (Klinefelter Syndrome): NOT DETECTED
XYY (Jacobs Syndrome): NOT DETECTED

## 2019-04-14 LAB — GESTATIONAL GLUCOSE TOLERANCE
Glucose, Fasting: 85 mg/dL (ref 65–94)
Glucose, GTT - 1 Hour: 172 mg/dL (ref 65–179)
Glucose, GTT - 2 Hour: 167 mg/dL — ABNORMAL HIGH (ref 65–154)
Glucose, GTT - 3 Hour: 101 mg/dL (ref 65–139)

## 2019-04-21 ENCOUNTER — Telehealth: Payer: Self-pay

## 2019-04-21 NOTE — Telephone Encounter (Signed)
[facility] returning a call from [place] regarding a PA on genetic testing. CB# [phone]

## 2019-04-21 NOTE — Telephone Encounter (Signed)
[facility] and ask for [place].

## 2019-04-22 NOTE — Telephone Encounter (Signed)
Left second msg to call me back.

## 2019-04-23 NOTE — Telephone Encounter (Signed)
Left detailed msg for LC.

## 2019-04-23 NOTE — Telephone Encounter (Signed)
Jona from [facility] left another msg stating to login into [facility] Portal or call [phone] which I did.  Spoke c Sam.  PA approved for [employer].  Case # [id number].

## 2019-05-07 ENCOUNTER — Other Ambulatory Visit: Payer: Self-pay

## 2019-05-07 ENCOUNTER — Encounter: Payer: Self-pay | Admitting: Obstetrics and Gynecology

## 2019-05-07 ENCOUNTER — Ambulatory Visit (INDEPENDENT_AMBULATORY_CARE_PROVIDER_SITE_OTHER): Payer: 59 | Admitting: Obstetrics and Gynecology

## 2019-05-07 VITALS — BP 104/70 | Wt 203.4 lb

## 2019-05-07 DIAGNOSIS — N96 Recurrent pregnancy loss: Secondary | ICD-10-CM

## 2019-05-07 DIAGNOSIS — O0992 Supervision of high risk pregnancy, unspecified, second trimester: Secondary | ICD-10-CM

## 2019-05-07 DIAGNOSIS — O0990 Supervision of high risk pregnancy, unspecified, unspecified trimester: Secondary | ICD-10-CM

## 2019-05-07 DIAGNOSIS — O09522 Supervision of elderly multigravida, second trimester: Secondary | ICD-10-CM

## 2019-05-07 DIAGNOSIS — Z3A14 14 weeks gestation of pregnancy: Secondary | ICD-10-CM

## 2019-05-07 DIAGNOSIS — O09519 Supervision of elderly primigravida, unspecified trimester: Secondary | ICD-10-CM

## 2019-05-07 DIAGNOSIS — O3412 Maternal care for benign tumor of corpus uteri, second trimester: Secondary | ICD-10-CM

## 2019-05-07 DIAGNOSIS — D259 Leiomyoma of uterus, unspecified: Secondary | ICD-10-CM

## 2019-05-07 LAB — POCT URINALYSIS DIPSTICK OB: Glucose, UA: NEGATIVE

## 2019-05-07 NOTE — Progress Notes (Signed)
Routine Prenatal Care Visit  Subjective  Toni Byrd is a 38 y.o. G3P0020 at [redacted]w[redacted]d being seen today for ongoing prenatal care.  She is currently monitored for the following issues for this high-risk pregnancy and has History of recurrent miscarriages; Fibroid uterus; Advanced maternal age, primigravida, antepartum; Supervision of high risk pregnancy, antepartum; and Uterine fibroids affecting pregnancy on their problem list.  ----------------------------------------------------------------------------------- Patient reports no complaints.   Contractions: Not present. Vag. Bleeding: None.  Movement: Absent. Denies leaking of fluid.  ----------------------------------------------------------------------------------- The following portions of the patient's history were reviewed and updated as appropriate: allergies, current medications, past family history, past medical history, past social history, past surgical history and problem list. Problem list updated.   Objective  Blood pressure 104/70, weight 203 lb 6.4 oz (92.3 kg), last menstrual period 01/23/2019, unknown if currently breastfeeding. Pregravid weight 190 lb (86.2 kg) Total Weight Gain 13 lb 6.4 oz (6.078 kg) Urinalysis: Urine Protein Trace  Urine Glucose Negative  Fetal Status: Fetal Heart Rate (bpm): 159   Movement: Absent     General:  Alert, oriented and cooperative. Patient is in no acute distress.  Skin: Skin is warm and dry. No rash noted.   Cardiovascular: Normal heart rate noted  Respiratory: Normal respiratory effort, no problems with respiration noted  Abdomen: Soft, gravid, appropriate for gestational age. Pain/Pressure: Absent     Pelvic:  Cervical exam deferred        Extremities: Normal range of motion.  Edema: None  Mental Status: Normal mood and affect. Normal behavior. Normal judgment and thought content.   Assessment   38 y.o. G3P0020 at [redacted]w[redacted]d by  10/30/2019, by Last Menstrual Period presenting for  routine prenatal visit  Plan   THIRD Problems (from 03/03/19 to present)    Problem Noted Resolved   Uterine fibroids affecting pregnancy 04/09/2019 by Gae Dry, MD No   Overview Signed 04/09/2019  2:17 PM by Gae Dry, MD       Risks of PTL, pain, bleeding discussed    Location of fibroid fundal, unlikely obstructive for labor    Prior myomectomy, but OK for vaginal delivery per pt        Supervision of high risk pregnancy, antepartum 03/03/2019 by Dalia Heading, CNM No   Overview Addendum 04/09/2019  2:19 PM by Gae Dry, MD    Clinic [facility] Prenatal Labs  Dating L=8 Blood type: O/Positive/-- (04/21 1439)   Genetic Screen NIPS: Antibody:Negative (04/21 1439)  Anatomic [place]  Rubella: 8.61 (04/21 1439) Varicella: Im  GTT Early:high;  3 hr GTT:         Third trimester:  RPR: Non Reactive (04/21 1439)   Rhogam O+ HBsAg: Negative (04/21 1439)   TDaP vaccine                       Flu Shot: HIV: Non Reactive (04/21 1439)   Baby Food                                GBS:   Contraception  Pap: 11/20/2016 NLIM/HPV Neg  CBB  No   CS/VBAC Vaginal labor ok (prior myomectomy)   Support Person                  Preterm labor symptoms and general obstetric precautions including but not limited to vaginal bleeding, contractions, leaking of fluid and fetal movement were reviewed in  detail with the patient. Please refer to After Visit Summary for other counseling recommendations.   Return in about 4 weeks (around 06/04/2019) for U/S for anatomy and routine prenatal.  Prentice Docker, MD, [place], [facility] 05/07/2019 8:41 AM

## 2019-06-08 ENCOUNTER — Other Ambulatory Visit: Payer: Self-pay

## 2019-06-08 ENCOUNTER — Ambulatory Visit (INDEPENDENT_AMBULATORY_CARE_PROVIDER_SITE_OTHER): Payer: 59 | Admitting: Obstetrics and Gynecology

## 2019-06-08 ENCOUNTER — Encounter: Payer: Self-pay | Admitting: Obstetrics and Gynecology

## 2019-06-08 ENCOUNTER — Ambulatory Visit (INDEPENDENT_AMBULATORY_CARE_PROVIDER_SITE_OTHER): Payer: 59

## 2019-06-08 VITALS — BP 104/60 | Wt 203.0 lb

## 2019-06-08 DIAGNOSIS — B3731 Acute candidiasis of vulva and vagina: Secondary | ICD-10-CM

## 2019-06-08 DIAGNOSIS — O0992 Supervision of high risk pregnancy, unspecified, second trimester: Secondary | ICD-10-CM

## 2019-06-08 DIAGNOSIS — O0990 Supervision of high risk pregnancy, unspecified, unspecified trimester: Secondary | ICD-10-CM

## 2019-06-08 DIAGNOSIS — O09512 Supervision of elderly primigravida, second trimester: Secondary | ICD-10-CM

## 2019-06-08 DIAGNOSIS — B373 Candidiasis of vulva and vagina: Secondary | ICD-10-CM | POA: Diagnosis not present

## 2019-06-08 DIAGNOSIS — O09519 Supervision of elderly primigravida, unspecified trimester: Secondary | ICD-10-CM

## 2019-06-08 DIAGNOSIS — D259 Leiomyoma of uterus, unspecified: Secondary | ICD-10-CM

## 2019-06-08 DIAGNOSIS — Z363 Encounter for antenatal screening for malformations: Secondary | ICD-10-CM | POA: Diagnosis not present

## 2019-06-08 DIAGNOSIS — O3412 Maternal care for benign tumor of corpus uteri, second trimester: Secondary | ICD-10-CM

## 2019-06-08 DIAGNOSIS — O365920 Maternal care for other known or suspected poor fetal growth, second trimester, not applicable or unspecified: Secondary | ICD-10-CM

## 2019-06-08 MED ORDER — FLUCONAZOLE 150 MG PO TABS: 150.0000 mg | ORAL_TABLET | Freq: Once | ORAL | 0 refills | Status: AC

## 2019-06-08 MED ORDER — TERCONAZOLE 0.4 % VA CREA: 1.0000 | TOPICAL_CREAM | Freq: Every day | VAGINAL | 0 refills | Status: DC

## 2019-06-08 NOTE — Progress Notes (Signed)
Routine Prenatal Care Visit  Subjective  Toni Byrd is a 38 y.o. G3P0020 at [redacted]w[redacted]d being seen today for ongoing prenatal care.  She is currently monitored for the following issues for this high-risk pregnancy and has History of recurrent miscarriages; Fibroid uterus; Advanced maternal age, primigravida, antepartum; Supervision of high risk pregnancy, antepartum; and Uterine fibroids affecting pregnancy on their problem list.  ----------------------------------------------------------------------------------- Patient reports increased vaginal discharge.  Contractions: Not present. Vag. Bleeding: None.  Movement: Present. Denies leaking of fluid.  ----------------------------------------------------------------------------------- The following portions of the patient's history were reviewed and updated as appropriate: allergies, current medications, past family history, past medical history, past social history, past surgical history and problem list. Problem list updated.   Objective  Blood pressure 104/60, weight 203 lb (92.1 kg), last menstrual period 01/23/2019, unknown if currently breastfeeding. Pregravid weight 190 lb (86.2 kg) Total Weight Gain 13 lb (5.897 kg) Urinalysis:      Fetal Status: Fetal Heart Rate (bpm): 140   Movement: Present     General:  Alert, oriented and cooperative. Patient is in no acute distress.  Skin: Skin is warm and dry. No rash noted.   Cardiovascular: Normal heart rate noted  Respiratory: Normal respiratory effort, no problems with respiration noted  Abdomen: Soft, gravid, appropriate for gestational age. Pain/Pressure: Present     Pelvic:  Cervical exam deferred       copious yellow green curd like discharge  Extremities: Normal range of motion.  Edema: None  Mental Status: Normal mood and affect. Normal behavior. Normal judgment and thought content.     Assessment   38 y.o. G3P0020 at [redacted]w[redacted]d by  10/30/2019, by Last Menstrual Period  presenting for routine prenatal visit  Plan   THIRD Problems (from 03/03/19 to present)    Problem Noted Resolved   Uterine fibroids affecting pregnancy 04/09/2019 by Gae Dry, MD No   Overview Signed 04/09/2019  2:17 PM by Gae Dry, MD       Risks of PTL, pain, bleeding discussed    Location of fibroid fundal, unlikely obstructive for labor    Prior myomectomy, but OK for vaginal delivery per pt        Supervision of high risk pregnancy, antepartum 03/03/2019 by Dalia Heading, CNM No   Overview Addendum 04/09/2019  2:19 PM by Gae Dry, MD    Clinic [facility] Prenatal Labs  Dating L=8 Blood type: O/Positive/-- (04/21 1439)   Genetic Screen NIPS: Antibody:Negative (04/21 1439)  Anatomic [place]  Rubella: 8.61 (04/21 1439) Varicella: Im  GTT Early:high;  3 hr GTT:         Third trimester:  RPR: Non Reactive (04/21 1439)   Rhogam O+ HBsAg: Negative (04/21 1439)   TDaP vaccine                       Flu Shot: HIV: Non Reactive (04/21 1439)   Baby Food                                GBS:   Contraception  Pap: 11/20/2016 NLIM/HPV Neg  CBB  No   CS/VBAC Vaginal labor ok (prior myomectomy)   Support Person                  Gestational age appropriate obstetric precautions including but not limited to vaginal bleeding, contractions, leaking of fluid and fetal movement were reviewed  in detail with the patient.    Wet Prep: Clue Cells: Negative Fungal elements: Positive Trichomonas: Negative  Will treat with diflucan single dose and Terazol, significant yeast vaginitis, copious discharge. Encouraged healthy diet, consider early repeat 1GTT at 24-26 weeks Referral to MFM to repeat US for femur and humerus length.  Discussed fibroid uterus and their impact on pregnancy.  Has hysteroscopic resection of fibroid before.   Return in about 4 weeks (around 07/06/2019) for [place] with MD.  Homero Fellers MD [place], [facility] 06/08/2019, 10:02  AM

## 2019-06-08 NOTE — Progress Notes (Signed)
ROB/US C/o poss yeast infections, and having a lot of pressure in belly button area

## 2019-06-10 ENCOUNTER — Telehealth: Payer: Self-pay

## 2019-06-10 NOTE — Telephone Encounter (Signed)
Spoke w/pt to notify Toni Byrd out of office today. She is inquiring about the MFM referral apt. Advised will alert new referral person & she should here something within 24-48 hours.

## 2019-06-10 NOTE — Telephone Encounter (Signed)
Pt would like to speak w/Dr. Gilman Schmidt about her 06/08/2019 apt. Cb#[phone]

## 2019-06-11 ENCOUNTER — Other Ambulatory Visit: Payer: Self-pay | Admitting: Obstetrics and Gynecology

## 2019-06-11 DIAGNOSIS — O09519 Supervision of elderly primigravida, unspecified trimester: Secondary | ICD-10-CM

## 2019-06-11 DIAGNOSIS — O3410 Maternal care for benign tumor of corpus uteri, unspecified trimester: Secondary | ICD-10-CM

## 2019-06-11 DIAGNOSIS — O0990 Supervision of high risk pregnancy, unspecified, unspecified trimester: Secondary | ICD-10-CM

## 2019-06-11 DIAGNOSIS — D259 Leiomyoma of uterus, unspecified: Secondary | ICD-10-CM

## 2019-06-11 DIAGNOSIS — O365920 Maternal care for other known or suspected poor fetal growth, second trimester, not applicable or unspecified: Secondary | ICD-10-CM

## 2019-06-11 NOTE — Telephone Encounter (Signed)
Spoke to [place] for clarification of refer to place. She states she wants it here at [facility] in [place], I have faxed referral.

## 2019-06-16 ENCOUNTER — Telehealth: Payer: Self-pay

## 2019-06-16 NOTE — Telephone Encounter (Signed)
Please advise 

## 2019-06-16 NOTE — Telephone Encounter (Signed)
Pt returned call, requesting a note stating that she is a High Risk Pregnancy & she is not able to over exert herself in activities such as not lifting more than 7 lbs. Cb#[phone]

## 2019-06-16 NOTE — Telephone Encounter (Signed)
Pt calling re about last appt.  [phone] Left detailed msg to call back c details about what is going on.

## 2019-06-16 NOTE — Telephone Encounter (Signed)
She can have a note with lifting precautions. I

## 2019-06-18 ENCOUNTER — Other Ambulatory Visit: Payer: Self-pay

## 2019-06-18 DIAGNOSIS — O09519 Supervision of elderly primigravida, unspecified trimester: Secondary | ICD-10-CM

## 2019-06-22 ENCOUNTER — Ambulatory Visit: Payer: 59

## 2019-06-22 ENCOUNTER — Other Ambulatory Visit: Payer: Self-pay

## 2019-06-22 ENCOUNTER — Ambulatory Visit
Admission: RE | Admit: 2019-06-22 | Discharge: 2019-06-22 | Disposition: A | Discharge status: Home / Self Care | Payer: 59 | Source: Ambulatory Visit | Attending: Maternal & Fetal Medicine | Admitting: Maternal & Fetal Medicine

## 2019-06-22 DIAGNOSIS — D259 Leiomyoma of uterus, unspecified: Secondary | ICD-10-CM | POA: Diagnosis not present

## 2019-06-22 DIAGNOSIS — O0990 Supervision of high risk pregnancy, unspecified, unspecified trimester: Secondary | ICD-10-CM

## 2019-06-22 DIAGNOSIS — O365920 Maternal care for other known or suspected poor fetal growth, second trimester, not applicable or unspecified: Secondary | ICD-10-CM

## 2019-06-22 DIAGNOSIS — O09522 Supervision of elderly multigravida, second trimester: Secondary | ICD-10-CM | POA: Insufficient documentation

## 2019-06-22 DIAGNOSIS — Z3A21 21 weeks gestation of pregnancy: Secondary | ICD-10-CM | POA: Insufficient documentation

## 2019-06-22 DIAGNOSIS — O321XX0 Maternal care for breech presentation, not applicable or unspecified: Secondary | ICD-10-CM | POA: Diagnosis not present

## 2019-06-22 DIAGNOSIS — O3412 Maternal care for benign tumor of corpus uteri, second trimester: Secondary | ICD-10-CM | POA: Diagnosis not present

## 2019-06-22 DIAGNOSIS — O09519 Supervision of elderly primigravida, unspecified trimester: Secondary | ICD-10-CM

## 2019-06-22 HISTORY — DX: Benign neoplasm of connective and other soft tissue, unspecified: D21.9

## 2019-06-23 NOTE — Progress Notes (Signed)
reviewed

## 2019-06-24 NOTE — Telephone Encounter (Signed)
Done

## 2019-07-06 ENCOUNTER — Encounter: Payer: Self-pay | Admitting: Obstetrics & Gynecology

## 2019-07-06 ENCOUNTER — Ambulatory Visit (INDEPENDENT_AMBULATORY_CARE_PROVIDER_SITE_OTHER): Payer: 59 | Admitting: Obstetrics & Gynecology

## 2019-07-06 ENCOUNTER — Other Ambulatory Visit: Payer: Self-pay

## 2019-07-06 VITALS — BP 120/80 | Wt 207.0 lb

## 2019-07-06 DIAGNOSIS — D259 Leiomyoma of uterus, unspecified: Secondary | ICD-10-CM

## 2019-07-06 DIAGNOSIS — O0992 Supervision of high risk pregnancy, unspecified, second trimester: Secondary | ICD-10-CM

## 2019-07-06 DIAGNOSIS — O09519 Supervision of elderly primigravida, unspecified trimester: Secondary | ICD-10-CM

## 2019-07-06 DIAGNOSIS — O0990 Supervision of high risk pregnancy, unspecified, unspecified trimester: Secondary | ICD-10-CM

## 2019-07-06 DIAGNOSIS — O09512 Supervision of elderly primigravida, second trimester: Secondary | ICD-10-CM

## 2019-07-06 DIAGNOSIS — Z23 Encounter for immunization: Secondary | ICD-10-CM

## 2019-07-06 DIAGNOSIS — R7309 Other abnormal glucose: Secondary | ICD-10-CM

## 2019-07-06 DIAGNOSIS — O3412 Maternal care for benign tumor of corpus uteri, second trimester: Secondary | ICD-10-CM

## 2019-07-06 DIAGNOSIS — Z3A23 23 weeks gestation of pregnancy: Secondary | ICD-10-CM

## 2019-07-06 NOTE — Progress Notes (Signed)
  Subjective  Fetal Movement? yes Contractions? no Leaking Fluid? no Vaginal Bleeding? no No nausea Objective  BP 120/80   Wt 207 lb (93.9 kg)   LMP 01/23/2019 (Exact Date)   BMI 32.42 kg/m  General: NAD Pumonary: no increased work of breathing Abdomen: gravid, non-tender Extremities: no edema Psychiatric: mood appropriate, affect full  Assessment  38 y.o. G3P0020 at [redacted]w[redacted]d by  10/30/2019, by Last Menstrual Period presenting for routine prenatal visit  Plan   Problem List Items Addressed This Visit      Genitourinary   Uterine fibroids affecting pregnancy (Chronic)     Other   Advanced maternal age, primigravida, antepartum   Supervision of high risk pregnancy, antepartum    Other Visit Diagnoses    [redacted] weeks gestation of pregnancy    -  Primary   Abnormal GTT (glucose tolerance test)       Relevant Orders   28 Week RH+Panel      THIRD Problems (from 03/03/19 to present)    Problem Noted Resolved   Uterine fibroids affecting pregnancy 04/09/2019 by Gae Dry, MD No   Overview Signed 04/09/2019  2:17 PM by Gae Dry, MD       Risks of PTL, pain, bleeding discussed    Location of fibroid fundal, unlikely obstructive for labor    Prior myomectomy, but OK for vaginal delivery per pt        Supervision of high risk pregnancy, antepartum 03/03/2019 by Dalia Heading, CNM No   Overview Addendum 07/06/2019  9:00 AM by Gae Dry, MD    Clinic [facility] Prenatal Labs  Dating L=8 Blood type: O/Positive/-- (04/21 1439)   Genetic Screen NIPS:nml XY Antibody:Negative (04/21 1439)  Anatomic [place] [facility] Rubella: 8.61 (04/21 1439) Varicella: Im  GTT Early:high;  3 hr GTT:         Third trimester:  RPR: Non Reactive (04/21 1439)   Rhogam O+ HBsAg: Negative (04/21 1439)   TDaP vaccine     Flu Shot: 07/06/19 HIV: Non Reactive (04/21 1439)   Baby Food Breast at first GBS:   Contraception None, planning for pregnancy soon after delivery Pap: 11/20/2016 NLIM/HPV Neg   CBB  No   CS/VBAC Vaginal labor ok (prior myomectomy)   Support Person Husband               Flu shot today  Breast feeding planned, although not for long as desires to try for pregnancy again soon after delivery  Glucola nv, discussed going straight to 3 hour as prior glucola in May abnormal, but she prefers the shorter test and hopes for normal this time.  She has been following carb restricted diet  Barnett Applebaum, MD, [place], [facility] 07/06/2019  9:02 AM

## 2019-08-03 ENCOUNTER — Ambulatory Visit (INDEPENDENT_AMBULATORY_CARE_PROVIDER_SITE_OTHER): Payer: 59 | Admitting: Advanced Practice Midwife

## 2019-08-03 ENCOUNTER — Other Ambulatory Visit: Payer: Self-pay

## 2019-08-03 ENCOUNTER — Encounter: Payer: Self-pay | Admitting: Advanced Practice Midwife

## 2019-08-03 ENCOUNTER — Other Ambulatory Visit: Payer: 59

## 2019-08-03 VITALS — BP 122/74 | Wt 212.0 lb

## 2019-08-03 DIAGNOSIS — B373 Candidiasis of vulva and vagina: Secondary | ICD-10-CM

## 2019-08-03 DIAGNOSIS — B3731 Acute candidiasis of vulva and vagina: Secondary | ICD-10-CM

## 2019-08-03 DIAGNOSIS — O26892 Other specified pregnancy related conditions, second trimester: Secondary | ICD-10-CM

## 2019-08-03 DIAGNOSIS — N898 Other specified noninflammatory disorders of vagina: Secondary | ICD-10-CM

## 2019-08-03 DIAGNOSIS — Z3A27 27 weeks gestation of pregnancy: Secondary | ICD-10-CM

## 2019-08-03 DIAGNOSIS — R7309 Other abnormal glucose: Secondary | ICD-10-CM

## 2019-08-03 DIAGNOSIS — O09522 Supervision of elderly multigravida, second trimester: Secondary | ICD-10-CM

## 2019-08-03 MED ORDER — TERCONAZOLE 0.8 % VA CREA: 1.0000 | TOPICAL_CREAM | Freq: Every day | VAGINAL | 1 refills | Status: DC

## 2019-08-03 NOTE — Patient Instructions (Signed)
Vaginitis Vaginitis is a condition in which the vaginal tissue swells and becomes red (inflamed). This condition is most often caused by a change in the normal balance of bacteria and yeast that live in the vagina. This change causes an overgrowth of certain bacteria or yeast, which causes the inflammation. There are different types of vaginitis, but the most common types are:  Bacterial vaginosis.  Yeast infection (candidiasis).  Trichomoniasis vaginitis. This is a sexually transmitted disease (STD).  Viral vaginitis.  Atrophic vaginitis.  Allergic vaginitis. What are the causes? The cause of this condition depends on the type of vaginitis. It can be caused by:  Bacteria (bacterial vaginosis).  Yeast, which is a fungus (yeast infection).  A parasite (trichomoniasis vaginitis).  A virus (viral vaginitis).  Low hormone levels (atrophic vaginitis). Low hormone levels can occur during pregnancy, breastfeeding, or after menopause.  Irritants, such as bubble baths, scented tampons, and feminine sprays (allergic vaginitis). Other factors can change the normal balance of the yeast and bacteria that live in the vagina. These include:  Antibiotic medicines.  Poor hygiene.  Diaphragms, vaginal sponges, spermicides, birth control pills, and intrauterine devices (IUD).  Sex.  Infection.  Uncontrolled diabetes.  A weakened defense (immune) system. What increases the risk? This condition is more likely to develop in women who:  Smoke.  Use vaginal douches, scented tampons, or scented sanitary pads.  Wear tight-fitting pants.  Wear thong underwear.  Use oral birth control pills or an IUD.  Have sex without a condom.  Have multiple sex partners.  Have an STD.  Frequently use the spermicide nonoxynol-9.  Eat lots of foods high in sugar.  Have uncontrolled diabetes.  Have low estrogen levels.  Have a weakened immune system from an immune disorder or medical  treatment.  Are pregnant or breastfeeding. What are the signs or symptoms? Symptoms vary depending on the cause of the vaginitis. Common symptoms include:  Abnormal vaginal discharge. ? The discharge is white, gray, or yellow with bacterial vaginosis. ? The discharge is thick, white, and cheesy with a yeast infection. ? The discharge is frothy and yellow or greenish with trichomoniasis.  A bad vaginal smell. The smell is fishy with bacterial vaginosis.  Vaginal itching, pain, or swelling.  Sex that is painful.  Pain or burning when urinating. Sometimes there are no symptoms. How is this diagnosed? This condition is diagnosed based on your symptoms and medical history. A physical exam, including a pelvic exam, will also be done. You may also have other tests, including:  Tests to determine the pH level (acidity or alkalinity) of your vagina.  A whiff test, to assess the odor that results when a sample of your vaginal discharge is mixed with a potassium hydroxide solution.  Tests of vaginal fluid. A sample will be examined under a microscope. How is this treated? Treatment varies depending on the type of vaginitis you have. Your treatment may include:  Antibiotic creams or pills to treat bacterial vaginosis and trichomoniasis.  Antifungal medicines, such as vaginal creams or suppositories, to treat a yeast infection.  Medicine to ease discomfort if you have viral vaginitis. Your sexual partner should also be treated.  Estrogen delivered in a cream, pill, suppository, or vaginal ring to treat atrophic vaginitis. If vaginal dryness occurs, lubricants and moisturizing creams may help. You may need to avoid scented soaps, sprays, or douches.  Stopping use of a product that is causing allergic vaginitis. Then using a vaginal cream to treat the symptoms. Follow   these instructions at home: Lifestyle  Keep your genital area clean and dry. Avoid soap, and only rinse the area with  water.  Do not douche or use tampons until your health care provider says it is okay to do so. Use sanitary pads, if needed.  Do not have sex until your health care provider approves. When you can return to sex, practice safe sex and use condoms.  Wipe from front to back. This avoids the spread of bacteria from the rectum to the vagina. General instructions  Take over-the-counter and prescription medicines only as told by your health care provider.  If you were prescribed an antibiotic medicine, take or use it as told by your health care provider. Do not stop taking or using the antibiotic even if you start to feel better.  Keep all follow-up visits as told by your health care provider. This is important. How is this prevented?  Use mild, non-scented products. Do not use things that can irritate the vagina, such as fabric softeners. Avoid the following products if they are scented: ? Feminine sprays. ? Detergents. ? Tampons. ? Feminine hygiene products. ? Soaps or bubble baths.  Let air reach your genital area. ? Wear cotton underwear to reduce moisture buildup. ? Avoid wearing underwear while you sleep. ? Avoid wearing tight pants and underwear or nylons without a cotton panel. ? Avoid wearing thong underwear.  Take off any wet clothing, such as bathing suits, as soon as possible.  Practice safe sex and use condoms. Contact a health care provider if:  You have abdominal pain.  You have a fever.  You have symptoms that last for more than 2-3 days. Get help right away if:  You have a fever and your symptoms suddenly get worse. Summary  Vaginitis is a condition in which the vaginal tissue becomes inflamed.This condition is most often caused by a change in the normal balance of bacteria and yeast that live in the vagina.  Treatment varies depending on the type of vaginitis you have.  Do not douche, use tampons , or have sex until your health care provider approves. When  you can return to sex, practice safe sex and use condoms. This information is not intended to replace advice given to you by your health care provider. Make sure you discuss any questions you have with your health care provider. Document Released: 08/26/2007 Document Revised: 10/11/2017 Document Reviewed: 12/04/2016 Elsevier Patient Education  [place] of Pregnancy The third trimester is from week 28 through week 40 (months 7 through 9). The third trimester is a time when the unborn baby (fetus) is growing rapidly. At the end of the ninth month, the fetus is about 20 inches in length and weighs 6-10 pounds. Body changes during your third trimester Your body will continue to go through many changes during pregnancy. The changes vary from woman to woman. During the third trimester:  Your weight will continue to increase. You can expect to gain 25-35 pounds (11-16 kg) by the end of the pregnancy.  You may begin to get stretch marks on your hips, abdomen, and breasts.  You may urinate more often because the fetus is moving lower into your pelvis and pressing on your bladder.  You may develop or continue to have heartburn. This is caused by increased hormones that slow down muscles in the digestive tract.  You may develop or continue to have constipation because increased hormones slow digestion and cause the muscles that push  waste through your intestines to relax.  You may develop hemorrhoids. These are swollen veins (varicose veins) in the rectum that can itch or be painful.  You may develop swollen, bulging veins (varicose veins) in your legs.  You may have increased body aches in the pelvis, back, or thighs. This is due to weight gain and increased hormones that are relaxing your joints.  You may have changes in your hair. These can include thickening of your hair, rapid growth, and changes in texture. Some women also have hair loss during or after pregnancy, or  hair that feels dry or thin. Your hair will most likely return to normal after your baby is born.  Your breasts will continue to grow and they will continue to become tender. A yellow fluid (colostrum) may leak from your breasts. This is the first milk you are producing for your baby.  Your belly button may stick out.  You may notice more swelling in your hands, face, or ankles.  You may have increased tingling or numbness in your hands, arms, and legs. The skin on your belly may also feel numb.  You may feel short of breath because of your expanding uterus.  You may have more problems sleeping. This can be caused by the size of your belly, increased need to urinate, and an increase in your body's metabolism.  You may notice the fetus "dropping," or moving lower in your abdomen (lightening).  You may have increased vaginal discharge.  You may notice your joints feel loose and you may have pain around your pelvic bone. What to expect at prenatal visits You will have prenatal exams every 2 weeks until week 36. Then you will have weekly prenatal exams. During a routine prenatal visit:  You will be weighed to make sure you and the baby are growing normally.  Your blood pressure will be taken.  Your abdomen will be measured to track your baby's growth.  The fetal heartbeat will be listened to.  Any test results from the previous visit will be discussed.  You may have a cervical check near your due date to see if your cervix has softened or thinned (effaced).  You will be tested for Group B streptococcus. This happens between 35 and 37 weeks. Your health care provider may ask you:  What your birth plan is.  How you are feeling.  If you are feeling the baby move.  If you have had any abnormal symptoms, such as leaking fluid, bleeding, severe headaches, or abdominal cramping.  If you are using any tobacco products, including cigarettes, chewing tobacco, and electronic cigarettes.   If you have any questions. Other tests or screenings that may be performed during your third trimester include:  Blood tests that check for low iron levels (anemia).  Fetal testing to check the health, activity level, and growth of the fetus. Testing is done if you have certain medical conditions or if there are problems during the pregnancy.  Nonstress test (NST). This test checks the health of your baby to make sure there are no signs of problems, such as the baby not getting enough oxygen. During this test, a belt is placed around your belly. The baby is made to move, and its heart rate is monitored during movement. What is false labor? False labor is a condition in which you feel small, irregular tightenings of the muscles in the womb (contractions) that usually go away with rest, changing position, or drinking water. These are called [facility]  Hicks contractions. Contractions may last for hours, days, or even weeks before true labor sets in. If contractions come at regular intervals, become more frequent, increase in intensity, or become painful, you should see your health care provider. What are the signs of labor?  Abdominal cramps.  Regular contractions that start at 10 minutes apart and become stronger and more frequent with time.  Contractions that start on the top of the uterus and spread down to the lower abdomen and back.  Increased pelvic pressure and dull back pain.  A watery or bloody mucus discharge that comes from the vagina.  Leaking of amniotic fluid. This is also known as your "water breaking." It could be a slow trickle or a gush. Let your health care provider know if it has a color or strange odor. If you have any of these signs, call your health care provider right away, even if it is before your due date. Follow these instructions at home: Medicines  Follow your health care provider's instructions regarding medicine use. Specific medicines may be either safe or  unsafe to take during pregnancy.  Take a prenatal vitamin that contains at least 600 micrograms (mcg) of folic acid.  If you develop constipation, try taking a stool softener if your health care provider approves. Eating and drinking   Eat a balanced diet that includes fresh fruits and vegetables, whole grains, good sources of protein such as meat, eggs, or tofu, and low-fat dairy. Your health care provider will help you determine the amount of weight gain that is right for you.  Avoid raw meat and uncooked cheese. These carry germs that can cause birth defects in the baby.  If you have low calcium intake from food, talk to your health care provider about whether you should take a daily calcium supplement.  Eat four or five small meals rather than three large meals a day.  Limit foods that are high in fat and processed sugars, such as fried and sweet foods.  To prevent constipation: ? Drink enough fluid to keep your urine clear or pale yellow. ? Eat foods that are high in fiber, such as fresh fruits and vegetables, whole grains, and beans. Activity  Exercise only as directed by your health care provider. Most women can continue their usual exercise routine during pregnancy. Try to exercise for 30 minutes at least 5 days a week. Stop exercising if you experience uterine contractions.  Avoid heavy lifting.  Do not exercise in extreme heat or humidity, or at high altitudes.  Wear low-heel, comfortable shoes.  Practice good posture.  You may continue to have sex unless your health care provider tells you otherwise. Relieving pain and discomfort  Take frequent breaks and rest with your legs elevated if you have leg cramps or low back pain.  Take warm sitz baths to soothe any pain or discomfort caused by hemorrhoids. Use hemorrhoid cream if your health care provider approves.  Wear a good support bra to prevent discomfort from breast tenderness.  If you develop varicose veins: ?  Wear support pantyhose or compression stockings as told by your healthcare provider. ? Elevate your feet for 15 minutes, 3-4 times a day. Prenatal care  Write down your questions. Take them to your prenatal visits.  Keep all your prenatal visits as told by your health care provider. This is important. Safety  Wear your seat belt at all times when driving.  Make a list of emergency phone numbers, including numbers for family, friends, the  hospital, and police and fire departments. General instructions  Avoid cat litter boxes and soil used by cats. These carry germs that can cause birth defects in the baby. If you have a cat, ask someone to clean the litter box for you.  Do not travel far distances unless it is absolutely necessary and only with the approval of your health care provider.  Do not use hot tubs, steam rooms, or saunas.  Do not drink alcohol.  Do not use any products that contain nicotine or tobacco, such as cigarettes and e-cigarettes. If you need help quitting, ask your health care provider.  Do not use any medicinal herbs or unprescribed drugs. These chemicals affect the formation and growth of the baby.  Do not douche or use tampons or scented sanitary pads.  Do not cross your legs for long periods of time.  To prepare for the arrival of your baby: ? Take prenatal classes to understand, practice, and ask questions about labor and delivery. ? Make a trial run to the hospital. ? Visit the hospital and tour the maternity area. ? Arrange for maternity or paternity leave through employers. ? Arrange for family and friends to take care of pets while you are in the hospital. ? Purchase a rear-facing car seat and make sure you know how to install it in your car. ? Pack your hospital bag. ? Prepare the baby's nursery. Make sure to remove all pillows and stuffed animals from the baby's crib to prevent suffocation.  Visit your dentist if you have not gone during your  pregnancy. Use a soft toothbrush to brush your teeth and be gentle when you floss. Contact a health care provider if:  You are unsure if you are in labor or if your water has broken.  You become dizzy.  You have mild pelvic cramps, pelvic pressure, or nagging pain in your abdominal area.  You have lower back pain.  You have persistent nausea, vomiting, or diarrhea.  You have an unusual or bad smelling vaginal discharge.  You have pain when you urinate. Get help right away if:  Your water breaks before 37 weeks.  You have regular contractions less than 5 minutes apart before 37 weeks.  You have a fever.  You are leaking fluid from your vagina.  You have spotting or bleeding from your vagina.  You have severe abdominal pain or cramping.  You have rapid weight loss or weight gain.  You have shortness of breath with chest pain.  You notice sudden or extreme swelling of your face, hands, ankles, feet, or legs.  Your baby makes fewer than 10 movements in 2 hours.  You have severe headaches that do not go away when you take medicine.  You have vision changes. Summary  The third trimester is from week 28 through week 40, months 7 through 9. The third trimester is a time when the unborn baby (fetus) is growing rapidly.  During the third trimester, your discomfort may increase as you and your baby continue to gain weight. You may have abdominal, leg, and back pain, sleeping problems, and an increased need to urinate.  During the third trimester your breasts will keep growing and they will continue to become tender. A yellow fluid (colostrum) may leak from your breasts. This is the first milk you are producing for your baby.  False labor is a condition in which you feel small, irregular tightenings of the muscles in the womb (contractions) that eventually go away. These are  called Braxton Hicks contractions. Contractions may last for hours, days, or even weeks before true labor  sets in.  Signs of labor can include: abdominal cramps; regular contractions that start at 10 minutes apart and become stronger and more frequent with time; watery or bloody mucus discharge that comes from the vagina; increased pelvic pressure and dull back pain; and leaking of amniotic fluid. This information is not intended to replace advice given to you by your health care provider. Make sure you discuss any questions you have with your health care provider. Document Released: 10/23/2001 Document Revised: 02/19/2019 Document Reviewed: 12/04/2016 Elsevier Patient Education  2020 [employer].

## 2019-08-03 NOTE — Progress Notes (Signed)
28 week labs today. No vb. No lof. Pt thinks she might have a yeast infection.

## 2019-08-03 NOTE — Progress Notes (Signed)
Routine Prenatal Care Visit  Subjective  Toni Byrd is a 38 y.o. G3P0020 at [redacted]w[redacted]d being seen today for ongoing prenatal care.  She is currently monitored for the following issues for this high-risk pregnancy and has History of recurrent miscarriages; Fibroid uterus; Advanced maternal age, primigravida, antepartum; Supervision of high risk pregnancy, antepartum; and Uterine fibroids affecting pregnancy on their problem list.  ----------------------------------------------------------------------------------- Patient reports vaginal itching and discharge for the past few days. She denies thick discharge or irritation or burning with urination.   Contractions: Not present. Vag. Bleeding: None.  Movement: Present. Leaking Fluid denies.  ----------------------------------------------------------------------------------- The following portions of the patient's history were reviewed and updated as appropriate: allergies, current medications, past family history, past medical history, past social history, past surgical history and problem list. Problem list updated.  Objective  Blood pressure 122/74, weight 212 lb (96.2 kg), last menstrual period 01/23/2019, unknown if currently breastfeeding. Pregravid weight 190 lb (86.2 kg) Total Weight Gain 22 lb (9.979 kg) Urinalysis: Urine Protein    Urine Glucose    Fetal Status: Fetal Heart Rate (bpm): 142 Fundal Height: 27 cm Movement: Present     General:  Alert, oriented and cooperative. Patient is in no acute distress.  Skin: Skin is warm and dry. No rash noted.   Cardiovascular: Normal heart rate noted  Respiratory: Normal respiratory effort, no problems with respiration noted  Abdomen: Soft, gravid, appropriate for gestational age. Pain/Pressure: Absent     Pelvic:  Cervical exam deferred      Wet prep: negative whiff, negative clue cells, positive yeast  Extremities: Normal range of motion.  Edema: None  Mental Status: Normal mood and  affect. Normal behavior. Normal judgment and thought content.   Assessment   38 y.o. G3P0020 at [redacted]w[redacted]d by  10/30/2019, by Last Menstrual Period presenting for routine prenatal visit  Plan   THIRD Problems (from 03/03/19 to present)    Problem Noted Resolved   Uterine fibroids affecting pregnancy 04/09/2019 by Gae Dry, MD No   Overview Signed 04/09/2019  2:17 PM by Gae Dry, MD       Risks of PTL, pain, bleeding discussed    Location of fibroid fundal, unlikely obstructive for labor    Prior myomectomy, but OK for vaginal delivery per pt        Supervision of high risk pregnancy, antepartum 03/03/2019 by Dalia Heading, CNM No   Overview Addendum 07/06/2019  9:00 AM by Gae Dry, MD    Clinic [facility] Prenatal Labs  Dating L=8 Blood type: O/Positive/-- (04/21 1439)   Genetic Screen NIPS:nml XY Antibody:Negative (04/21 1439)  Anatomic [place] [facility] Rubella: 8.61 (04/21 1439) Varicella: Im  GTT Early:high;  3 hr GTT:         Third trimester:  RPR: Non Reactive (04/21 1439)   Rhogam O+ HBsAg: Negative (04/21 1439)   TDaP vaccine     Flu Shot: 07/06/19 HIV: Non Reactive (04/21 1439)   Baby Food Breast at first GBS:   Contraception None, planning for pregnancy soon after delivery Pap: 11/20/2016 NLIM/HPV Neg  CBB  No   CS/VBAC Vaginal labor ok (prior myomectomy)   Support Person Husband                 Preterm labor symptoms and general obstetric precautions including but not limited to vaginal bleeding, contractions, leaking of fluid and fetal movement were reviewed in detail with the patient. Please refer to After Visit Summary for other counseling recommendations.  Rx Terconazole (3) 0.8%  Return in about 2 weeks (around 08/17/2019) for rob.  Rod Can, CNM 08/03/2019 8:59 AM

## 2019-08-04 ENCOUNTER — Other Ambulatory Visit: Payer: Self-pay | Admitting: Obstetrics & Gynecology

## 2019-08-04 ENCOUNTER — Telehealth: Payer: Self-pay | Admitting: Obstetrics & Gynecology

## 2019-08-04 DIAGNOSIS — O99810 Abnormal glucose complicating pregnancy: Secondary | ICD-10-CM

## 2019-08-04 LAB — 28 WEEK RH+PANEL
Basophils Absolute: 0 10*3/uL (ref 0.0–0.2)
Basos: 0 %
EOS (ABSOLUTE): 0.1 10*3/uL (ref 0.0–0.4)
Eos: 1 %
Gestational Diabetes Screen: 176 mg/dL — ABNORMAL HIGH (ref 65–139)
HIV Screen 4th Generation wRfx: NONREACTIVE
Hematocrit: 35.9 % (ref 34.0–46.6)
Hemoglobin: 12 g/dL (ref 11.1–15.9)
Immature Grans (Abs): 0.1 10*3/uL (ref 0.0–0.1)
Immature Granulocytes: 1 %
Lymphocytes Absolute: 2.2 10*3/uL (ref 0.7–3.1)
Lymphs: 24 %
MCH: 27.8 pg (ref 26.6–33.0)
MCHC: 33.4 g/dL (ref 31.5–35.7)
MCV: 83 fL (ref 79–97)
Monocytes Absolute: 0.4 10*3/uL (ref 0.1–0.9)
Monocytes: 5 %
Neutrophils Absolute: 6.3 10*3/uL (ref 1.4–7.0)
Neutrophils: 69 %
Platelets: 203 10*3/uL (ref 150–450)
RBC: 4.32 x10E6/uL (ref 3.77–5.28)
RDW: 13.1 % (ref 11.7–15.4)
RPR Ser Ql: NONREACTIVE
WBC: 9 10*3/uL (ref 3.4–10.8)

## 2019-08-04 NOTE — Progress Notes (Signed)
Schedule 3 hour GTT for abnormal glucola

## 2019-08-04 NOTE — Telephone Encounter (Signed)
Order placed but 10/1 is too far, change to THIS WEEK

## 2019-08-04 NOTE — Telephone Encounter (Signed)
Patient is schedule Thursday, 08/13/19 at 8:20 for lab. Please place lab order. Thank you!

## 2019-08-04 NOTE — Telephone Encounter (Signed)
Patient is reschedule to 08/06/19

## 2019-08-04 NOTE — Telephone Encounter (Signed)
-----   Message from Gae Dry, MD sent at 08/04/2019  9:51 AM EDT ----- Schedule 3 hour GTT for abnormal glucola

## 2019-08-06 ENCOUNTER — Other Ambulatory Visit: Payer: Self-pay

## 2019-08-06 ENCOUNTER — Other Ambulatory Visit: Payer: 59

## 2019-08-06 DIAGNOSIS — O99810 Abnormal glucose complicating pregnancy: Secondary | ICD-10-CM

## 2019-08-07 LAB — GESTATIONAL GLUCOSE TOLERANCE
Glucose, Fasting: 75 mg/dL (ref 65–94)
Glucose, GTT - 1 Hour: 165 mg/dL (ref 65–179)
Glucose, GTT - 2 Hour: 151 mg/dL (ref 65–154)
Glucose, GTT - 3 Hour: 139 mg/dL (ref 65–139)

## 2019-08-10 ENCOUNTER — Ambulatory Visit (INDEPENDENT_AMBULATORY_CARE_PROVIDER_SITE_OTHER): Payer: 59 | Admitting: Obstetrics and Gynecology

## 2019-08-10 ENCOUNTER — Encounter: Payer: Self-pay | Admitting: Obstetrics and Gynecology

## 2019-08-10 ENCOUNTER — Other Ambulatory Visit: Payer: Self-pay

## 2019-08-10 VITALS — BP 128/68 | Wt 210.0 lb

## 2019-08-10 DIAGNOSIS — Z3A28 28 weeks gestation of pregnancy: Secondary | ICD-10-CM

## 2019-08-10 DIAGNOSIS — O4703 False labor before 37 completed weeks of gestation, third trimester: Secondary | ICD-10-CM
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
## 2019-08-11 ENCOUNTER — Encounter (HOSPITAL_COMMUNITY): Payer: Self-pay | Admitting: *Deleted

## 2019-08-11 ENCOUNTER — Inpatient Hospital Stay (HOSPITAL_COMMUNITY)
Admission: AD | Admit: 2019-08-11 | Discharge: 2019-08-11 | Disposition: A | Discharge status: Home / Self Care | Payer: 59 | Attending: Obstetrics & Gynecology | Admitting: Obstetrics & Gynecology

## 2019-08-11 ENCOUNTER — Other Ambulatory Visit: Payer: Self-pay

## 2019-08-11 DIAGNOSIS — R109 Unspecified abdominal pain: Secondary | ICD-10-CM | POA: Diagnosis not present

## 2019-08-11 DIAGNOSIS — R030 Elevated blood-pressure reading, without diagnosis of hypertension: Secondary | ICD-10-CM | POA: Diagnosis not present

## 2019-08-11 DIAGNOSIS — Z3689 Encounter for other specified antenatal screening: Secondary | ICD-10-CM | POA: Insufficient documentation

## 2019-08-11 DIAGNOSIS — Z3A28 28 weeks gestation of pregnancy: Secondary | ICD-10-CM | POA: Insufficient documentation

## 2019-08-11 DIAGNOSIS — O09523 Supervision of elderly multigravida, third trimester: Secondary | ICD-10-CM | POA: Diagnosis not present

## 2019-08-11 DIAGNOSIS — O26893 Other specified pregnancy related conditions, third trimester: Secondary | ICD-10-CM | POA: Diagnosis not present

## 2019-08-11 LAB — URINALYSIS, ROUTINE W REFLEX MICROSCOPIC
Bacteria, UA: NONE SEEN
Bilirubin Urine: NEGATIVE
Glucose, UA: NEGATIVE mg/dL
Hgb urine dipstick: NEGATIVE
Ketones, ur: 20 mg/dL — AB
Leukocytes,Ua: NEGATIVE
Nitrite: NEGATIVE
Protein, ur: 30 mg/dL — AB
Specific Gravity, Urine: 1.023 (ref 1.005–1.030)
pH: 6 (ref 5.0–8.0)

## 2019-08-11 LAB — FETAL FIBRONECTIN: Fetal Fibronectin: NEGATIVE

## 2019-08-11 MED ORDER — ACETAMINOPHEN 500 MG PO TABS
1000.0000 mg | ORAL_TABLET | Freq: Once | ORAL | Status: AC
  Administered 2019-08-11: 1000 mg via ORAL
  Filled 2019-08-11: qty 2
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
## 2019-08-11 NOTE — Discharge Instructions (Signed)
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
## 2019-08-11 NOTE — Progress Notes (Signed)
Negative, released to mychart with note.

## 2019-08-13 ENCOUNTER — Other Ambulatory Visit: Payer: Self-pay

## 2019-08-13 ENCOUNTER — Other Ambulatory Visit: Payer: 59

## 2019-08-13 DIAGNOSIS — O09519 Supervision of elderly primigravida, unspecified trimester: Secondary | ICD-10-CM

## 2019-08-17 ENCOUNTER — Encounter: Payer: Self-pay | Admitting: Maternal Newborn

## 2019-08-17 ENCOUNTER — Ambulatory Visit (INDEPENDENT_AMBULATORY_CARE_PROVIDER_SITE_OTHER): Payer: 59 | Admitting: Maternal Newborn

## 2019-08-17 ENCOUNTER — Other Ambulatory Visit: Payer: Self-pay | Admitting: Maternal & Fetal Medicine

## 2019-08-17 ENCOUNTER — Ambulatory Visit
Admission: RE | Admit: 2019-08-17 | Discharge: 2019-08-17 | Disposition: A | Discharge status: Home / Self Care | Payer: 59 | Source: Ambulatory Visit | Attending: Obstetrics and Gynecology | Admitting: Obstetrics and Gynecology

## 2019-08-17 ENCOUNTER — Other Ambulatory Visit: Payer: Self-pay

## 2019-08-17 VITALS — BP 130/70 | Wt 212.2 lb

## 2019-08-17 DIAGNOSIS — O365930 Maternal care for other known or suspected poor fetal growth, third trimester, not applicable or unspecified: Secondary | ICD-10-CM

## 2019-08-17 DIAGNOSIS — O09519 Supervision of elderly primigravida, unspecified trimester: Secondary | ICD-10-CM

## 2019-08-17 DIAGNOSIS — O0993 Supervision of high risk pregnancy, unspecified, third trimester: Secondary | ICD-10-CM

## 2019-08-17 DIAGNOSIS — O09513 Supervision of elderly primigravida, third trimester: Secondary | ICD-10-CM | POA: Diagnosis not present

## 2019-08-17 DIAGNOSIS — D251 Intramural leiomyoma of uterus: Secondary | ICD-10-CM | POA: Diagnosis not present

## 2019-08-17 DIAGNOSIS — O3413 Maternal care for benign tumor of corpus uteri, third trimester: Secondary | ICD-10-CM | POA: Diagnosis not present

## 2019-08-17 DIAGNOSIS — Z3A29 29 weeks gestation of pregnancy: Secondary | ICD-10-CM | POA: Insufficient documentation

## 2019-08-17 DIAGNOSIS — O0990 Supervision of high risk pregnancy, unspecified, unspecified trimester: Secondary | ICD-10-CM

## 2019-08-17 LAB — POCT URINALYSIS DIPSTICK OB: Glucose, UA: NEGATIVE

## 2019-08-17 NOTE — Progress Notes (Signed)
Routine Prenatal Care Visit  Subjective  Toni Byrd is a 38 y.o. G3P0020 at [redacted]w[redacted]d being seen today for ongoing prenatal care.  She is currently monitored for the following issues for this high-risk pregnancy and has History of recurrent miscarriages; Fibroid uterus; Advanced maternal age, primigravida, antepartum; Supervision of high risk pregnancy, antepartum; Uterine fibroids affecting pregnancy; and Poor fetal growth affecting management of mother in third trimester on their problem list.  ----------------------------------------------------------------------------------- Patient reports that she was seen at [facility] today and will need twice weekly antenatal testing going forward. 45 growth is <3rd percentile per the ultrasound report. Contractions: Not present. Vag. Bleeding: None.  Movement: Present. No leaking of fluid.  ----------------------------------------------------------------------------------- The following portions of the patient's history were reviewed and updated as appropriate: allergies, current medications, past family history, past medical history, past social history, past surgical history and problem list. Problem list updated.   Objective  Blood pressure 130/70, weight 212 lb 3.2 oz (96.3 kg), last menstrual period 01/23/2019. Pregravid weight 190 lb (86.2 kg) Total Weight Gain 22 lb 3.2 oz (10.1 kg) Urinalysis:Urine dipstick shows negative for glucose, positive for protein (small).  Fetal Status: Fetal Heart Rate (bpm): 143 ([place])   Movement: Present     General:  Alert, oriented and cooperative. Patient is in no acute distress.  Skin: Skin is warm and Byrd. No rash noted.   Cardiovascular: Normal heart rate noted  Respiratory: Normal respiratory effort, no problems with respiration noted  Abdomen: Soft, gravid, appropriate for gestational age. Pain/Pressure: Absent     Pelvic:  Cervical exam deferred        Extremities: Normal range of motion.   Edema: None  Mental Status: Normal mood and affect. Normal behavior. Normal judgment and thought content.     Assessment   38 y.o. G3P0020 at [redacted]w[redacted]d, EDD 10/30/2019 by Last Menstrual Period presenting for a routine prenatal visit.  Plan   THIRD Problems (from 03/03/19 to present)    Problem Noted Resolved   Poor fetal growth affecting management of mother in third trimester 08/17/2019 by Wynona Neat, MD No   Overview Addendum 08/17/2019 10:31 AM by Wynona Neat, MD    EFW  On 10/5 at [redacted]w[redacted]d <3rd% with AC <3rd%; elevated cord Dopplers, BPP 8/8 Recommend 2x weekly antenatal testing with weekly AFI/cord Dopplers and growth in 3 weeks. If umbilical artery Dopplers demonstrate absent or REDF, recommend inpatient monitoring and administration of steroids.      Uterine fibroids affecting pregnancy 04/09/2019 by Toni Dry, MD No   Overview Signed 04/09/2019  2:17 PM by Toni Dry, MD       Risks of PTL, pain, bleeding discussed    Location of fibroid fundal, unlikely obstructive for labor    Prior myomectomy, but OK for vaginal delivery per pt        Supervision of high risk pregnancy, antepartum 03/03/2019 by Toni Byrd, CNM No   Overview Addendum 07/06/2019  9:00 AM by Toni Dry, MD    Clinic [facility] Prenatal Labs  Dating L=8 Blood type: O/Positive/-- (04/21 1439)   Genetic Screen NIPS:nml XY Antibody:Negative (04/21 1439)  Anatomic [place] [facility] Rubella: 8.61 (04/21 1439) Varicella: Im  GTT Early:high;  3 hr GTT:         Third trimester:  RPR: Non Reactive (04/21 1439)   Rhogam O+ HBsAg: Negative (04/21 1439)   TDaP vaccine     Flu Shot: 07/06/19 HIV: Non Reactive (04/21 1439)   Baby  Food Breast at first GBS:   Contraception None, planning for pregnancy soon after delivery Pap: 11/20/2016 NLIM/HPV Neg  CBB  No   CS/VBAC Vaginal labor ok (prior myomectomy)   Support Person Husband              Discussed that she can have one of the twice weekly antenatal  testing appointments here and the weekly AFI/Dopplers at [facility]. For now, she prefers to have both appointments at [facility] and her next several appointments have been scheduled.   Regular follow up visit with [place] next time, and then can begin to schedule NST and weekly visits here or continue to be seen at [facility] as she wishes.  Please refer to After Visit Summary for other counseling recommendations.   Return in about 2 weeks (around 08/31/2019) for Marie.  [name], CNM 08/17/2019  11:02 AM

## 2019-08-17 NOTE — Patient Instructions (Signed)
Third Trimester of Pregnancy The third trimester is from week 28 through week 40 (months 7 through 9). The third trimester is a time when the unborn baby (fetus) is growing rapidly. At the end of the ninth month, the fetus is about 20 inches in length and weighs 6-10 pounds. Body changes during your third trimester Your body will continue to go through many changes during pregnancy. The changes vary from woman to woman. During the third trimester:  Your weight will continue to increase. You can expect to gain 25-35 pounds (11-16 kg) by the end of the pregnancy.  You may begin to get stretch marks on your hips, abdomen, and breasts.  You may urinate more often because the fetus is moving lower into your pelvis and pressing on your bladder.  You may develop or continue to have heartburn. This is caused by increased hormones that slow down muscles in the digestive tract.  You may develop or continue to have constipation because increased hormones slow digestion and cause the muscles that push waste through your intestines to relax.  You may develop hemorrhoids. These are swollen veins (varicose veins) in the rectum that can itch or be painful.  You may develop swollen, bulging veins (varicose veins) in your legs.  You may have increased body aches in the pelvis, back, or thighs. This is due to weight gain and increased hormones that are relaxing your joints.  You may have changes in your hair. These can include thickening of your hair, rapid growth, and changes in texture. Some women also have hair loss during or after pregnancy, or hair that feels dry or thin. Your hair will most likely return to normal after your baby is born.  Your breasts will continue to grow and they will continue to become tender. A yellow fluid (colostrum) may leak from your breasts. This is the first milk you are producing for your baby.  Your belly button may stick out.  You may notice more swelling in your hands,  face, or ankles.  You may have increased tingling or numbness in your hands, arms, and legs. The skin on your belly may also feel numb.  You may feel short of breath because of your expanding uterus.  You may have more problems sleeping. This can be caused by the size of your belly, increased need to urinate, and an increase in your body's metabolism.  You may notice the fetus "dropping," or moving lower in your abdomen (lightening).  You may have increased vaginal discharge.  You may notice your joints feel loose and you may have pain around your pelvic bone. What to expect at prenatal visits You will have prenatal exams every 2 weeks until week 36. Then you will have weekly prenatal exams. During a routine prenatal visit:  You will be weighed to make sure you and the baby are growing normally.  Your blood pressure will be taken.  Your abdomen will be measured to track your baby's growth.  The fetal heartbeat will be listened to.  Any test results from the previous visit will be discussed.  You may have a cervical check near your due date to see if your cervix has softened or thinned (effaced).  You will be tested for Group B streptococcus. This happens between 35 and 37 weeks. Your health care provider may ask you:  What your birth plan is.  How you are feeling.  If you are feeling the baby move.  If you have had any abnormal   symptoms, such as leaking fluid, bleeding, severe headaches, or abdominal cramping.  If you are using any tobacco products, including cigarettes, chewing tobacco, and electronic cigarettes.  If you have any questions. Other tests or screenings that may be performed during your third trimester include:  Blood tests that check for low iron levels (anemia).  Fetal testing to check the health, activity level, and growth of the fetus. Testing is done if you have certain medical conditions or if there are problems during the pregnancy.  Nonstress test  (NST). This test checks the health of your baby to make sure there are no signs of problems, such as the baby not getting enough oxygen. During this test, a belt is placed around your belly. The baby is made to move, and its heart rate is monitored during movement. What is false labor? False labor is a condition in which you feel small, irregular tightenings of the muscles in the womb (contractions) that usually go away with rest, changing position, or drinking water. These are called Braxton Hicks contractions. Contractions may last for hours, days, or even weeks before true labor sets in. If contractions come at regular intervals, become more frequent, increase in intensity, or become painful, you should see your health care provider. What are the signs of labor?  Abdominal cramps.  Regular contractions that start at 10 minutes apart and become stronger and more frequent with time.  Contractions that start on the top of the uterus and spread down to the lower abdomen and back.  Increased pelvic pressure and dull back pain.  A watery or bloody mucus discharge that comes from the vagina.  Leaking of amniotic fluid. This is also known as your "water breaking." It could be a slow trickle or a gush. Let your health care provider know if it has a color or strange odor. If you have any of these signs, call your health care provider right away, even if it is before your due date. Follow these instructions at home: Medicines  Follow your health care provider's instructions regarding medicine use. Specific medicines may be either safe or unsafe to take during pregnancy.  Take a prenatal vitamin that contains at least 600 micrograms (mcg) of folic acid.  If you develop constipation, try taking a stool softener if your health care provider approves. Eating and drinking   Eat a balanced diet that includes fresh fruits and vegetables, whole grains, good sources of protein such as meat, eggs, or tofu,  and low-fat dairy. Your health care provider will help you determine the amount of weight gain that is right for you.  Avoid raw meat and uncooked cheese. These carry germs that can cause birth defects in the baby.  If you have low calcium intake from food, talk to your health care provider about whether you should take a daily calcium supplement.  Eat four or five small meals rather than three large meals a day.  Limit foods that are high in fat and processed sugars, such as fried and sweet foods.  To prevent constipation: ? Drink enough fluid to keep your urine clear or pale yellow. ? Eat foods that are high in fiber, such as fresh fruits and vegetables, whole grains, and beans. Activity  Exercise only as directed by your health care provider. Most women can continue their usual exercise routine during pregnancy. Try to exercise for 30 minutes at least 5 days a week. Stop exercising if you experience uterine contractions.  Avoid heavy lifting.  Do   not exercise in extreme heat or humidity, or at high altitudes.  Wear low-heel, comfortable shoes.  Practice good posture.  You may continue to have sex unless your health care provider tells you otherwise. Relieving pain and discomfort  Take frequent breaks and rest with your legs elevated if you have leg cramps or low back pain.  Take warm sitz baths to soothe any pain or discomfort caused by hemorrhoids. Use hemorrhoid cream if your health care provider approves.  Wear a good support bra to prevent discomfort from breast tenderness.  If you develop varicose veins: ? Wear support pantyhose or compression stockings as told by your healthcare provider. ? Elevate your feet for 15 minutes, 3-4 times a day. Prenatal care  Write down your questions. Take them to your prenatal visits.  Keep all your prenatal visits as told by your health care provider. This is important. Safety  Wear your seat belt at all times when driving.  Make  a list of emergency phone numbers, including numbers for family, friends, the hospital, and police and fire departments. General instructions  Avoid cat litter boxes and soil used by cats. These carry germs that can cause birth defects in the baby. If you have a cat, ask someone to clean the litter box for you.  Do not travel far distances unless it is absolutely necessary and only with the approval of your health care provider.  Do not use hot tubs, steam rooms, or saunas.  Do not drink alcohol.  Do not use any products that contain nicotine or tobacco, such as cigarettes and e-cigarettes. If you need help quitting, ask your health care provider.  Do not use any medicinal herbs or unprescribed drugs. These chemicals affect the formation and growth of the baby.  Do not douche or use tampons or scented sanitary pads.  Do not cross your legs for long periods of time.  To prepare for the arrival of your baby: ? Take prenatal classes to understand, practice, and ask questions about labor and delivery. ? Make a trial run to the hospital. ? Visit the hospital and tour the maternity area. ? Arrange for maternity or paternity leave through employers. ? Arrange for family and friends to take care of pets while you are in the hospital. ? Purchase a rear-facing car seat and make sure you know how to install it in your car. ? Pack your hospital bag. ? Prepare the baby's nursery. Make sure to remove all pillows and stuffed animals from the baby's crib to prevent suffocation.  Visit your dentist if you have not gone during your pregnancy. Use a soft toothbrush to brush your teeth and be gentle when you floss. Contact a health care provider if:  You are unsure if you are in labor or if your water has broken.  You become dizzy.  You have mild pelvic cramps, pelvic pressure, or nagging pain in your abdominal area.  You have lower back pain.  You have persistent nausea, vomiting, or diarrhea.   You have an unusual or bad smelling vaginal discharge.  You have pain when you urinate. Get help right away if:  Your water breaks before 37 weeks.  You have regular contractions less than 5 minutes apart before 37 weeks.  You have a fever.  You are leaking fluid from your vagina.  You have spotting or bleeding from your vagina.  You have severe abdominal pain or cramping.  You have rapid weight loss or weight gain.  You have  shortness of breath with chest pain.  You notice sudden or extreme swelling of your face, hands, ankles, feet, or legs.  Your baby makes fewer than 10 movements in 2 hours.  You have severe headaches that do not go away when you take medicine.  You have vision changes. Summary  The third trimester is from week 28 through week 40, months 7 through 9. The third trimester is a time when the unborn baby (fetus) is growing rapidly.  During the third trimester, your discomfort may increase as you and your baby continue to gain weight. You may have abdominal, leg, and back pain, sleeping problems, and an increased need to urinate.  During the third trimester your breasts will keep growing and they will continue to become tender. A yellow fluid (colostrum) may leak from your breasts. This is the first milk you are producing for your baby.  False labor is a condition in which you feel small, irregular tightenings of the muscles in the womb (contractions) that eventually go away. These are called Braxton Hicks contractions. Contractions may last for hours, days, or even weeks before true labor sets in.  Signs of labor can include: abdominal cramps; regular contractions that start at 10 minutes apart and become stronger and more frequent with time; watery or bloody mucus discharge that comes from the vagina; increased pelvic pressure and dull back pain; and leaking of amniotic fluid. This information is not intended to replace advice given to you by your health  care provider. Make sure you discuss any questions you have with your health care provider. Document Released: 10/23/2001 Document Revised: 02/19/2019 Document Reviewed: 12/04/2016 Elsevier Patient Education  2020 [employer]  

## 2019-08-17 NOTE — Progress Notes (Signed)
C/o found out she will need to be going to [facility] twice a week.rj

## 2019-08-20 ENCOUNTER — Other Ambulatory Visit: Payer: Self-pay

## 2019-08-20 DIAGNOSIS — D259 Leiomyoma of uterus, unspecified: Secondary | ICD-10-CM

## 2019-08-24 ENCOUNTER — Ambulatory Visit
Admission: RE | Admit: 2019-08-24 | Discharge: 2019-08-24 | Disposition: A | Discharge status: Home / Self Care | Payer: 59 | Source: Ambulatory Visit | Attending: Obstetrics and Gynecology | Admitting: Obstetrics and Gynecology

## 2019-08-24 ENCOUNTER — Other Ambulatory Visit: Payer: Self-pay

## 2019-08-24 DIAGNOSIS — O3413 Maternal care for benign tumor of corpus uteri, third trimester: Secondary | ICD-10-CM | POA: Diagnosis present

## 2019-08-24 DIAGNOSIS — Z3A30 30 weeks gestation of pregnancy: Secondary | ICD-10-CM | POA: Insufficient documentation

## 2019-08-24 DIAGNOSIS — D259 Leiomyoma of uterus, unspecified: Secondary | ICD-10-CM | POA: Insufficient documentation

## 2019-08-27 ENCOUNTER — Ambulatory Visit
Admission: RE | Admit: 2019-08-27 | Discharge: 2019-08-27 | Disposition: A | Discharge status: Home / Self Care | Payer: 59 | Source: Ambulatory Visit | Attending: Maternal & Fetal Medicine | Admitting: Maternal & Fetal Medicine

## 2019-08-27 ENCOUNTER — Other Ambulatory Visit: Payer: Self-pay

## 2019-08-27 DIAGNOSIS — O365930 Maternal care for other known or suspected poor fetal growth, third trimester, not applicable or unspecified: Secondary | ICD-10-CM | POA: Insufficient documentation

## 2019-08-27 DIAGNOSIS — Z3A30 30 weeks gestation of pregnancy: Secondary | ICD-10-CM | POA: Diagnosis not present

## 2019-08-27 DIAGNOSIS — O09519 Supervision of elderly primigravida, unspecified trimester: Secondary | ICD-10-CM

## 2019-08-31 ENCOUNTER — Ambulatory Visit
Admission: RE | Admit: 2019-08-31 | Discharge: 2019-08-31 | Disposition: A | Discharge status: Home / Self Care | Payer: 59 | Source: Ambulatory Visit | Attending: Obstetrics and Gynecology | Admitting: Obstetrics and Gynecology

## 2019-08-31 ENCOUNTER — Other Ambulatory Visit: Payer: Self-pay

## 2019-08-31 VITALS — BP 120/73 | HR 99 | Temp 98.2°F | Resp 18 | Ht 67.0 in | Wt 216.5 lb

## 2019-08-31 DIAGNOSIS — O09513 Supervision of elderly primigravida, third trimester: Secondary | ICD-10-CM | POA: Diagnosis not present

## 2019-08-31 DIAGNOSIS — O365930 Maternal care for other known or suspected poor fetal growth, third trimester, not applicable or unspecified: Secondary | ICD-10-CM

## 2019-08-31 DIAGNOSIS — Z3A31 31 weeks gestation of pregnancy: Secondary | ICD-10-CM | POA: Insufficient documentation

## 2019-08-31 DIAGNOSIS — O09519 Supervision of elderly primigravida, unspecified trimester: Secondary | ICD-10-CM

## 2019-08-31 NOTE — Progress Notes (Signed)
Mentor note  38 year old gravida 3 para 0-0-2-0 married African-American female who works at [employer] currently 31 weeks and 3 days Patient has advanced maternal age and uterine fibroids but otherwise is medically uncomplicated On 10 5 the fetus was found to be less than the 3rd percentile with a 2-1/2-week lag in  Growth Her Dopplers last week were noted to be elevated Today her BPP was 8 out of 8 the fluid looks normal On 1 Doppler run there was no diastolic flow the next 4 all showed elevated Dopplers but with end-diastolic flow present  We will add her on for a repeat Doppler study on Thursday to confirm diastolic flow Advised to report to the hospital for bleeding or decreased fetal movement Told her she could continue to work but should rest as able We reviewed that if the fluid were to decrease the biophysical profile to be abnormal or if the end-diastolic flow was absent or reversed that she would need to be admitted to the hospital for steroids and possible delivery  Gatha Mayer

## 2019-09-03 ENCOUNTER — Observation Stay
Admission: RE | Admit: 2019-09-03 | Discharge: 2019-09-03 | Disposition: A | Discharge status: Home / Self Care | Payer: 59 | Attending: Obstetrics and Gynecology | Admitting: Obstetrics and Gynecology

## 2019-09-03 ENCOUNTER — Other Ambulatory Visit: Payer: Self-pay | Admitting: Obstetrics and Gynecology

## 2019-09-03 ENCOUNTER — Other Ambulatory Visit: Payer: Self-pay

## 2019-09-03 ENCOUNTER — Ambulatory Visit
Admission: RE | Admit: 2019-09-03 | Discharge: 2019-09-03 | Disposition: A | Discharge status: Home / Self Care | Payer: 59 | Source: Ambulatory Visit | Attending: Maternal & Fetal Medicine | Admitting: Maternal & Fetal Medicine

## 2019-09-03 ENCOUNTER — Other Ambulatory Visit: Payer: 59

## 2019-09-03 ENCOUNTER — Other Ambulatory Visit: Payer: Self-pay | Admitting: Maternal & Fetal Medicine

## 2019-09-03 DIAGNOSIS — O365930 Maternal care for other known or suspected poor fetal growth, third trimester, not applicable or unspecified: Secondary | ICD-10-CM | POA: Diagnosis not present

## 2019-09-03 DIAGNOSIS — O1493 Unspecified pre-eclampsia, third trimester: Secondary | ICD-10-CM | POA: Diagnosis not present

## 2019-09-03 DIAGNOSIS — O09519 Supervision of elderly primigravida, unspecified trimester: Secondary | ICD-10-CM

## 2019-09-03 DIAGNOSIS — O1403 Mild to moderate pre-eclampsia, third trimester: Secondary | ICD-10-CM | POA: Diagnosis not present

## 2019-09-03 DIAGNOSIS — O365990 Maternal care for other known or suspected poor fetal growth, unspecified trimester, not applicable or unspecified: Secondary | ICD-10-CM | POA: Diagnosis present

## 2019-09-03 DIAGNOSIS — O09523 Supervision of elderly multigravida, third trimester: Secondary | ICD-10-CM | POA: Insufficient documentation

## 2019-09-03 DIAGNOSIS — Z3A31 31 weeks gestation of pregnancy: Secondary | ICD-10-CM | POA: Insufficient documentation

## 2019-09-03 LAB — CBC
HCT: 35.6 % — ABNORMAL LOW (ref 36.0–46.0)
Hemoglobin: 11.7 g/dL — ABNORMAL LOW (ref 12.0–15.0)
MCH: 27.9 pg (ref 26.0–34.0)
MCHC: 32.9 g/dL (ref 30.0–36.0)
MCV: 85 fL (ref 80.0–100.0)
Platelets: 177 10*3/uL (ref 150–400)
RBC: 4.19 MIL/uL (ref 3.87–5.11)
RDW: 13.4 % (ref 11.5–15.5)
WBC: 8.6 10*3/uL (ref 4.0–10.5)
nRBC: 0.5 % — ABNORMAL HIGH (ref 0.0–0.2)

## 2019-09-03 LAB — COMPREHENSIVE METABOLIC PANEL
ALT: 15 U/L (ref 0–44)
AST: 21 U/L (ref 15–41)
Albumin: 2.7 g/dL — ABNORMAL LOW (ref 3.5–5.0)
Alkaline Phosphatase: 140 U/L — ABNORMAL HIGH (ref 38–126)
Anion gap: 8 (ref 5–15)
BUN: 6 mg/dL (ref 6–20)
CO2: 23 mmol/L (ref 22–32)
Calcium: 8.7 mg/dL — ABNORMAL LOW (ref 8.9–10.3)
Chloride: 105 mmol/L (ref 98–111)
Creatinine, Ser: 0.5 mg/dL (ref 0.44–1.00)
GFR calc Af Amer: >60 mL/min (ref >60)
GFR calc non Af Amer: >60 mL/min (ref >60)
Glucose, Bld: 133 mg/dL — ABNORMAL HIGH (ref 70–99)
Potassium: 3.9 mmol/L (ref 3.5–5.1)
Sodium: 136 mmol/L (ref 135–145)
Total Bilirubin: 0.3 mg/dL (ref 0.3–1.2)
Total Protein: 5.8 g/dL — ABNORMAL LOW (ref 6.5–8.1)

## 2019-09-03 LAB — PROTEIN / CREATININE RATIO, URINE
Creatinine, Urine: 127 mg/dL
Protein Creatinine Ratio: 1.39 mg/mg{Cre} — ABNORMAL HIGH (ref 0.00–0.15)
Total Protein, Urine: 176 mg/dL

## 2019-09-03 MED ORDER — BETAMETHASONE SOD PHOS & ACET 6 (3-3) MG/ML IJ SUSP
12.0000 mg | INTRAMUSCULAR | Status: DC
  Administered 2019-09-03: 12 mg via INTRAMUSCULAR

## 2019-09-03 MED ORDER — BETAMETHASONE SOD PHOS & ACET 6 (3-3) MG/ML IJ SUSP
INTRAMUSCULAR | Status: AC
  Filled 2019-09-03: qty 5

## 2019-09-03 MED ORDER — BLOOD PRESSURE KIT: 1.0000 | PACK | Freq: Two times a day (BID) | 0 refills | Status: DC

## 2019-09-03 NOTE — Discharge Instructions (Signed)
Preeclampsia and Eclampsia °Preeclampsia is a serious condition that may develop during pregnancy. This condition causes high blood pressure and increased protein in your urine along with other symptoms, such as headaches and vision changes. These symptoms may develop as the condition gets worse. Preeclampsia may occur at 20 weeks of pregnancy or later. °Diagnosing and treating preeclampsia early is very important. If not treated early, it can cause serious problems for you and your baby. One problem it can lead to is eclampsia. Eclampsia is a condition that causes muscle jerking or shaking (convulsions or seizures) and other serious problems for the mother. During pregnancy, delivering your baby may be the best treatment for preeclampsia or eclampsia. For most women, preeclampsia and eclampsia symptoms go away after giving birth. °In rare cases, a woman may develop preeclampsia after giving birth (postpartum preeclampsia). This usually occurs within 48 hours after childbirth but may occur up to 6 weeks after giving birth. °What are the causes? °The cause of preeclampsia is not known. °What increases the risk? °The following risk factors make you more likely to develop preeclampsia: °· Being pregnant for the first time. °· Having had preeclampsia during a past pregnancy. °· Having a family history of preeclampsia. °· Having high blood pressure. °· Being pregnant with more than one baby. °· Being 35 or older. °· Being African-American. °· Having kidney disease or diabetes. °· Having medical conditions such as lupus or blood diseases. °· Being very overweight (obese). °What are the signs or symptoms? °The most common symptoms are: °· Severe headaches. °· Vision problems, such as blurred or double vision. °· Abdominal pain, especially upper abdominal pain. °Other symptoms that may develop as the condition gets worse include: °· Sudden weight gain. °· Sudden swelling of the hands, face, legs, and feet. °· Severe nausea  and vomiting. °· Numbness in the face, arms, legs, and feet. °· Dizziness. °· Urinating less than usual. °· Slurred speech. °· Convulsions or seizures. °How is this diagnosed? °There are no screening tests for preeclampsia. Your health care provider will ask you about symptoms and check for signs of preeclampsia during your prenatal visits. You may also have tests that include: °· Checking your blood pressure. °· Urine tests to check for protein. Your health care provider will check for this at every prenatal visit. °· Blood tests. °· Monitoring your baby's heart rate. °· Ultrasound. °How is this treated? °You and your health care provider will determine the treatment approach that is best for you. Treatment may include: °· Having more frequent prenatal exams to check for signs of preeclampsia, if you have an increased risk for preeclampsia. °· Medicine to lower your blood pressure. °· Staying in the hospital, if your condition is severe. There, treatment will focus on controlling your blood pressure and the amount of fluids in your body (fluid retention). °· Taking medicine (magnesium sulfate) to prevent seizures. This may be given as an injection or through an IV. °· Taking a low-dose aspirin during your pregnancy. °· Delivering your baby early. You may have your labor started with medicine (induced), or you may have a cesarean delivery. °Follow these instructions at home: °Eating and drinking ° °· Drink enough fluid to keep your urine pale yellow. °· Avoid caffeine. °Lifestyle °· Do not use any products that contain nicotine or tobacco, such as cigarettes and e-cigarettes. If you need help quitting, ask your health care provider. °· Do not use alcohol or drugs. °· Avoid stress as much as possible. Rest and get   plenty of sleep. °General instructions °· Take over-the-counter and prescription medicines only as told by your health care provider. °· When lying down, lie on your left side. This keeps pressure off your  major blood vessels. °· When sitting or lying down, raise (elevate) your feet. Try putting some pillows underneath your lower legs. °· Exercise regularly. Ask your health care provider what kinds of exercise are best for you. °· Keep all follow-up and prenatal visits as told by your health care provider. This is important. °How is this prevented? °There is no known way of preventing preeclampsia or eclampsia from developing. However, to lower your risk of complications and detect problems early: °· Get regular prenatal care. Your health care provider may be able to diagnose and treat the condition early. °· Maintain a healthy weight. Ask your health care provider for help managing weight gain during pregnancy. °· Work with your health care provider to manage any long-term (chronic) health conditions you have, such as diabetes or kidney problems. °· You may have tests of your blood pressure and kidney function after giving birth. °· Your health care provider may have you take low-dose aspirin during your next pregnancy. °Contact a health care provider if: °· You have symptoms that your health care provider told you may require more treatment or monitoring, such as: °? Headaches. °? Nausea or vomiting. °? Abdominal pain. °? Dizziness. °? Light-headedness. °Get help right away if: °· You have severe: °? Abdominal pain. °? Headaches that do not get better. °? Dizziness. °? Vision problems. °? Confusion. °? Nausea or vomiting. °· You have any of the following: °? A seizure. °? Sudden, rapid weight gain. °? Sudden swelling in your hands, ankles, or face. °? Trouble moving any part of your body. °? Numbness in any part of your body. °? Trouble speaking. °? Abnormal bleeding. °· You faint. °Summary °· Preeclampsia is a serious condition that may develop during pregnancy. °· This condition causes high blood pressure and increased protein in your urine along with other symptoms, such as headaches and vision  changes. °· Diagnosing and treating preeclampsia early is very important. If not treated early, it can cause serious problems for you and your baby. °· Get help right away if you have symptoms that your health care provider told you to watch for. °This information is not intended to replace advice given to you by your health care provider. Make sure you discuss any questions you have with your health care provider. °Document Released: 10/26/2000 Document Revised: 07/01/2018 Document Reviewed: 06/04/2016 °Elsevier Patient Education © 2020 Elsevier Inc. ° °

## 2019-09-03 NOTE — OB Triage Note (Signed)
Pt sent up from [facility] for increased blood pressure, IUGR, absent end diastolic flow on doppler

## 2019-09-03 NOTE — OB Triage Note (Signed)
Discharge instructions provided and reviewed.  Work note given.  Follow up care and precautions discussed.  Pt verbalizes understanding.

## 2019-09-03 NOTE — Progress Notes (Signed)
Pt had appointment at [facility] today for BPP and Dopplers.  Post [place] Dr. Diamantina Monks wanted pt sent to [facility] for Observation of BP, Monitoring and Labs.  Pt transported to [facility] 4 via wheelchair by RN.  Report given to Reece Leader, RN.

## 2019-09-03 NOTE — Discharge Summary (Signed)
Physician Discharge Summary   Patient ID: Toni Byrd [id number] 38 y.o. 1981-07-30  Admit date: 09/03/2019  Discharge date and time: No discharge date for patient encounter.   Admitting Physician: Homero Fellers, MD   Discharge Physician: Adrian Prows MD  Admission Diagnoses: No admission diagnoses are documented for this encounter.  Discharge Diagnoses: Preeclampsia, IUGR, elevated UA dopplers  Admission Condition: good  Discharged Condition: good  Indication for Admission: evaluation of preeclampsia  Hospital Course: Patient was admitted for a preeclampsia work up. She was diagnosed with preeclampsia with moderate range BP. She was given betamethasone. She was monitored. BP were stable. She was discharged home with plans for close follow up tomorrow. Discussed how to take BP at home and warning signs of preeclampsia.   Consults: None  Significant Diagnostic Studies: labs:  See Epic record  Treatments: betamethasone  Discharge Exam: BP 140/84   Pulse 98   Temp 98 F (36.7 C) (Oral)   Resp 12   LMP 01/23/2019 (Exact Date)   General Appearance:    Alert, cooperative, no distress, appears stated age  Head:    Normocephalic, without obvious abnormality, atraumatic  Eyes:    PERRL, conjunctiva/corneas clear, EOM's intact, fundi    benign, both eyes  Ears:    Normal TM's and external ear canals, both ears  Nose:   Nares normal, septum midline, mucosa normal, no drainage    or sinus tenderness  Throat:   Lips, mucosa, and tongue normal; teeth and gums normal  Neck:   Supple, symmetrical, trachea midline, no adenopathy;    thyroid:  no enlargement/tenderness/nodules; no carotid   bruit or JVD  Back:     Symmetric, no curvature, ROM normal, no CVA tenderness  Lungs:     Clear to auscultation bilaterally, respirations unlabored  Chest Wall:    No tenderness or deformity   Heart:    Regular rate and rhythm, S1 and S2 normal, no murmur, rub   or gallop   Breast Exam:    No tenderness, masses, or nipple abnormality  Abdomen:     Soft, non-tender, bowel sounds active all four quadrants,    no masses, no organomegaly  Genitalia:    Normal female without lesion, discharge or tenderness  Rectal:    Normal tone, normal prostate, no masses or tenderness;   guaiac negative stool  Extremities:   Extremities normal, atraumatic, no cyanosis or edema  Pulses:   2+ and symmetric all extremities  Skin:   Skin color, texture, turgor normal, no rashes or lesions  Lymph nodes:   Cervical, supraclavicular, and axillary nodes normal  Neurologic:   CNII-XII intact, normal strength, sensation and reflexes    throughout    Disposition:  There are no questions and answers to display.        Patient Instructions:  Allergies as of 09/03/2019      Reactions   Other Rash   ADHESIVE FROM BANDAIDS-"BURNS SKIN"      Medication List    TAKE these medications   Blood Pressure Kit 1 each by Does not apply route 2 (two) times daily.   ferrous sulfate 325 (65 FE) MG tablet Take 325 mg by mouth daily with breakfast.   multivitamin-prenatal 27-0.8 MG Tabs tablet Take 1 tablet by mouth daily at 12 noon.   vitamin C 100 MG tablet Take 150 mg by mouth daily.      Activity: activity as tolerated Diet: regular diet Wound Care: none needed  Follow-up with  Labor and delivery in 1 day.  Signed: Homero Fellers 09/03/2019 9:32 PM

## 2019-09-03 NOTE — H&P (Signed)
History and Physical  Toni Byrd is an 38 y.o. female.  HPI: Patient was sent from MFM for elevated BP. She had an [place] there today which showed intermittent absent flow in the umbilical artery. She is feeling well. She denies headache, RUQ pain or visual changes. She does report she had floaters earlier this week. She has some back pain.   THIRD Problems (from 03/03/19 to present)    Problem Noted Resolved   Poor fetal growth affecting management of mother in third trimester 08/17/2019 by Wynona Neat, MD No   Overview Addendum 08/17/2019 10:31 AM by Wynona Neat, MD    EFW  On 10/5 at [redacted]w[redacted]d <3rd% with AC <3rd%; elevated cord Dopplers, BPP 8/8 Recommend 2x weekly antenatal testing with weekly AFI/cord Dopplers and growth in 3 weeks. If umbilical artery Dopplers demonstrate absent or REDF, recommend inpatient monitoring and administration of steroids.      Uterine fibroids affecting pregnancy 04/09/2019 by Gae Dry, MD No   Overview Signed 04/09/2019  2:17 PM by Gae Dry, MD       Risks of PTL, pain, bleeding discussed    Location of fibroid fundal, unlikely obstructive for labor    Prior myomectomy, but OK for vaginal delivery per pt        Supervision of high risk pregnancy, antepartum 03/03/2019 by Dalia Heading, CNM No   Overview Addendum 07/06/2019  9:00 AM by Gae Dry, MD    [facility]atal [facility]  Dating L=8 Blood type: O/Positive/-- (04/21 1439)   Genetic Screen NIPS:nml XY Antibody:Negative (04/21 1439)  Anatomic [place] WSOG Rubella: 8.61 (04/21 1439) Varicella: Im  GTT Early:high;  3 hr GTT:         Third trimester:  RPR: Non Reactive (04/21 1439)   Rhogam O+ HBsAg: Negative (04/21 1439)   TDaP vaccine     Flu Shot: 07/06/19 HIV: Non Reactive (04/21 1439)   Baby Food Breast at first GBS:   Contraception None, planning for pregnancy soon after delivery Pap: 11/20/2016 NLIM/HPV Neg  CBB  No   CS/VBAC Vaginal labor ok (prior myomectomy)    Support Person Husband                  Past Medical History:  Diagnosis Date  . Bacterial vaginitis   . Dysmenorrhea   . Fibroid   . Hydrosalpinx   . Spontaneous abortion   . Trichomoniasis     Past Surgical History:  Procedure Laterality Date  . DILATATION & CURETTAGE/HYSTEROSCOPY WITH MYOSURE N/A 01/23/2018   Procedure: DILATATION & CURETTAGE/HYSTEROSCOPY WITH MYOSURE,REMOVAL OF UTERINE LESION;  Surgeon: Will Bonnet, MD;  Location: [facility] ORS;  Service: Gynecology;  Laterality: N/A;  . LASIK      Family History  Problem Relation Age of Onset  . Rheum arthritis Mother   . Hypothyroidism Mother   . Hypertension Mother   . Thyroid disease Mother   . Diabetes Father   . Prostate cancer Father 64  . Breast cancer Maternal Grandmother 80    Social History:  reports that she has never smoked. She has never used smokeless tobacco. She reports previous alcohol use. She reports that she does not use drugs.  Allergies:  Allergies  Allergen Reactions  . Other Rash    ADHESIVE FROM BANDAIDS-"BURNS SKIN"    Medications: I have reviewed the patient's current medications.  No results found for this or any previous visit (from the past 48 hour(s)).  [place] Mfm Ua  Doppler Re-eval  Result Date: 09/03/2019 ----------------------------------------------------------------------  OBSTETRICS REPORT                       (Signed Final 09/03/2019 03:51 pm) ---------------------------------------------------------------------- PATIENT INFO:  ID #:       [id number]                          D.O.B.:  06-28-1981 (38 yrs)  Name:       Toni Byrd              Visit Date: 09/03/2019 03:17 pm ---------------------------------------------------------------------- PERFORMED BY:  Performed By:     Christena Deem RDMS        Ref. Address:     [address], [place],                                                             [place]  [address]  Referred By:      Will Bonnet MD ---------------------------------------------------------------------- SERVICE(S) PROVIDED:   [place] MFM UA DOPPLER RE-EVAL                            [phone]  ---------------------------------------------------------------------- INDICATIONS:   [redacted] weeks gestation of pregnancy                Z3A.31  ---------------------------------------------------------------------- FETAL EVALUATION:  Num Of Fetuses:         1  Fetal Heart Rate(bpm):  143  Cardiac Activity:       Present  Presentation:           Vertex  Placenta:               Anterior , fundal  AFI Sum(cm)     %Tile       Largest Pocket(cm)  10.05           16          3.72  RUQ(cm)       RLQ(cm)       LUQ(cm)        LLQ(cm)  2.52          3.72          1.17           2.64 ---------------------------------------------------------------------- BIOPHYSICAL EVALUATION:  Amniotic F.V:   Within normal limits       F. Tone:        Observed  F. Movement:    Observed                   Score:          8/8  F. Breathing:   Observed ---------------------------------------------------------------------- GESTATIONAL AGE:  LMP:  31w 6d        Date:  01/23/19                 EDD:   10/30/19  Best:          Burke Keels 6d     Det. By:  LMP  (01/23/19)          EDD:   10/30/19 ---------------------------------------------------------------------- DOPPLER - FETAL VESSELS:  Umbilical Artery   S/D     %tile     RI    %tile                     PSV                                                   (cm/s)  71.2    > 97.5  0.99    > 97.5                     35.6 ---------------------------------------------------------------------- IMPRESSION:  Thank you for referring your patient for BPP due to fetal  growth restriction.  BP is 144/98.  She has no clinical sxs c/w severe preeE.  (initial bp 139/77)  There is a singleton gestation with normal amniotic fluid  volume.  The BPP was noted to be 8/8, which was reassuring,   however, intermittent absent end diastolic flow was seen in  umbilical dopplers.  I have referred her to birthing center for preeclampsia  evaluation as discussed with Dr. Gilman Schmidt.  If ruled out, outpatient steroids can be administered and  follow up can continue here twice weekly (she has growth  scan scheduled)  if she has preeclampsia I would recommend steroids (can  complete as outpatient) and plan delivery in 34th week  (unless indicated sooner)  Thank you for allowing [place] to participate in your patient's care.  Please do not hesitate to contact us if we can be of further  assistance. ----------------------------------------------------------------------                   Manfred Shirts, MD Electronically Signed Final Report   09/03/2019 03:51 pm ----------------------------------------------------------------------   Review of Systems  Constitutional: Negative for chills, fever, malaise/fatigue and weight loss.  HENT: Negative for congestion, hearing loss and sinus pain.   Eyes: Negative for blurred vision and double vision.  Respiratory: Negative for cough, sputum production, shortness of breath and wheezing.   Cardiovascular: Negative for chest pain, palpitations, orthopnea and leg swelling.  Gastrointestinal: Negative for abdominal pain, constipation, diarrhea, nausea and vomiting.  Genitourinary: Negative for dysuria, flank pain, frequency, hematuria and urgency.  Musculoskeletal: Negative for back pain, falls and joint pain.  Skin: Negative for itching and rash.  Neurological: Negative for dizziness and headaches.  Psychiatric/Behavioral: Negative for depression, substance abuse and suicidal ideas. The patient is not nervous/anxious.    Last menstrual period 01/23/2019, unknown if currently breastfeeding. Physical Exam  Nursing note and vitals reviewed. Constitutional: She is oriented to person, place, and time. She appears well-developed and well-nourished.  HENT:  Head: Normocephalic  and atraumatic.  Cardiovascular: Normal rate and regular rhythm.  Respiratory: Effort normal and breath sounds normal.  GI: Soft. Bowel sounds are normal.  Musculoskeletal: Normal range of motion.  Neurological: She is alert and oriented to person, place, and time.  Skin: Skin  is warm and dry.  Psychiatric: She has a normal mood and affect. Her behavior is normal. Judgment and thought content normal.    Assessment/Plan: 38 yo G3P0020 [redacted]w[redacted]d 1. Elevated BP- will rule out preeclampsia, labs ordered. Monitor BP. 2. IUGR with intermittent absent flow and elevated - will give betamethasone, patient has follow up US planned for Monday with [facility]  3. Regular diet  Update:  Labs and elevated blood pressures have confirmed preeclampsia. Per Dr. Thom Chimes direction will discharge patient. She will return tomorrow for her second betamethasone injection. She will obtain a blood pressure home monitor. Warning signs of preeclampsia were discussed in detail. Patient asked to return to the hospital if she experiences these symptoms or if her blood pressure is greater than or equal to 160/110. Tentative delivery plans for 34 weeks or sooner if indicated.  Will provide patient with note to be out of work for remaining duration of pregnancy.   She has close follow up planned for Friday and Monday.     R  09/03/2019, 4:50 PM

## 2019-09-04 ENCOUNTER — Observation Stay
Admission: EM | Admit: 2019-09-04 | Discharge: 2019-09-04 | Disposition: A | Discharge status: Home / Self Care | Payer: 59 | Attending: Obstetrics and Gynecology | Admitting: Obstetrics and Gynecology

## 2019-09-04 ENCOUNTER — Telehealth: Payer: Self-pay | Admitting: Obstetrics and Gynecology

## 2019-09-04 ENCOUNTER — Other Ambulatory Visit: Payer: Self-pay

## 2019-09-04 DIAGNOSIS — O365990 Maternal care for other known or suspected poor fetal growth, unspecified trimester, not applicable or unspecified: Secondary | ICD-10-CM | POA: Diagnosis present

## 2019-09-04 DIAGNOSIS — O3413 Maternal care for benign tumor of corpus uteri, third trimester: Secondary | ICD-10-CM

## 2019-09-04 DIAGNOSIS — O09523 Supervision of elderly multigravida, third trimester: Secondary | ICD-10-CM | POA: Diagnosis not present

## 2019-09-04 DIAGNOSIS — O1493 Unspecified pre-eclampsia, third trimester: Secondary | ICD-10-CM | POA: Diagnosis not present

## 2019-09-04 DIAGNOSIS — Z803 Family history of malignant neoplasm of breast: Secondary | ICD-10-CM | POA: Insufficient documentation

## 2019-09-04 DIAGNOSIS — O365930 Maternal care for other known or suspected poor fetal growth, third trimester, not applicable or unspecified: Principal | ICD-10-CM | POA: Insufficient documentation

## 2019-09-04 DIAGNOSIS — D259 Leiomyoma of uterus, unspecified: Secondary | ICD-10-CM

## 2019-09-04 DIAGNOSIS — O0990 Supervision of high risk pregnancy, unspecified, unspecified trimester: Secondary | ICD-10-CM

## 2019-09-04 DIAGNOSIS — Z3A31 31 weeks gestation of pregnancy: Secondary | ICD-10-CM

## 2019-09-04 DIAGNOSIS — Z3A32 32 weeks gestation of pregnancy: Secondary | ICD-10-CM | POA: Diagnosis not present

## 2019-09-04 DIAGNOSIS — O1403 Mild to moderate pre-eclampsia, third trimester: Secondary | ICD-10-CM

## 2019-09-04 MED ORDER — BETAMETHASONE SOD PHOS & ACET 6 (3-3) MG/ML IJ SUSP
INTRAMUSCULAR | Status: AC
  Filled 2019-09-04: qty 5

## 2019-09-04 MED ORDER — BETAMETHASONE SOD PHOS & ACET 6 (3-3) MG/ML IJ SUSP
12.0000 mg | Freq: Once | INTRAMUSCULAR | Status: AC
  Administered 2019-09-04: 12 mg via INTRAMUSCULAR

## 2019-09-04 NOTE — Discharge Summary (Addendum)
Physician Final Progress Note  Patient ID: Toni Byrd MRN: [id number] DOB/AGE: 04-12-1981 38 y.o.  Admit date: 09/04/2019 Admitting provider: Malachy Mood, MD Discharge date: 09/04/2019   Admission Diagnoses: IUGR, preeclampsia, elevated UA dopplers, need for betamethasone injection   Discharge Diagnoses:  Active Problems:   IUGR (intrauterine growth restriction) affecting care of mother   Preeclampsia, third trimester IUP at 32 weeks Reactive NST Mild range blood pressures  History of Present Illness: The patient is a 38 y.o. female G3P0020 at 44w0dwho presents for second dose of betamethasone. Recommended that patient stay for a period of blood pressure and fetal monitoring. Patient is agreeable to plan. She admits good fetal movement. She denies contractions, leakage of fluid or vaginal bleeding. She denies headaches, visual changes or epigastric pain.     Past Medical History:  Diagnosis Date  . Bacterial vaginitis   . Dysmenorrhea   . Fibroid   . Hydrosalpinx   . Spontaneous abortion   . Trichomoniasis     Past Surgical History:  Procedure Laterality Date  . DILATATION & CURETTAGE/HYSTEROSCOPY WITH MYOSURE N/A 01/23/2018   Procedure: DILATATION & CURETTAGE/HYSTEROSCOPY WITH MYOSURE,REMOVAL OF UTERINE LESION;  Surgeon: JWill Bonnet MD;  Location: [facility] ORS;  Service: Gynecology;  Laterality: N/A;  . LASIK      No current facility-administered medications on file prior to encounter.    Current Outpatient Medications on File Prior to Encounter  Medication Sig Dispense Refill  . Ascorbic Acid (VITAMIN C) 100 MG tablet Take 150 mg by mouth daily.    . ferrous sulfate 325 (65 FE) MG tablet Take 325 mg by mouth daily with breakfast.    . Prenatal Vit-Fe Fumarate-FA (MULTIVITAMIN-PRENATAL) 27-0.8 MG TABS tablet Take 1 tablet by mouth daily at 12 noon.    . Blood Pressure KIT 1 each by Does not apply route 2 (two) times daily. 1 kit 0    Allergies   Allergen Reactions  . Other Rash    ADHESIVE FROM BANDAIDS-"BURNS SKIN"    Social History   Socioeconomic History  . Marital status: Married    Spouse name: EJaquelyn Bitter . Number of children: Not on file  . Years of education: Not on file  . Highest education level: Not on file  Occupational History  . Not on file  Social Needs  . Financial resource strain: Not on file  . Food insecurity    Worry: Not on file    Inability: Not on file  . Transportation needs    Medical: Not on file    Non-medical: Not on file  Tobacco Use  . Smoking status: Never Smoker  . Smokeless tobacco: Never Used  Substance and Sexual Activity  . Alcohol use: Not Currently    Comment: occ  . Drug use: No  . Sexual activity: Yes    Partners: Male    Birth control/protection: None  Lifestyle  . Physical activity    Days per week: 0 days    Minutes per session: Not on file  . Stress: Not on file  Relationships  . Social [place] phone: Not on file    Gets together: Not on file    Attends religious service: Not on file    Active member of club or organization: Not on file    Attends meetings of clubs or organizations: Not on file    Relationship status: Not on file  . Intimate partner violence    Fear of current  or ex partner: Not on file    Emotionally abused: Not on file    Physically abused: Not on file    Forced sexual activity: Not on file  Other Topics Concern  . Not on file  Social History Narrative  . Not on file    Family History  Problem Relation Age of Onset  . Rheum arthritis Mother   . Hypothyroidism Mother   . Hypertension Mother   . Thyroid disease Mother   . Diabetes Father   . Prostate cancer Father 70  . Breast cancer Maternal Grandmother 80     Review of Systems  Constitutional: Negative.   HENT: Negative.   Eyes: Negative.   Respiratory: Negative.   Cardiovascular: Negative.   Gastrointestinal: Negative.   Genitourinary: Negative.    Musculoskeletal: Negative.   Skin: Negative.   Neurological: Negative.   Endo/Heme/Allergies: Negative.   Psychiatric/Behavioral: Negative.      Physical Exam: BP (!) 143/89   Pulse 90   Temp 98.2 F (36.8 C) (Oral)   Resp 17   Ht _0  (1.702 m)   Wt 98.9 kg   LMP 01/23/2019 (Exact Date)   BMI 34.14 kg/m   Constitutional: Well nourished, well developed female in no acute distress.  HEENT: normal Skin: Warm and dry.  Cardiovascular: Regular rate and rhythm.   Extremity: no edema Respiratory: Clear to auscultation bilateral. Normal respiratory effort Abdomen: FHT present Neuro: DTRs 2+, Cranial nerves grossly intact Psych: Alert and Oriented x3. No memory deficits. Normal mood and affect.  MS: normal gait, normal bilateral lower extremity ROM/strength/stability.  Patient Vitals for the past 24 hrs:  BP Temp Temp src Pulse Resp Height Weight  09/04/19 1806 (!) 143/89 - - 90 - - -  09/04/19 1757 (!) 148/80 - - 92 - - -  09/04/19 1751 (!) 146/86 - - 99 - - -  09/04/19 1723 (!) 144/97 98.2 F (36.8 C) Oral 85 17 _1  (1.702 m) 98.9 kg   Toco: negative for contractions Fetal well being: 130 bpm baseline, moderate variability, +accelerations, -decelerations  Consults: None  Significant Findings/ Diagnostic Studies: none  Procedures: NST  Hospital Course: The patient was admitted to Labor and Delivery Triage for observation.   Discharge Condition: good  Disposition: Discharge disposition: 01-Home or Self Care      Diet: Regular diet  Discharge Activity: Activity as tolerated  Preeclampsia precautions given  Discharge Instructions    Discharge activity:  No Restrictions   Complete by: As directed    Discharge diet:  No restrictions   Complete by: As directed    Fetal Kick Count:  Lie on our left side for one hour after a meal, and count the number of times your baby kicks.  If it is less than 5 times, get up, move around and drink some juice.  Repeat the  test 30 minutes later.  If it is still less than 5 kicks in an hour, notify your doctor.   Complete by: As directed    No sexual activity restrictions   Complete by: As directed    Notify physician for a general feeling that "something is not right"   Complete by: As directed    Notify physician for increase or change in vaginal discharge   Complete by: As directed    Notify physician for intestinal cramps, with or without diarrhea, sometimes described as "gas pain"   Complete by: As directed    Notify physician for leaking of fluid  Complete by: As directed    Notify physician for low, dull backache, unrelieved by heat or Tylenol   Complete by: As directed    Notify physician for menstrual like cramps   Complete by: As directed    Notify physician for pelvic pressure   Complete by: As directed    Notify physician for uterine contractions.  These may be painless and feel like the uterus is tightening or the baby is  "balling up"   Complete by: As directed    Notify physician for vaginal bleeding   Complete by: As directed    PRETERM LABOR:  Includes any of the follwing symptoms that occur between 20 - [redacted] weeks gestation.  If these symptoms are not stopped, preterm labor can result in preterm delivery, placing your baby at risk   Complete by: As directed      Allergies as of 09/04/2019      Reactions   Other Rash   ADHESIVE FROM BANDAIDS-"BURNS SKIN"      Medication List    TAKE these medications   Blood Pressure Kit 1 each by Does not apply route 2 (two) times daily.   ferrous sulfate 325 (65 FE) MG tablet Take 325 mg by mouth daily with breakfast.   multivitamin-prenatal 27-0.8 MG Tabs tablet Take 1 tablet by mouth daily at 12 noon.   vitamin C 100 MG tablet Take 150 mg by mouth daily.      [place]. Go to.   Specialty: Obstetrics and Gynecology Why: scheduled prenatal appointment Contact information: [address] [place] [address] [phone]          Total time spent taking care of this patient: 15 minutes  Signed: Rod Can, CNM  09/04/2019, 6:36 PM

## 2019-09-04 NOTE — OB Triage Note (Signed)
Pt presents to L&D for second dose of betamethasone. Per order of Dr Georgianne Fick, pt will be monitored. First Blood pressure  is 144/97. Pt confirms positive fetal movement and denies LOF.

## 2019-09-04 NOTE — Telephone Encounter (Signed)
-----   Message from Toni Fellers, MD sent at 09/03/2019  9:29 PM EDT ----- Please call ans schedule this patient for an office visit on 09/08/2019 ROB with NST with MD Thank you,  Dr. Gilman Schmidt

## 2019-09-04 NOTE — Telephone Encounter (Signed)
Patient is schedule with SDJ

## 2019-09-07 ENCOUNTER — Other Ambulatory Visit: Payer: Self-pay

## 2019-09-07 ENCOUNTER — Ambulatory Visit
Admission: RE | Admit: 2019-09-07 | Discharge: 2019-09-07 | Disposition: A | Discharge status: Home / Self Care | Payer: 59 | Source: Ambulatory Visit | Attending: Obstetrics and Gynecology | Admitting: Obstetrics and Gynecology

## 2019-09-07 VITALS — BP 136/81 | HR 80 | Temp 97.3°F | Resp 18 | Ht 67.0 in | Wt 219.5 lb

## 2019-09-07 DIAGNOSIS — O365930 Maternal care for other known or suspected poor fetal growth, third trimester, not applicable or unspecified: Secondary | ICD-10-CM

## 2019-09-07 DIAGNOSIS — O09519 Supervision of elderly primigravida, unspecified trimester: Secondary | ICD-10-CM

## 2019-09-07 DIAGNOSIS — D259 Leiomyoma of uterus, unspecified: Secondary | ICD-10-CM

## 2019-09-07 DIAGNOSIS — O1493 Unspecified pre-eclampsia, third trimester: Secondary | ICD-10-CM

## 2019-09-07 NOTE — Progress Notes (Signed)
[facility] note-  38 year old gravida 3 para 0-0-2-0 married African-American female who works at [employer] 9 now on leave)  who is currently 32 weeks and 3 days I called Dr. Georgianne Fick and discussed Toni Byrd's care. Her initial blood pressure today was 141/87, it came down to 136/81 on repeat. She was admitted last Thursday and received steroids and a preeclampsia evaluation.  Her blood pressures were elevated in the 140/90 range. She has been resting at home currently, she reports some pelvic heaviness but otherwise feels well. Today the fetus weighs 1347 g to less than the 3rd percentile.  There is been a approximately 20 g/day interval growth.  Dopplers are elevated but continue to show forward flow in all tracings. AFI is 6.6 there is a 2 x 2 pocket.  There is a 8 out of 8 BPP. The patient sees [facility] tomorrow.  I schedule a follow-up with Dr. Diamantina Monks on Thursday. Given her likely diagnosis of either gestational hypertension or preeclampsia and fetal growth restriction with low fluid and elevated Dopplers aiming for a 34-week delivery seems reasonable unless her status or the fetal status changes.    Toni Mayer, MD

## 2019-09-08 ENCOUNTER — Inpatient Hospital Stay
Admission: EM | Admit: 2019-09-08 | Discharge: 2019-09-12 | DRG: 787 | Disposition: A | Discharge status: Home / Self Care | Payer: 59 | Attending: Certified Nurse Midwife | Admitting: Certified Nurse Midwife

## 2019-09-08 ENCOUNTER — Inpatient Hospital Stay: Payer: 59

## 2019-09-08 ENCOUNTER — Ambulatory Visit (INDEPENDENT_AMBULATORY_CARE_PROVIDER_SITE_OTHER): Payer: 59 | Admitting: Obstetrics and Gynecology

## 2019-09-08 ENCOUNTER — Other Ambulatory Visit: Payer: Self-pay

## 2019-09-08 VITALS — BP 142/80 | Wt 213.0 lb

## 2019-09-08 DIAGNOSIS — O1493 Unspecified pre-eclampsia, third trimester: Secondary | ICD-10-CM

## 2019-09-08 DIAGNOSIS — D259 Leiomyoma of uterus, unspecified: Secondary | ICD-10-CM

## 2019-09-08 DIAGNOSIS — O0993 Supervision of high risk pregnancy, unspecified, third trimester: Secondary | ICD-10-CM | POA: Diagnosis not present

## 2019-09-08 DIAGNOSIS — O365930 Maternal care for other known or suspected poor fetal growth, third trimester, not applicable or unspecified: Secondary | ICD-10-CM | POA: Diagnosis present

## 2019-09-08 DIAGNOSIS — Z20828 Contact with and (suspected) exposure to other viral communicable diseases: Secondary | ICD-10-CM | POA: Diagnosis present

## 2019-09-08 DIAGNOSIS — R05 Cough: Secondary | ICD-10-CM | POA: Diagnosis not present

## 2019-09-08 DIAGNOSIS — Z3A32 32 weeks gestation of pregnancy: Secondary | ICD-10-CM

## 2019-09-08 DIAGNOSIS — O3413 Maternal care for benign tumor of corpus uteri, third trimester: Secondary | ICD-10-CM | POA: Diagnosis present

## 2019-09-08 DIAGNOSIS — O09519 Supervision of elderly primigravida, unspecified trimester: Secondary | ICD-10-CM

## 2019-09-08 DIAGNOSIS — O9089 Other complications of the puerperium, not elsewhere classified: Secondary | ICD-10-CM | POA: Diagnosis not present

## 2019-09-08 DIAGNOSIS — O9081 Anemia of the puerperium: Secondary | ICD-10-CM | POA: Diagnosis not present

## 2019-09-08 DIAGNOSIS — O0990 Supervision of high risk pregnancy, unspecified, unspecified trimester: Secondary | ICD-10-CM

## 2019-09-08 DIAGNOSIS — O09513 Supervision of elderly primigravida, third trimester: Secondary | ICD-10-CM

## 2019-09-08 DIAGNOSIS — O1414 Severe pre-eclampsia complicating childbirth: Secondary | ICD-10-CM | POA: Diagnosis present

## 2019-09-08 DIAGNOSIS — D62 Acute posthemorrhagic anemia: Secondary | ICD-10-CM | POA: Diagnosis not present

## 2019-09-08 DIAGNOSIS — O288 Other abnormal findings on antenatal screening of mother: Secondary | ICD-10-CM

## 2019-09-08 LAB — COMPREHENSIVE METABOLIC PANEL
ALT: 26 U/L (ref 0–44)
AST: 32 U/L (ref 15–41)
Albumin: 2.6 g/dL — ABNORMAL LOW (ref 3.5–5.0)
Alkaline Phosphatase: 169 U/L — ABNORMAL HIGH (ref 38–126)
Anion gap: 9 (ref 5–15)
BUN: 12 mg/dL (ref 6–20)
CO2: 23 mmol/L (ref 22–32)
Calcium: 8.4 mg/dL — ABNORMAL LOW (ref 8.9–10.3)
Chloride: 103 mmol/L (ref 98–111)
Creatinine, Ser: 0.49 mg/dL (ref 0.44–1.00)
GFR calc Af Amer: >60 mL/min (ref >60)
GFR calc non Af Amer: >60 mL/min (ref >60)
Glucose, Bld: 97 mg/dL (ref 70–99)
Potassium: 3.3 mmol/L — ABNORMAL LOW (ref 3.5–5.1)
Sodium: 135 mmol/L (ref 135–145)
Total Bilirubin: 0.5 mg/dL (ref 0.3–1.2)
Total Protein: 5.7 g/dL — ABNORMAL LOW (ref 6.5–8.1)

## 2019-09-08 LAB — CBC
HCT: 39.1 % (ref 36.0–46.0)
Hemoglobin: 12.9 g/dL (ref 12.0–15.0)
MCH: 27.9 pg (ref 26.0–34.0)
MCHC: 33 g/dL (ref 30.0–36.0)
MCV: 84.4 fL (ref 80.0–100.0)
Platelets: 186 10*3/uL (ref 150–400)
RBC: 4.63 MIL/uL (ref 3.87–5.11)
RDW: 13.9 % (ref 11.5–15.5)
WBC: 15.5 10*3/uL — ABNORMAL HIGH (ref 4.0–10.5)
nRBC: 1.7 % — ABNORMAL HIGH (ref 0.0–0.2)

## 2019-09-08 LAB — GROUP B STREP BY PCR: Group B strep by PCR: NEGATIVE

## 2019-09-08 LAB — PROTEIN / CREATININE RATIO, URINE
Creatinine, Urine: 63 mg/dL
Protein Creatinine Ratio: 3.4 mg/mg{Cre} — ABNORMAL HIGH (ref 0.00–0.15)
Total Protein, Urine: 214 mg/dL

## 2019-09-08 LAB — SARS CORONAVIRUS 2 BY RT PCR (HOSPITAL ORDER, PERFORMED IN ~~LOC~~ HOSPITAL LAB): SARS Coronavirus 2: NEGATIVE

## 2019-09-08 MED ORDER — SODIUM CHLORIDE 0.9% IV SOLUTION: Freq: Once | INTRAVENOUS | Status: DC

## 2019-09-08 MED ORDER — MAGNESIUM SULFATE 40 GM/1000ML IV SOLN
1.0000 g/h | INTRAVENOUS | Status: DC
  Administered 2019-09-08: 1 g/h via INTRAVENOUS

## 2019-09-08 MED ORDER — SOD CITRATE-CITRIC ACID 500-334 MG/5ML PO SOLN
30.0000 mL | ORAL | Status: AC
  Administered 2019-09-09: 30 mL via ORAL

## 2019-09-08 MED ORDER — LABETALOL HCL 5 MG/ML IV SOLN
20.0000 mg | INTRAVENOUS | Status: DC | PRN
  Administered 2019-09-08: 20 mg via INTRAVENOUS
  Filled 2019-09-08: qty 4

## 2019-09-08 MED ORDER — LABETALOL HCL 5 MG/ML IV SOLN
20.0000 mg | INTRAVENOUS | Status: DC | PRN
  Administered 2019-09-09: 20 mg via INTRAVENOUS
  Filled 2019-09-08 (×2): qty 4

## 2019-09-08 MED ORDER — SOD CITRATE-CITRIC ACID 500-334 MG/5ML PO SOLN
30.0000 mL | ORAL | Status: DC | PRN
  Filled 2019-09-08: qty 30

## 2019-09-08 MED ORDER — LABETALOL HCL 5 MG/ML IV SOLN
40.0000 mg | INTRAVENOUS | Status: DC | PRN
  Filled 2019-09-08: qty 8

## 2019-09-08 MED ORDER — CEFAZOLIN SODIUM-DEXTROSE 2-4 GM/100ML-% IV SOLN
2.0000 g | INTRAVENOUS | Status: AC
  Administered 2019-09-09: 2 g via INTRAVENOUS
  Filled 2019-09-08 (×2): qty 100

## 2019-09-08 MED ORDER — HYDRALAZINE HCL 20 MG/ML IJ SOLN: 10.0000 mg | INTRAMUSCULAR | Status: DC | PRN

## 2019-09-08 MED ORDER — BUPIVACAINE 0.25 % ON-Q PUMP DUAL CATH 400 ML
400.0000 mL | INJECTION | Status: DC
  Filled 2019-09-08: qty 400

## 2019-09-08 MED ORDER — LACTATED RINGERS IV SOLN
INTRAVENOUS | Status: DC
  Administered 2019-09-08 – 2019-09-09 (×2): via INTRAVENOUS

## 2019-09-08 MED ORDER — ONDANSETRON HCL 4 MG/2ML IJ SOLN: 4.0000 mg | Freq: Four times a day (QID) | INTRAMUSCULAR | Status: DC | PRN

## 2019-09-08 MED ORDER — LABETALOL HCL 5 MG/ML IV SOLN: 40.0000 mg | INTRAVENOUS | Status: DC | PRN

## 2019-09-08 MED ORDER — MAGNESIUM SULFATE BOLUS VIA INFUSION
4.0000 g | Freq: Once | INTRAVENOUS | Status: AC
  Administered 2019-09-08: 4 g via INTRAVENOUS
  Filled 2019-09-08: qty 1000

## 2019-09-08 MED ORDER — LIDOCAINE HCL (PF) 1 % IJ SOLN: 30.0000 mL | INTRAMUSCULAR | Status: DC | PRN

## 2019-09-08 MED ORDER — LABETALOL HCL 5 MG/ML IV SOLN
80.0000 mg | INTRAVENOUS | Status: DC | PRN
  Filled 2019-09-08: qty 16

## 2019-09-08 MED ORDER — LABETALOL HCL 5 MG/ML IV SOLN: 80.0000 mg | INTRAVENOUS | Status: DC | PRN

## 2019-09-08 MED ORDER — MAGNESIUM SULFATE 40 GM/1000ML IV SOLN
INTRAVENOUS | Status: AC
  Filled 2019-09-08: qty 1000

## 2019-09-08 MED ORDER — DEXTROSE IN LACTATED RINGERS 5 % IV SOLN
INTRAVENOUS | Status: DC
  Administered 2019-09-08: 18:00:00 via INTRAVENOUS

## 2019-09-08 NOTE — Progress Notes (Signed)
[place] requested Advanced Directive education. AD education offered with Toni Byrd and her mother. Toni Byrd was able to articulate some of her wishes during the visit. An AD was left for her to continue exploring. Chaplain will refer to on-call chaplain on 10.28.2020 to offer additional support. Toni Byrd also articulated and understanding she can request AD support at any time. Chaplain offered prayer for [name] and her undelivered baby ([name]) that he might await a few more weeks before arriving.     09/08/19 1800  Clinical Encounter Type  Visited With Patient and family together  Visit Type Initial  Referral From Patient

## 2019-09-08 NOTE — OB Triage Note (Signed)
Pt sent from OBGYN office for monitoring. Pt is a G3P0020 [redacted]w[redacted]d. Pt denies LOF, vaginal bleeding, or ctx. Pt states positive fetal movement. External monitors applied and assessing. Initial FHR  150. Initial BP 159/86, repeat BP 143/90. BP cycling. Cari Caraway, MD made aware. Verbal order for CBC, CMP, RPR, type and screen, P/C urine, covid, and insert IV. Will continue to assess pt.

## 2019-09-08 NOTE — Progress Notes (Signed)
Pt returned from [place] and bacl in Obs 4, external monitors applied

## 2019-09-08 NOTE — Progress Notes (Signed)
Discussed patient's care with Dr. Solon Augusta at [facility] MFM. She recommended that the patient be on continuous monitoring and have her preeclampsia labs repeated. She recommended a low threshold for delivery including minimal variability, neurological symptoms of preeclampsia, fetal heart rate decelerations or lab abnormalities. Given her severe range BP she recommended a 24 hour course of magnesium which could be discontinued if her BP stabilized.   Adrian Prows MD [facility] 09/08/2019 9:52 PM

## 2019-09-08 NOTE — Progress Notes (Signed)
Discussed plan of care with patient and her mother. She has had continued minimal variability despite IV fluids and a meal. This is an acute change from last week when her fetal heart tracing was reactive. Her BPs were elevated and IV magnesium was started. She has received IV labetalol. Her blood pressures are currently in the moderate range. Given these changes will plan for delivery. Discussed with patient her options for contraction stress test and induction of labor versus elective cesarean delivery. Patient desires an elective primary cesarean delivery. Will plan for this surgery at 5am on 09/09/2019.  Adrian Prows MD [facility] 09/08/2019 11:15 PM

## 2019-09-08 NOTE — Progress Notes (Signed)
Pt off the floor to Ultrasound for fetal BPP WO Non stress.

## 2019-09-08 NOTE — H&P (Signed)
H&P  Toni Byrd is an 38 y.o. female.  HPI: Toni Byrd was sent today from the office after a nonreactive NST in the office. Her pregnancy is complicated by IUGR less than 3rd precentile, a fibroid uterus, preeclampsia with moderate range blood pressures. She is feeling well with no complaints. She feels fetal movement. She denies vaginal bleeding. She denies headache, vision changes and RUQ pain.  THIRD Problems (from 03/03/19 to present)    Problem Noted Resolved   Preeclampsia, third trimester 09/04/2019 by Rod Can, CNM No   Poor fetal growth affecting management of mother in third trimester 08/17/2019 by Wynona Neat, MD No   Overview Addendum 09/07/2019  3:46 PM by Gatha Mayer, MD    EFW 10/26 1347  gm <3rd , AFI 6.6  EFW  On 10/5 at [redacted]w[redacted]d <3rd% with AC <3rd%; elevated cord Dopplers, BPP 8/8 Recommend 2x weekly antenatal testing with weekly AFI/cord Dopplers and growth in 3 weeks. If umbilical artery Dopplers demonstrate absent or REDF, recommend inpatient monitoring and administration of steroids.      Uterine fibroids affecting pregnancy 04/09/2019 by Gae Dry, MD No   Overview Signed 04/09/2019  2:17 PM by Gae Dry, MD       Risks of PTL, pain, bleeding discussed    Location of fibroid fundal, unlikely obstructive for labor    Prior myomectomy, but OK for vaginal delivery per pt        Supervision of high risk pregnancy, antepartum 03/03/2019 by Dalia Heading, CNM No   Overview Addendum 07/06/2019  9:00 AM by Gae Dry, MD    Clinic [facility] Prenatal Labs  Dating L=8 Blood type: O/Positive/-- (04/21 1439)   Genetic Screen NIPS:nml XY Antibody:Negative (04/21 1439)  Anatomic [place] [facility] Rubella: 8.61 (04/21 1439) Varicella: Im  GTT Early:high;  3 hr GTT:         Third trimester:  RPR: Non Reactive (04/21 1439)   Rhogam O+ HBsAg: Negative (04/21 1439)   TDaP vaccine     Flu Shot: 07/06/19 HIV: Non Reactive (04/21 1439)   Baby Food  Breast at first GBS:   Contraception None, planning for pregnancy soon after delivery Pap: 11/20/2016 NLIM/HPV Neg  CBB  No   CS/VBAC Vaginal labor ok (prior myomectomy)   Support Person Husband                   Past Medical History:  Diagnosis Date  . Bacterial vaginitis   . Dysmenorrhea   . Fibroid   . Hydrosalpinx   . Spontaneous abortion   . Trichomoniasis     Past Surgical History:  Procedure Laterality Date  . DILATATION & CURETTAGE/HYSTEROSCOPY WITH MYOSURE N/A 01/23/2018   Procedure: DILATATION & CURETTAGE/HYSTEROSCOPY WITH MYOSURE,REMOVAL OF UTERINE LESION;  Surgeon: Will Bonnet, MD;  Location: [facility] ORS;  Service: Gynecology;  Laterality: N/A;  . LASIK      Family History  Problem Relation Age of Onset  . Rheum arthritis Mother   . Hypothyroidism Mother   . Hypertension Mother   . Thyroid disease Mother   . Diabetes Father   . Prostate cancer Father 3  . Breast cancer Maternal Grandmother 80    Social History:  reports that she has never smoked. She has never used smokeless tobacco. She reports previous alcohol use. She reports that she does not use drugs.  Allergies:  Allergies  Allergen Reactions  . Other Rash    ADHESIVE FROM BANDAIDS-"BURNS SKIN"  Medications: I have reviewed the patient's current medications.  Results for orders placed or performed during the hospital encounter of 09/08/19 (from the past 48 hour(s))  CBC     Status: Abnormal   Collection Time: 09/08/19  4:46 PM  Result Value Ref Range   WBC 15.5 (H) 4.0 - 10.5 K/uL   RBC 4.63 3.87 - 5.11 MIL/uL   Hemoglobin 12.9 12.0 - 15.0 g/dL   HCT 39.1 36.0 - 46.0 %   MCV 84.4 80.0 - 100.0 fL   MCH 27.9 26.0 - 34.0 pg   MCHC 33.0 30.0 - 36.0 g/dL   RDW 13.9 11.5 - 15.5 %   Platelets 186 150 - 400 K/uL   nRBC 1.7 (H) 0.0 - 0.2 %    Comment: Performed at [facility], [address] [place], [place] [address]  Type and screen [place]      Status: None (Preliminary result)   Collection Time: 09/08/19  4:46 PM  Result Value Ref Range   ABO/RH(D) PENDING    Antibody Screen PENDING    Sample Expiration      09/11/2019,2359 Performed at [facility], [address] [place], [place] [address]   Comprehensive metabolic panel     Status: Abnormal   Collection Time: 09/08/19  4:46 PM  Result Value Ref Range   Sodium 135 135 - 145 mmol/L   Potassium 3.3 (L) 3.5 - 5.1 mmol/L   Chloride 103 98 - 111 mmol/L   CO2 23 22 - 32 mmol/L   Glucose, Bld 97 70 - 99 mg/dL   BUN 12 6 - 20 mg/dL   Creatinine, Ser 0.49 0.44 - 1.00 mg/dL   Calcium 8.4 (L) 8.9 - 10.3 mg/dL   Total Protein 5.7 (L) 6.5 - 8.1 g/dL   Albumin 2.6 (L) 3.5 - 5.0 g/dL   AST 32 15 - 41 U/L   ALT 26 0 - 44 U/L   Alkaline Phosphatase 169 (H) 38 - 126 U/L   Total Bilirubin 0.5 0.3 - 1.2 mg/dL   GFR calc non Af Amer >60 >60 mL/min   GFR calc Af Amer >60 >60 mL/min   Anion gap 9 5 - 15    Comment: Performed at [facility], [address] [place], [place] [address]    US Fetal Bpp Wo Non Stress  Result Date: 09/08/2019 CLINICAL DATA:  Nonreactive NST. EXAM: BIOPHYSICAL PROFILE FINDINGS: Number of Fetuses: 1 Heart Rate: 150 bpm Presentation: Cephalic Movement: 2 time: 14 minutes Breathing: 2 Tone: 2 Amniotic Fluid: 2 Total Score: 8 IMPRESSION: 1. Single living intrauterine gestation in cephalic lie. Normal fetal cardiac activity. 2. Normal biophysical profile score 8/8. Electronically Signed   By: Ilona Sorrel M.D.   On: 09/08/2019 18:08   [place] Mfm Ob Follow Up  Result Date: 09/07/2019 ----------------------------------------------------------------------  OBSTETRICS REPORT                       (Signed Final 09/07/2019 03:57 pm) ---------------------------------------------------------------------- PATIENT INFO:  ID #:       [id number]                          D.O.B.:  Mar 16, 1981 (38 yrs)  Name:       Toni Byrd              Visit Date:  09/07/2019 02:52 pm ---------------------------------------------------------------------- PERFORMED BY:  Performed By:  Ashleigh Desena        Ref. Address:     [address], [place],                                                             [place] [address]  Referred By:      Will Bonnet MD ---------------------------------------------------------------------- SERVICE(S) PROVIDED:   [place] MFM OB FOLLOW UP                                  [phone]  ---------------------------------------------------------------------- INDICATIONS:   [redacted] weeks gestation of pregnancy                Z3A.32  ---------------------------------------------------------------------- FETAL EVALUATION:  Num Of Fetuses:         1  Fetal Heart Rate(bpm):  150  Presentation:           Cephalic  Placenta:               Anterior  AFI Sum(cm)     %Tile       Largest Pocket(cm)  6.62            < 3         2  RUQ(cm)       RLQ(cm)       LUQ(cm)        LLQ(cm)  2             1.57          1.2            1.85 ---------------------------------------------------------------------- BIOMETRY:  BPD:      77.6  mm     G. Age:  31w 1d         11  %    CI:        78.97   %    70 - 86                                                          FL/HC:      20.0   %    19.1 - 21.3  HC:      276.1  mm     G. Age:  30w 1d        < 3  %    HC/AC:      1.13        0.96 - 1.17  AC:      245.3  mm     G. Age:  28w 5d        < 3  %    FL/BPD:     71.0   %    71 - 87  FL:       55.1  mm     G. Age:  29w 0d        < 3  %    FL/AC:      22.5   %    20 - 24  HUM:      50.9  mm     G. Age:  29w 6d        < 5  %  Est. FW:    1347  gm           3 lb    < 3  % ---------------------------------------------------------------------- GESTATIONAL AGE:  LMP:           32w 3d        Date:  01/23/19                 EDD:   10/30/19  U/S Today:     29w 5d                                         EDD:   11/18/19  Best:          32w 3d     Det. By:  LMP  (01/23/19)          EDD:   10/30/19 ---------------------------------------------------------------------- ANATOMY:  Cavum:                 Visualized             Stomach:                Seen                         previously  Ventricles:            Visualized             Abdominal Wall:         Visualized                         previously                                     previously  Cerebellum:            Visualized             Cord Vessels:           3 vessels,                         previously                                     visualized previously  Posterior Fossa:       Visualized             Kidneys:                Normal appearance  previously  Face:                  Orbits visualized      Bladder:                Seen                         previously  Lips:                  Visualized             Spine:                  Visualized                         previously                                     previously  Heart:                 4-Chamber view         Upper Extremities:      Visualized                         appears normal                                 previously  RVOT:                  Normal appearance      Lower Extremities:      Visualized                                                                        previously  LVOT:                  Normal appearance ---------------------------------------------------------------------- DOPPLER - FETAL VESSELS:  Umbilical Artery   S/D     %tile   3.6       91 ---------------------------------------------------------------------- MYOMAS:   Site                     L(cm)      W(cm)      D(cm)      Location   Anterior mid             5.1        4.6        4.7   Anterior mid 2           3.5        3.4        3.3  ----------------------------------------------------------------------   Blood Flow                 RI        PI       Comments   ---------------------------------------------------------------------- IMPRESSION:  Dear Adele Schilder  ,  Thank you for referring your patient  for a fetal growth  evaluation.The  patient has been monitored for FGR . She  was admitted last Thursday for intermittent absent diastolic  flow on doppler evaluation and for r/o preeclampsia . She  received BTM x 2 and had normal preeclampsia labs . She  has been out of work and is resting more . She c/o sense of  heaviness in her pelvis with walking but otherwise feels well.  Her mother accompanies her and reports having small 5 lb  healthy babies at term  Today she is at 67 w 3d by LMP and earliest available  ultrasound performed at [facility] at 69 w 0 d on 03/18/2019.  There is a singleton gestation with normal amniotic fluid  volume.  The estimated fetal weight is at the  < 3 rd percentile   The est weight is 1347 g , from 908 g - so a little over 20  gms of growth /day.  The fluid appears low normal today at 6.6 , there is a 2 x2 cm  pocket .  This finding  is consistent with the diagnosis of intrauterine  growth restriction (IUGR)t( estimated fetal weight by  ultrasound and actual birthweight can differ up to 15 percent.)  The BPP was noted to be 8/8 which was reassuring.  The umbilical artery Doppler studies were within normal limits  (S/D = 3.6) ( 91st percentile )  The previously noted fibroids were again seen  BP at arrival was 141/75 recheck 136/81.  Pt seeing [facility] tomorrow , I requested she see Dr Diamantina Monks  on Thursday for BPP and BP check.  Twice Weekly testing NST with weekly BPP and Doppler  studies is suggested.  Serial growth scans are suggested (approximately every 3  weeks).  Thank you for allowing [place] to participate in your patient's care.  Please do not hesitate to contact us if we can be of further  assistance. ----------------------------------------------------------------------              Gatha Mayer, MD Electronically Signed Final Report    09/07/2019 03:57 pm ----------------------------------------------------------------------   Review of Systems  Constitutional: Negative for chills, fever, malaise/fatigue and weight loss.  HENT: Negative for congestion, hearing loss and sinus pain.   Eyes: Negative for blurred vision and double vision.  Respiratory: Negative for cough, sputum production, shortness of breath and wheezing.   Cardiovascular: Negative for chest pain, palpitations, orthopnea and leg swelling.  Gastrointestinal: Negative for abdominal pain, constipation, diarrhea, nausea and vomiting.  Genitourinary: Negative for dysuria, flank pain, frequency, hematuria and urgency.  Musculoskeletal: Negative for back pain, falls and joint pain.  Skin: Negative for itching and rash.  Neurological: Negative for dizziness and headaches.  Psychiatric/Behavioral: Negative for depression, substance abuse and suicidal ideas. The patient is not nervous/anxious.    Blood pressure (!) 176/94, pulse 77, temperature 98.2 F (36.8 C), temperature source Oral, resp. rate 18, height 5\' 7"  (1.702 m), weight 96.6 kg, last menstrual period 01/23/2019, unknown if currently breastfeeding. Physical Exam  Nursing note and vitals reviewed. Constitutional: She is oriented to person, place, and time. She appears well-developed and well-nourished.  HENT:  Head: Normocephalic and atraumatic.  Cardiovascular: Normal rate and regular rhythm.  Respiratory: Effort normal and breath sounds normal.  GI: Soft. Bowel sounds are normal.  Musculoskeletal: Normal range of motion.  Neurological: She is alert and oriented to person, place, and time.  Skin: Skin is warm and dry.  Psychiatric: She has a normal mood and affect. Her behavior is normal. Judgment and thought content normal.  NST: 145 bpm baseline, moderate variability, no accelerations, no decelerations. Tocometer : no contractions  Assessment/Plan: 38 yo G3P0020 [redacted]w[redacted]d with intrauterine growth  restriction and preeclampsia.   1) Nonreactive NST:  BPP 8/10 today 2) Severe IUGR less than the 3rd percentile.  Continued weekly AFI and UA dopplers.  3) Pre-eclampsia - moderate range BPs, continue BP monitoring, labs pending. Continue with daily labs to monitor for any acute changes. 4) Will admit for continued close surveillance and monitoring of complicated obstetrical care- MFM co-managing patient. 5) Consult to neonatology. NICU has confirmed they are comfortable supporting the infant should it have to be delivered this week and are aware of the EFW.  6) Regular diet 7) Has completed betamethasone course on 10/22 and 10/23   R  09/08/2019, 6:19 PM

## 2019-09-08 NOTE — Progress Notes (Signed)
Routine Prenatal Care Visit  Subjective  Toni Byrd is a 38 y.o. [id number] at [redacted]w[redacted]d being seen today for ongoing prenatal care.  She is currently monitored for the following issues for this high-risk pregnancy and has History of recurrent miscarriages; Fibroid uterus; Advanced maternal age, primigravida, antepartum; Supervision of high risk pregnancy, antepartum; Uterine fibroids affecting pregnancy; Poor fetal growth affecting management of mother in third trimester; IUGR (intrauterine growth restriction) affecting care of mother; Preeclampsia, third trimester; and Intrauterine growth restriction (IUGR) affecting care of mother, third trimester, single gestation on their problem list.  ----------------------------------------------------------------------------------- Patient reports no complaints.  Denies HA, visual changes, and RUQ pain Contractions: Not present. Vag. Bleeding: None.  Movement: Present. Leaking Fluid denies.  Admitted last week and diagnosed with preeclampsia without severe features (based on BP and proteinuria).  She also had intermittent absent end diastolic flow on umbilical artery dopplers last week.  Yesterday, she had an interval growth scan where overall growth was still less than the 3rd percentile.  Umbilical artery Dopplers were upper limit of normal without evidence of absent end diastolic flow.  Her initial blood pressure was elevated.  However, her repeat blood pressure was in the upper limits of normal.  She denied symptoms at that time.  She had a BPP that was 8 out of 8 yesterday.  I spoke with Dr. Lehman Prom who recommended that if the patient had elevated blood pressures again at today's visit, the patient should be admitted for inpatient monitoring until time of delivery. ----------------------------------------------------------------------------------- The following portions of the patient's history were reviewed and updated as appropriate: allergies,  current medications, past family history, past medical history, past social history, past surgical history and problem list. Problem list updated.  Objective  Blood pressure (!) 142/80, weight 213 lb (96.6 kg), last menstrual period 01/23/2019, unknown if currently breastfeeding. Pregravid weight 190 lb (86.2 kg) Total Weight Gain 23 lb (10.4 kg) Urinalysis: Urine Protein    Urine Glucose    Fetal Status: Fetal Heart Rate (bpm): 150   Movement: Present     General:  Alert, oriented and cooperative. Patient is in no acute distress.  Skin: Skin is warm and dry. No rash noted.   Cardiovascular: Normal heart rate noted  Respiratory: Normal respiratory effort, no problems with respiration noted  Abdomen: Soft, gravid, appropriate for gestational age. Pain/Pressure: Absent     Pelvic:  Cervical exam deferred        Extremities: Normal range of motion.  Edema: None  Mental Status: Normal mood and affect. Normal behavior. Normal judgment and thought content.   NST: Baseline FHR: 150 beats/min Variability: minimal  Accelerations: absent Decelerations: absent (there were a couple of times where a deceleration might have occurred. However, the tracing was broken) Tocometry: not done  Interpretation:  INDICATIONS: severe fetal growth restriction, preeclampsia without severe features RESULTS:  A NST procedure was performed with FHR monitoring and a normal baseline established, appropriate time of 20-40 minutes of evaluation, and accels >2 seen w 15x15 characteristics.  Results show a REACTIVE NST.   Assessment   38 y.o. [id number] at [redacted]w[redacted]d by  10/30/2019, by Last Menstrual Period presenting for routine prenatal visit  Plan   THIRD Problems (from 03/03/19 to present)    Problem Noted Resolved   Preeclampsia, third trimester 09/04/2019 by Rod Can, CNM No   Poor fetal growth affecting management of mother in third trimester 08/17/2019 by Wynona Neat, MD No   Overview Addendum 09/07/2019   3:46 PM by  Gatha Mayer, MD    EFW 10/26 1347  gm <3rd , AFI 6.6  EFW  On 10/5 at [redacted]w[redacted]d <3rd% with AC <3rd%; elevated cord Dopplers, BPP 8/8 Recommend 2x weekly antenatal testing with weekly AFI/cord Dopplers and growth in 3 weeks. If umbilical artery Dopplers demonstrate absent or REDF, recommend inpatient monitoring and administration of steroids.      Uterine fibroids affecting pregnancy 04/09/2019 by Gae Dry, MD No   Overview Signed 04/09/2019  2:17 PM by Gae Dry, MD       Risks of PTL, pain, bleeding discussed    Location of fibroid fundal, unlikely obstructive for labor    Prior myomectomy, but OK for vaginal delivery per pt        Supervision of high risk pregnancy, antepartum 03/03/2019 by Dalia Heading, CNM No   Overview Addendum 07/06/2019  9:00 AM by Gae Dry, MD    Clinic [facility] Prenatal Labs  Dating L=8 Blood type: O/Positive/-- (04/21 1439)   Genetic Screen NIPS:nml XY Antibody:Negative (04/21 1439)  Anatomic [place] [facility] Rubella: 8.61 (04/21 1439) Varicella: Im  GTT Early:high;  3 hr GTT:         Third trimester:  RPR: Non Reactive (04/21 1439)   Rhogam O+ HBsAg: Negative (04/21 1439)   TDaP vaccine     Flu Shot: 07/06/19 HIV: Non Reactive (04/21 1439)   Baby Food Breast at first GBS:   Contraception None, planning for pregnancy soon after delivery Pap: 11/20/2016 NLIM/HPV Neg  CBB  No   CS/VBAC Vaginal labor ok (prior myomectomy)   Support Person Husband                 Preterm labor symptoms and general obstetric precautions including but not limited to vaginal bleeding, contractions, leaking of fluid and fetal movement were reviewed in detail with the patient. Please refer to After Visit Summary for other counseling recommendations.   Return for To hospital for admission.   I discussed the overall clinical situation with the patient.  My strong recommendation was for her to go to the hospital for inpatient admission until  indicated time of delivery.  Also, I discussed that given her fetal tracing today, she would need further monitoring initially.  She denies any symptoms of preeclampsia today.  I also spoke with [facility] Sinclair [facility] at [facility] today who further concurred with the recommendation.  The patient is agreeable to the plan and all her questions were answered.  I spoke to the on-call provider who understand that the patient will be coming to the hospital.  Prentice Docker, MD, [place], [place] 09/09/2019 10:32 AM

## 2019-09-09 ENCOUNTER — Inpatient Hospital Stay: Payer: 59 | Admitting: Certified Registered Nurse Anesthetist

## 2019-09-09 ENCOUNTER — Encounter: Payer: Self-pay | Admitting: Obstetrics and Gynecology

## 2019-09-09 ENCOUNTER — Encounter
Admission: EM | Disposition: A | Discharge status: Home / Self Care | Payer: Self-pay | Source: Home / Self Care | Attending: Certified Nurse Midwife

## 2019-09-09 DIAGNOSIS — O3413 Maternal care for benign tumor of corpus uteri, third trimester: Secondary | ICD-10-CM

## 2019-09-09 DIAGNOSIS — D259 Leiomyoma of uterus, unspecified: Secondary | ICD-10-CM

## 2019-09-09 LAB — CBC WITH DIFFERENTIAL/PLATELET
Abs Immature Granulocytes: 0.26 10*3/uL — ABNORMAL HIGH (ref 0.00–0.07)
Basophils Absolute: 0 10*3/uL (ref 0.0–0.1)
Basophils Relative: 0 %
Eosinophils Absolute: 0 10*3/uL (ref 0.0–0.5)
Eosinophils Relative: 0 %
HCT: 37.1 % (ref 36.0–46.0)
Hemoglobin: 12.2 g/dL (ref 12.0–15.0)
Immature Granulocytes: 2 %
Lymphocytes Relative: 20 %
Lymphs Abs: 2.9 10*3/uL (ref 0.7–4.0)
MCH: 27.9 pg (ref 26.0–34.0)
MCHC: 32.9 g/dL (ref 30.0–36.0)
MCV: 84.7 fL (ref 80.0–100.0)
Monocytes Absolute: 0.9 10*3/uL (ref 0.1–1.0)
Monocytes Relative: 6 %
Neutro Abs: 10.8 10*3/uL — ABNORMAL HIGH (ref 1.7–7.7)
Neutrophils Relative %: 72 %
Platelets: 195 10*3/uL (ref 150–400)
RBC: 4.38 MIL/uL (ref 3.87–5.11)
RDW: 13.9 % (ref 11.5–15.5)
WBC: 15 10*3/uL — ABNORMAL HIGH (ref 4.0–10.5)
nRBC: 1.3 % — ABNORMAL HIGH (ref 0.0–0.2)

## 2019-09-09 LAB — RPR: RPR Ser Ql: NONREACTIVE

## 2019-09-09 SURGERY — Surgical Case
Anesthesia: Epidural

## 2019-09-09 MED ORDER — SIMETHICONE 80 MG PO CHEW
80.0000 mg | CHEWABLE_TABLET | ORAL | Status: DC
  Administered 2019-09-10: 80 mg via ORAL

## 2019-09-09 MED ORDER — OXYTOCIN 40 UNITS IN NORMAL SALINE INFUSION - SIMPLE MED
INTRAVENOUS | Status: AC
  Filled 2019-09-09: qty 1000

## 2019-09-09 MED ORDER — MORPHINE SULFATE (PF) 0.5 MG/ML IJ SOLN
INTRAMUSCULAR | Status: DC | PRN
  Administered 2019-09-09: .1 mg via INTRATHECAL

## 2019-09-09 MED ORDER — KETOROLAC TROMETHAMINE 30 MG/ML IJ SOLN
30.0000 mg | Freq: Four times a day (QID) | INTRAMUSCULAR | Status: DC | PRN
  Administered 2019-09-09 (×2): 30 mg via INTRAVENOUS
  Filled 2019-09-09 (×2): qty 1

## 2019-09-09 MED ORDER — MENTHOL 3 MG MT LOZG
1.0000 | LOZENGE | OROMUCOSAL | Status: DC | PRN
  Filled 2019-09-09: qty 9

## 2019-09-09 MED ORDER — BUPIVACAINE HCL (PF) 0.5 % IJ SOLN
INTRAMUSCULAR | Status: AC
  Filled 2019-09-09: qty 30

## 2019-09-09 MED ORDER — OXYTOCIN 40 UNITS IN NORMAL SALINE INFUSION - SIMPLE MED
2.5000 [IU]/h | INTRAVENOUS | Status: DC
  Administered 2019-09-09: 07:00:00 via INTRAVENOUS

## 2019-09-09 MED ORDER — LACTATED RINGERS IV SOLN
INTRAVENOUS | Status: DC
  Administered 2019-09-09: 12:00:00 via INTRAVENOUS

## 2019-09-09 MED ORDER — ACETAMINOPHEN 500 MG PO TABS
1000.0000 mg | ORAL_TABLET | Freq: Four times a day (QID) | ORAL | Status: AC
  Administered 2019-09-09 – 2019-09-10 (×4): 1000 mg via ORAL
  Filled 2019-09-09 (×4): qty 2

## 2019-09-09 MED ORDER — DIPHENHYDRAMINE HCL 50 MG/ML IJ SOLN: 12.5000 mg | INTRAMUSCULAR | Status: DC | PRN

## 2019-09-09 MED ORDER — OXYTOCIN 40 UNITS IN NORMAL SALINE INFUSION - SIMPLE MED
INTRAVENOUS | Status: DC | PRN
  Administered 2019-09-09: 1000 mL via INTRAVENOUS

## 2019-09-09 MED ORDER — SIMETHICONE 80 MG PO CHEW: 80.0000 mg | CHEWABLE_TABLET | Freq: Four times a day (QID) | ORAL | Status: DC | PRN

## 2019-09-09 MED ORDER — COCONUT OIL OIL
1.0000 "application " | TOPICAL_OIL | Status: DC | PRN
  Filled 2019-09-09: qty 120

## 2019-09-09 MED ORDER — PRENATAL MULTIVITAMIN CH
1.0000 | ORAL_TABLET | Freq: Every day | ORAL | Status: DC
  Administered 2019-09-09 – 2019-09-12 (×4): 1 via ORAL
  Filled 2019-09-09 (×4): qty 1

## 2019-09-09 MED ORDER — AMLODIPINE BESYLATE 5 MG PO TABS
5.0000 mg | ORAL_TABLET | Freq: Every day | ORAL | Status: DC
  Administered 2019-09-09: 5 mg via ORAL
  Filled 2019-09-09: qty 1

## 2019-09-09 MED ORDER — BUPIVACAINE HCL (PF) 0.5 % IJ SOLN
INTRAMUSCULAR | Status: DC | PRN
  Administered 2019-09-09: 2.6 mL via INTRATHECAL

## 2019-09-09 MED ORDER — KETOROLAC TROMETHAMINE 30 MG/ML IJ SOLN: 30.0000 mg | Freq: Four times a day (QID) | INTRAMUSCULAR | Status: DC | PRN

## 2019-09-09 MED ORDER — MORPHINE SULFATE (PF) 0.5 MG/ML IJ SOLN
INTRAMUSCULAR | Status: AC
  Filled 2019-09-09: qty 10

## 2019-09-09 MED ORDER — FENTANYL CITRATE (PF) 100 MCG/2ML IJ SOLN
INTRAMUSCULAR | Status: AC
  Filled 2019-09-09: qty 2

## 2019-09-09 MED ORDER — FENTANYL CITRATE (PF) 100 MCG/2ML IJ SOLN
INTRAMUSCULAR | Status: DC | PRN
  Administered 2019-09-09: 20 ug via INTRATHECAL

## 2019-09-09 MED ORDER — DIPHENHYDRAMINE HCL 25 MG PO CAPS: 25.0000 mg | ORAL_CAPSULE | ORAL | Status: DC | PRN

## 2019-09-09 MED ORDER — BUPIVACAINE HCL 0.5 % IJ SOLN
INTRAMUSCULAR | Status: DC | PRN
  Administered 2019-09-09: 10 mL

## 2019-09-09 MED ORDER — OXYTOCIN 40 UNITS IN NORMAL SALINE INFUSION - SIMPLE MED
INTRAVENOUS | Status: AC
  Administered 2019-09-09: 07:00:00 via INTRAVENOUS
  Filled 2019-09-09: qty 1000

## 2019-09-09 MED ORDER — SENNOSIDES-DOCUSATE SODIUM 8.6-50 MG PO TABS
2.0000 | ORAL_TABLET | ORAL | Status: DC
  Administered 2019-09-10: 2 via ORAL
  Filled 2019-09-09: qty 2

## 2019-09-09 MED ORDER — SIMETHICONE 80 MG PO CHEW: 80.0000 mg | CHEWABLE_TABLET | ORAL | Status: DC | PRN

## 2019-09-09 MED ORDER — CALCIUM GLUCONATE 10 % IV SOLN
INTRAVENOUS | Status: AC
  Filled 2019-09-09: qty 10

## 2019-09-09 MED ORDER — DOCUSATE SODIUM 100 MG PO CAPS
100.0000 mg | ORAL_CAPSULE | Freq: Two times a day (BID) | ORAL | Status: DC
  Administered 2019-09-09 – 2019-09-12 (×6): 100 mg via ORAL
  Filled 2019-09-09 (×6): qty 1

## 2019-09-09 MED ORDER — ONDANSETRON HCL 4 MG/2ML IJ SOLN: 4.0000 mg | Freq: Three times a day (TID) | INTRAMUSCULAR | Status: DC | PRN

## 2019-09-09 MED ORDER — ONDANSETRON HCL 4 MG/2ML IJ SOLN
INTRAMUSCULAR | Status: DC | PRN
  Administered 2019-09-09: 4 mg via INTRAVENOUS

## 2019-09-09 MED ORDER — SIMETHICONE 80 MG PO CHEW
80.0000 mg | CHEWABLE_TABLET | Freq: Three times a day (TID) | ORAL | Status: DC
  Administered 2019-09-09 – 2019-09-12 (×9): 80 mg via ORAL
  Filled 2019-09-09 (×10): qty 1

## 2019-09-09 SURGICAL SUPPLY — 26 items
CANISTER SUCT 3000ML PPV (MISCELLANEOUS) ×2 IMPLANT
CHLORAPREP W/TINT 26 (MISCELLANEOUS) ×4 IMPLANT
DERMABOND ADVANCED (GAUZE/BANDAGES/DRESSINGS) ×1
DERMABOND ADVANCED .7 DNX12 (GAUZE/BANDAGES/DRESSINGS) ×1 IMPLANT
DRSG OPSITE POSTOP 4X10 (GAUZE/BANDAGES/DRESSINGS) ×2 IMPLANT
ELECT CAUTERY BLADE 6.4 (BLADE) ×2 IMPLANT
ELECT REM PT RETURN 9FT ADLT (ELECTROSURGICAL) ×2
ELECTRODE REM PT RTRN 9FT ADLT (ELECTROSURGICAL) ×1 IMPLANT
GLOVE BIOGEL PI IND STRL 6.5 (GLOVE) ×4 IMPLANT
GLOVE BIOGEL PI INDICATOR 6.5 (GLOVE) ×4
GLOVE SKINSENSE NS SZ6.5 (GLOVE) ×4
GLOVE SKINSENSE STRL SZ6.5 (GLOVE) ×4 IMPLANT
GOWN STRL REUS W/ TWL LRG LVL3 (GOWN DISPOSABLE) ×1 IMPLANT
GOWN STRL REUS W/ TWL XL LVL3 (GOWN DISPOSABLE) ×2 IMPLANT
GOWN STRL REUS W/TWL LRG LVL3 (GOWN DISPOSABLE) ×1
GOWN STRL REUS W/TWL XL LVL3 (GOWN DISPOSABLE) ×2
NS IRRIG 1000ML POUR BTL (IV SOLUTION) ×2 IMPLANT
PACK C SECTION AR (MISCELLANEOUS) ×2 IMPLANT
PAD OB MATERNITY 4.3X12.25 (PERSONAL CARE ITEMS) ×4 IMPLANT
PAD PREP 24X41 OB/GYN DISP (PERSONAL CARE ITEMS) ×2 IMPLANT
PENCIL SMOKE ULTRAEVAC 22 CON (MISCELLANEOUS) ×2 IMPLANT
SUT MNCRL AB 4-0 PS2 18 (SUTURE) ×2 IMPLANT
SUT PLAIN 3-0 (SUTURE) ×2 IMPLANT
SUT VIC AB 0 CT1 36 (SUTURE) ×6 IMPLANT
SUT VIC AB 2-0 CT1 36 (SUTURE) ×2 IMPLANT
SYR 30ML LL (SYRINGE) ×4 IMPLANT

## 2019-09-09 NOTE — Lactation Note (Signed)
This note was copied from a baby's chart. Lactation Consultation Note  Patient Name: Toni Byrd [id number] Date: 09/09/2019 Reason for consult: Initial assessment;Preterm <34wks;1st time breastfeeding  LC called in to assist mom with beginning use of DEBP. Baby born at 29 weeks due to mom having gestational HTN. Baby in SCN.  Mom remains in LDR 4, on Mag2+ for 24 hours.  LC brought in DEBP, talked through assembly of the parts/pieces and fitting of size 70mm breast shield. Reviewed cleaning of parts/pieces in warm soapy water allowing them to air dry between pumping sessions, and reviewed milk storage.  LC assisted with holding parts through first pumping session, adjusting the suction prn. Mom notes no pain or discomfort with pumping. Mom plans to provide both her breastmilk and formula to baby [name] if possible.  LC provided education on breastfeeding basics, milk supply and demand, newborn stomach size, importance of frequent pumping, and taught hand expression.  LC will check back in with mom this afternoon.  Maternal Data Formula Feeding for Exclusion: No Has patient been taught Hand Expression?: Yes Does the patient have breastfeeding experience prior to this delivery?: No(first baby)  Feeding    LATCH Score                   Interventions Interventions: Breast feeding basics reviewed;Hand express;Coconut oil;DEBP  Lactation Tools Discussed/Used Tools: Pump Breast pump type: Double-Electric Breast Pump Pump Review: Setup, frequency, and cleaning;Milk Storage Initiated by:: Gerome Sam, MPH, IBCLC Date initiated:: 09/09/19   Consult Status Consult Status: Follow-up Date: 09/09/19 Follow-up type: In-patient    [name] 09/09/2019, 10:56 AM

## 2019-09-09 NOTE — Lactation Note (Signed)
This note was copied from a baby's chart. Lactation Consultation Note  Patient Name: Toni Byrd [id number] Date: 09/09/2019 Reason for consult: Follow-up assessment  LC assisted mom of [name] with pumping for the second time.  LC assembled the clean pieces, assisted with placement on the breast, and holding of the pieces during pumping session. No output at this time. Mom did attempt hand expression for 1-48mins post pumping. Coconut oil applied to aid in prevention of soreness while pumping.   Maternal Data Formula Feeding for Exclusion: No Has patient been taught Hand Expression?: Yes Does the patient have breastfeeding experience prior to this delivery?: No  Feeding    LATCH Score                   Interventions Interventions: Breast feeding basics reviewed;DEBP;Coconut oil  Lactation Tools Discussed/Used Tools: Pump Breast pump type: Double-Electric Breast Pump   Consult Status Consult Status: Follow-up Date: 09/09/19 Follow-up type: In-patient    Toni Byrd 09/09/2019, 3:24 PM

## 2019-09-09 NOTE — Progress Notes (Signed)
Reviewed with patient her surgical consent form and the risks of cesarean section. Given her enlarged fibroid uterus there is a possibility that she will need a classical cesarean section. She understands that if this is the case she will need subsequent cesarean sections at an early gestational age (83-37 weeks) and that she can not have vaginal births. We discussed the risks of hemorrhage, infection, and damage to surrounding pelvic and abdominal organ as well as the possibility of hysterectomy in the event of uncontrolled bleeding. Reviewed anticipated postpartum recovery. All questions answered.   Adrian Prows MD [facility], [facility] 09/09/2019 4:50 AM

## 2019-09-09 NOTE — Discharge Summary (Signed)
OB Discharge Summary     Patient Name: Toni Byrd DOB: Jan 11, 1981 MRN: [id number]  Date of admission: 09/08/2019 Delivering MD: Homero Fellers, MD  Date of Delivery: 09/08/2019  Date of discharge: 09/12/2019  Admitting diagnosis: 4 weeks preg  Intrauterine pregnancy: [redacted]w[redacted]d    Secondary diagnosis: Preeclampsia and IUGR     Discharge diagnosis: Preterm Pregnancy Delivered, Preeclampsia (severe), Reasons for cesarean section  Non-reassuring FHR and remote from delivery and elective primary                         Hospital course:  Sceduled C/S   38y.o. yo [id number] at [id number] admitted to the hospital 09/08/2019 for cesarean section with the following indication:Elective Primary. IUGR, less than 3%, preeclampsia with severe features, category II tracing with minimal variability, elective primary cesarean section Membrane Rupture Time/Date: 5:30 AM ,09/09/2019   Patient delivered a Viable infant.09/09/2019  Details of operation can be found in separate operative note.  Pateint had an uncomplicated postpartum course.  She is ambulating, tolerating a regular diet, passing flatus, and urinating well. Patient is discharged home in stable condition on  09/12/19                                                                        Post partum procedures:none  Complications: None  Physical exam on 09/12/2019: Vitals:   09/11/19 1945 09/11/19 2325 09/12/19 0430 09/12/19 0841  BP: (!) 142/93 139/87 (!) 141/92 (!) 142/90  Pulse: 97 93 88 88  Resp: _0 Temp: 98.1 F (36.7 C) 98.7 F (37.1 C) 97.9 F (36.6 C) 98.3 F (36.8 C)  TempSrc: Oral  Oral Oral  SpO2: 100% 97% 95% 100%  Weight:      Height:       General: alert, cooperative and no distress Lochia: appropriate Uterine Fundus: firm Incision: Healing well with no significant drainage DVT Evaluation: No evidence of DVT seen on physical exam.  Labs: Lab Results  Component Value Date   WBC 14.0 (H)  09/10/2019   HGB 11.7 (L) 09/10/2019   HCT 35.6 (L) 09/10/2019   MCV 85.8 09/10/2019   PLT 179 09/10/2019   CMP Latest Ref Rng & Units 09/08/2019  Glucose 70 - 99 mg/dL 97  BUN 6 - 20 mg/dL 12  Creatinine 0.44 - 1.00 mg/dL 0.49  Sodium 135 - 145 mmol/L 135  Potassium 3.5 - 5.1 mmol/L 3.3(L)  Chloride 98 - 111 mmol/L 103  CO2 22 - 32 mmol/L 23  Calcium 8.9 - 10.3 mg/dL 8.4(L)  Total Protein 6.5 - 8.1 g/dL 5.7(L)  Total Bilirubin 0.3 - 1.2 mg/dL 0.5  Alkaline Phos 38 - 126 U/L 169(H)  AST 15 - 41 U/L 32  ALT 0 - 44 U/L 26    Discharge instruction: per After Visit Summary.  Medications:  Allergies as of 09/12/2019      Reactions   Other Rash   ADHESIVE FROM BANDAIDS-"BURNS SKIN"      Medication List    TAKE these medications   acetaminophen 500 MG tablet Commonly known as: TYLENOL Take 2 tablets (1,000 mg total) by mouth every 6 (six) hours.   amLODipine  10 MG tablet Commonly known as: NORVASC Take 1 tablet (10 mg total) by mouth daily. Start taking on: September 13, 2019   bisacodyl 10 MG suppository Commonly known as: DULCOLAX Place 1 suppository (10 mg total) rectally daily as needed for moderate constipation.   Blood Pressure Kit 1 each by Does not apply route 2 (two) times daily.   docusate sodium 100 MG capsule Commonly known as: COLACE Take 1 capsule (100 mg total) by mouth 2 (two) times daily.   ferrous sulfate 325 (65 FE) MG tablet Take 325 mg by mouth daily with breakfast.   ibuprofen 600 MG tablet Commonly known as: ADVIL Take 1 tablet (600 mg total) by mouth every 6 (six) hours.   multivitamin-prenatal 27-0.8 MG Tabs tablet Take 1 tablet by mouth daily at 12 noon.   traMADol 50 MG tablet Commonly known as: Ultram Take 1 tablet (50 mg total) by mouth every 6 (six) hours as needed.   vitamin C 100 MG tablet Take 150 mg by mouth daily.            Discharge Care Instructions  (From admission, onward)         Start     Ordered    09/12/19 0000  Discharge wound care:    Comments: SHOWER DAILY Wash incision gently with soap and water.  Call office with any drainage, redness, or firmness of the incision.   09/12/19 1005          Diet: routine diet  Activity: Advance as tolerated. Pelvic rest for 6 weeks.   Outpatient follow up: Follow-up Information    Schuman, Christanna R, MD.   Specialty: Obstetrics and Gynecology Contact information: [address] [place] [address] [phone]        Schuman, Christanna R, MD. Schedule an appointment as soon as possible for a visit in 1 week.   Specialty: Obstetrics and Gynecology Why: For incision check Contact information: [address]  [place] [address] [phone]             Postpartum contraception: None Rhogam Given postpartum: no Rubella vaccine given postpartum: no Varicella vaccine given postpartum: no TDaP given antepartum or postpartum: Yes  Newborn Data: Live born female  Birth Weight:   APGAR: ,   Newborn Delivery   Birth date/time: 09/09/2019 05:30:00 Delivery type: C-Section, Low Vertical C-section categorization: Primary       Baby Feeding: Breast  Disposition:NICU  SIGNED:   Christanna R Schuman, MD 09/12/2019 10:06 AM    

## 2019-09-09 NOTE — Transfer of Care (Signed)
Immediate Anesthesia Transfer of Care Note  Patient: Toni Byrd  Procedure(s) Performed: CESAREAN SECTION (N/A )  Patient Location: Mother/Baby  Anesthesia Type:Spinal  Level of Consciousness: awake, alert , oriented and patient cooperative  Airway & Oxygen Therapy: Patient Spontanous Breathing  Post-op Assessment: Report given to RN and Post -op Vital signs reviewed and stable  Post vital signs: Reviewed and stable  Last Vitals:  Vitals Value Taken Time  BP 135/84 09/09/19 0634  Temp    Pulse 71 09/09/19 0636  Resp 19 09/09/19 0636  SpO2 96 % 09/09/19 0636  Vitals shown include unvalidated device data.  Last Pain:  Vitals:   09/09/19 0256  TempSrc: Oral  PainSc:          Complications: No apparent anesthesia complications

## 2019-09-09 NOTE — Anesthesia Post-op Follow-up Note (Signed)
Anesthesia QCDR form completed.        

## 2019-09-09 NOTE — Progress Notes (Signed)
[facility] for IUGR, non reassuring FHR ,preeclampsia with severe features (blood pressure in severe range) Subjective:  Feels well. Denies headache, SOB, nausea/vomiting, CP with elevated blood pressure this AM. Baby in NICU. Will be pumping. Tolerating clear liquids. Just given 20 mgm IV labetalol for severe range blood pressure.    Objective:  Blood pressure (!) 167/105, pulse 80, temperature 97.8 F (36.6 C), temperature source Oral, resp. rate (!) 21, height 5\' 7"  (1.702 m), weight 96.6 kg, last menstrual period 01/23/2019, SpO2 97 %, unknown if currently breastfeeding. Repeat blood pressure after labetalol was 150/90 Urine output 100 to 400 ml/hr  General: BF in NAD Pulmonary: no increased work of breathing/ CTAB Heart: RRR without murmur Abdomen: soft, but tympanic,  Appropriately tender Incision: Honeycomb dressing C&D&I Lochia appropriate Extremities: SCDs on  Results for orders placed or performed during the hospital encounter of 09/08/19 (from the past 72 hour(s))  CBC     Status: Abnormal   Collection Time: 09/08/19  4:46 PM  Result Value Ref Range   WBC 15.5 (H) 4.0 - 10.5 K/uL   RBC 4.63 3.87 - 5.11 MIL/uL   Hemoglobin 12.9 12.0 - 15.0 g/dL   HCT 39.1 36.0 - 46.0 %   MCV 84.4 80.0 - 100.0 fL   MCH 27.9 26.0 - 34.0 pg   MCHC 33.0 30.0 - 36.0 g/dL   RDW 13.9 11.5 - 15.5 %   Platelets 186 150 - 400 K/uL   nRBC 1.7 (H) 0.0 - 0.2 %    Comment: Performed at [facility], [address] [place], [place] [address]  Type and screen [place]     Status: None (Preliminary result)   Collection Time: 09/08/19  4:46 PM  Result Value Ref Range   ABO/RH(D) O POS    Antibody Screen NEG    Sample Expiration      09/11/2019,2359 Performed at [facility], [address] [place], [place] [address]    Unit Number [id number]    Blood Component Type RED CELLS,LR    Unit division 00    Status of Unit ALLOCATED    Transfusion Status OK TO TRANSFUSE    Crossmatch Result Compatible    Unit Number [id number]    Blood Component Type RED CELLS,LR    Unit division 00    Status of Unit ALLOCATED    Transfusion Status OK TO TRANSFUSE    Crossmatch Result Compatible   Comprehensive metabolic panel     Status: Abnormal   Collection Time: 09/08/19  4:46 PM  Result Value Ref Range   Sodium 135 135 - 145 mmol/L   Potassium 3.3 (L) 3.5 - 5.1 mmol/L   Chloride 103 98 - 111 mmol/L   CO2 23 22 - 32 mmol/L   Glucose, Bld 97 70 - 99 mg/dL   BUN 12 6 - 20 mg/dL   Creatinine, Ser 0.49 0.44 - 1.00 mg/dL   Calcium 8.4 (L) 8.9 - 10.3 mg/dL   Total Protein 5.7 (L) 6.5 - 8.1 g/dL   Albumin 2.6 (L) 3.5 - 5.0 g/dL   AST 32 15 - 41 U/L   ALT 26 0 - 44 U/L   Alkaline Phosphatase 169 (H) 38 - 126 U/L   Total Bilirubin 0.5 0.3 - 1.2 mg/dL   GFR calc non Af Amer >60 >60 mL/min   GFR calc Af Amer >60 >60 mL/min   Anion gap 9 5 - 15    Comment: Performed  at [facility], [address] [place], [place] [address]  Protein / creatinine ratio, urine     Status: Abnormal   Collection Time: 09/08/19  4:46 PM  Result Value Ref Range   Creatinine, Urine 63 mg/dL   Total Protein, Urine 214 mg/dL    Comment: RESULT CONFIRMED BY MANUAL DILUTION KLW NO NORMAL RANGE ESTABLISHED FOR THIS TEST    Protein Creatinine Ratio 3.40 (H) 0.00 - 0.15 mg/mg[Cre]    Comment: Performed at [facility], [address] [place], [place] [address]  SARS Coronavirus 2 by RT PCR (hospital order, performed in [place] hospital lab) Nasopharyngeal Nasopharyngeal Swab     Status: None   Collection Time: 09/08/19  4:46 PM   Specimen: Nasopharyngeal Swab  Result Value Ref Range   SARS Coronavirus 2 NEGATIVE NEGATIVE    Comment: (NOTE) If result is NEGATIVE SARS-CoV-2 target nucleic acids are NOT DETECTED. The SARS-CoV-2 RNA is generally detectable in upper and lower  respiratory specimens during the acute phase of  infection. The lowest  concentration of SARS-CoV-2 viral copies this assay can detect is 250  copies / mL. A negative result does not preclude SARS-CoV-2 infection  and should not be used as the sole basis for treatment or other  patient management decisions.  A negative result may occur with  improper specimen collection / handling, submission of specimen other  than nasopharyngeal swab, presence of viral mutation(s) within the  areas targeted by this assay, and inadequate number of viral copies  (<250 copies / mL). A negative result must be combined with clinical  observations, patient history, and epidemiological information. If result is POSITIVE SARS-CoV-2 target nucleic acids are DETECTED. The SARS-CoV-2 RNA is generally detectable in upper and lower  respiratory specimens dur ing the acute phase of infection.  Positive  results are indicative of active infection with SARS-CoV-2.  Clinical  correlation with patient history and other diagnostic information is  necessary to determine patient infection status.  Positive results do  not rule out bacterial infection or co-infection with other viruses. If result is PRESUMPTIVE POSTIVE SARS-CoV-2 nucleic acids MAY BE PRESENT.   A presumptive positive result was obtained on the submitted specimen  and confirmed on repeat testing.  While 2019 novel coronavirus  (SARS-CoV-2) nucleic acids may be present in the submitted sample  additional confirmatory testing may be necessary for epidemiological  and / or clinical management purposes  to differentiate between  SARS-CoV-2 and other Sarbecovirus currently known to infect humans.  If clinically indicated additional testing with an alternate test  methodology [phone]) is advised. The SARS-CoV-2 RNA is generally  detectable in upper and lower respiratory sp ecimens during the acute  phase of infection. The expected result is Negative. Fact Sheet for Patients:   StrictlyIdeas.no Fact Sheet for Healthcare Providers: BankingDealers.co.za This test is not yet approved or cleared by the [place] FDA and has been authorized for detection and/or diagnosis of SARS-CoV-2 by FDA under an Emergency Use Authorization (EUA).  This EUA will remain in effect (meaning this test can be used) for the duration of the COVID-19 declaration under Section 564(b)(1) of the Act, 21 U.S.C. section 360bbb-3(b)(1), unless the authorization is terminated or revoked sooner. Performed at [facility], [address] [place], [place] [address]   Group B strep by PCR     Status: None   Collection Time: 09/08/19  6:24 PM   Specimen: Vaginal/Rectal; Genital  Result Value Ref Range  Group B strep by PCR NEGATIVE NEGATIVE    Comment: (NOTE) Intrapartum testing with Xpert GBS assay should be used as an adjunct to other methods available and not used to replace antepartum testing (at 35-[redacted] weeks gestation). Performed at [facility], [address] [place], [place] [address]   Prepare RBC     Status: None   Collection Time: 09/08/19 11:43 PM  Result Value Ref Range   Order Confirmation      ORDER PROCESSED BY BLOOD BANK Performed at [facility], [address] [place], [place] [address]   CBC with Differential/Platelet     Status: Abnormal   Collection Time: 09/09/19  2:58 AM  Result Value Ref Range   WBC 15.0 (H) 4.0 - 10.5 K/uL   RBC 4.38 3.87 - 5.11 MIL/uL   Hemoglobin 12.2 12.0 - 15.0 g/dL   HCT 37.1 36.0 - 46.0 %   MCV 84.7 80.0 - 100.0 fL   MCH 27.9 26.0 - 34.0 pg   MCHC 32.9 30.0 - 36.0 g/dL   RDW 13.9 11.5 - 15.5 %   Platelets 195 150 - 400 K/uL   nRBC 1.3 (H) 0.0 - 0.2 %   Neutrophils Relative % 72 %   Neutro Abs 10.8 (H) 1.7 - 7.7 K/uL   Lymphocytes Relative 20 %   Lymphs Abs 2.9 0.7 - 4.0 K/uL   Monocytes Relative 6 %   Monocytes Absolute 0.9 0.1 - 1.0 K/uL    Eosinophils Relative 0 %   Eosinophils Absolute 0.0 0.0 - 0.5 K/uL   Basophils Relative 0 %   Basophils Absolute 0.0 0.0 - 0.1 K/uL   Immature Granulocytes 2 %   Abs Immature Granulocytes 0.26 (H) 0.00 - 0.07 K/uL    Comment: Performed at [facility], [address], [place],  [address]     Assessment/Plan:   38 y.o. [id number] postoperativeday # 0 Continue postoperative and postpartum care  Advance diet as tolerated  OOB in wheelchair this afternoon to see baby if blood pressures allow  Cough and deep breath every 2 hours  O POS/ RI/ VI  TDAP 12/26/2018  Breast   Preeclampsia with severe features  Start Norvasc 5 mgm orally  Labetalol IV prn severe range blood pressures  Monitor blood pressures and urine output closely  Dalia Heading, CNM

## 2019-09-09 NOTE — Op Note (Signed)
Cesarean Section Procedure Note 09/09/19  Pre-operative Diagnosis:  1. IUGR less than 3rd percentile  2. Nonreactive FHR tracing, category II- remote from delivery  3. Preeclampsia with severe features   4. Enlarged fibroid uterus Post-operative Diagnosis: same, delivered.  1. IUGR less than 3rd percentile  2. Nonreactive FHR tracing, category II- remote from delivery  3. Preeclampsia with severe features   4. Enlarged fibroid uterus  5. True knot in umbilical cord Procedure: Primary Low Transverse Cesarean Section  Surgeon: Adrian Prows MD  Assistant(s): Malachy Mood - No other skilled surgical assistant available. Anesthesia: Spinal Estimated Blood Loss: 0000000 cc Complications: None; patient tolerated the procedure well.   Disposition: PACU - hemodynamically stable. Condition: stable   Findings: A female infant in the cephalic presentation. True knot in umbilical cord  Amniotic fluid - clear  Birth weight: 2 lbs 9oz Apgars of 8 and 9.  Intact placenta with a three-vessel cord. Grossly normal uterus, tubes and ovaries bilaterally. No intraabdominal adhesions were noted.   Procedure Details    The patient was taken to operating room, identified as the correct patient and the procedure verified as C-Section Delivery. A time out was held and the above information confirmed. After induction of anesthesia, the patient was draped and prepped in the usual sterile manner. A Pfannenstiel incision was made and carried down through the subcutaneous tissue to the fascia. Fascial incision was made and extended transversely with the Mayo scissors. The fascia was separated from the underlying rectus tissue superiorly and inferiorly. The peritoneum was identified and entered bluntly. Peritoneal incision was extended longitudinally. A low transverse hysterotomy was made. The fetus was delivered atraumatically. The umbilical cord was clamped x2 and cut and the infant was handed to the awaiting  pediatricians. The placenta was removed manually and was small with calcifications  3-vessel cord with a true knot. The uterus was cleared of all clot and debris. The hysterotomy was closed with running sutures of 0 Vicryl suture. A second imbricating layer was placed with the same suture. Excellent hemostasis was observed.  The pelvis was irrigated and again, excellent hemostasis was noted. The peritoneum was closed with a running stitch of 2-0 Vicryl. The On Q Pain pump System was then placed.  Trocars were placed through the abdominal wall into the subfascial space and these were used to thread the silver soaker cathaters into place.The rectus muscles were inspected and were hemostatic. The rectus fascia was then reapproximated with running sutures of 0-vicryl, with careful placement not to incorporate the cathaters. Subcutaneous tissues are then irrigated with saline and hemostasis was observed. The subcutaneous fat was approximated with 3-0 plain and a running stitch.  The skin was closed with 4-0 monocryl suture in a subcuticular fashion followed by skin adhesive. The cathaters are flushed each with 5 mL of Bupivicaine and stabilized into place with dressing. Instrument, sponge, and needle counts were correct prior to the abdominal closure and at the conclusion of the case.  The patient tolerated the procedure well and was transferred to the recovery room in stable condition.   [place], Toni Byrd 09/09/19 6:25 AM

## 2019-09-09 NOTE — Progress Notes (Signed)
Pt BP 169/103, repeat BP 167/105, C.Danise Mina, CNM notified and aware, given 20mg  Labetolol IV per order. Repeat BP 150/90. C.Gutierrez, CNM aware. Will continue to cycle BP.

## 2019-09-09 NOTE — Anesthesia Preprocedure Evaluation (Signed)
Anesthesia Evaluation  Patient identified by MRN, date of birth, ID band Patient awake    Reviewed: Allergy & Precautions, H&P , NPO status , Patient's Chart, lab work & pertinent test results, reviewed documented beta blocker date and time   Airway Mallampati: II  TM Distance: >3 FB Neck ROM: full    Dental no notable dental hx. (+) Teeth Intact   Pulmonary neg pulmonary ROS, Current Smoker,    Pulmonary exam normal breath sounds clear to auscultation       Cardiovascular Exercise Tolerance: Good hypertension, On Medications  Rhythm:regular Rate:Normal     Neuro/Psych negative neurological ROS  negative psych ROS   GI/Hepatic negative GI ROS, Neg liver ROS,   Endo/Other  negative endocrine ROSdiabetes, Gestational  Renal/GU      Musculoskeletal   Abdominal   Peds  Hematology negative hematology ROS (+)   Anesthesia Other Findings   Reproductive/Obstetrics (+) Pregnancy                             Anesthesia Physical Anesthesia Plan  ASA: II and emergent  Anesthesia Plan: Epidural and Spinal   Post-op Pain Management:    Induction:   PONV Risk Score and Plan:   Airway Management Planned:   Additional Equipment:   Intra-op Plan:   Post-operative Plan:   Informed Consent: I have reviewed the patients History and Physical, chart, labs and discussed the procedure including the risks, benefits and alternatives for the proposed anesthesia with the patient or authorized representative who has indicated his/her understanding and acceptance.       Plan Discussed with:   Anesthesia Plan Comments:         Anesthesia Quick Evaluation

## 2019-09-09 NOTE — Anesthesia Procedure Notes (Signed)
Spinal  Patient location during procedure: OR Staffing Performed: anesthesiologist  Preanesthetic Checklist Completed: patient identified, site marked, surgical consent, pre-op evaluation, timeout performed, IV checked, risks and benefits discussed and monitors and equipment checked Spinal Block Patient position: sitting Prep: DuraPrep Patient monitoring: heart rate, cardiac monitor, continuous pulse ox and blood pressure Approach: midline Location: L4-5 Injection technique: single-shot Needle Needle type: Sprotte  Needle gauge: 24 G Needle length: 9 cm Assessment Sensory level: T4 Additional Notes Single pass and tolerated well with vsst. No pain reported.  Good flow and easy pass.

## 2019-09-10 ENCOUNTER — Other Ambulatory Visit: Payer: 59

## 2019-09-10 LAB — BPAM RBC
Blood Product Expiration Date: 202011262359
Blood Product Expiration Date: 202011262359
Unit Type and Rh: 5100
Unit Type and Rh: 5100

## 2019-09-10 LAB — TYPE AND SCREEN
ABO/RH(D): O POS
Antibody Screen: NEGATIVE
Unit division: 0
Unit division: 0

## 2019-09-10 LAB — CBC
HCT: 35.6 % — ABNORMAL LOW (ref 36.0–46.0)
Hemoglobin: 11.7 g/dL — ABNORMAL LOW (ref 12.0–15.0)
MCH: 28.2 pg (ref 26.0–34.0)
MCHC: 32.9 g/dL (ref 30.0–36.0)
MCV: 85.8 fL (ref 80.0–100.0)
Platelets: 179 10*3/uL (ref 150–400)
RBC: 4.15 MIL/uL (ref 3.87–5.11)
RDW: 14 % (ref 11.5–15.5)
WBC: 14 10*3/uL — ABNORMAL HIGH (ref 4.0–10.5)
nRBC: 0.5 % — ABNORMAL HIGH (ref 0.0–0.2)

## 2019-09-10 LAB — PREPARE RBC (CROSSMATCH)

## 2019-09-10 LAB — SURGICAL PATHOLOGY

## 2019-09-10 MED ORDER — IBUPROFEN 600 MG PO TABS
600.0000 mg | ORAL_TABLET | Freq: Four times a day (QID) | ORAL | Status: DC
  Administered 2019-09-10 – 2019-09-12 (×10): 600 mg via ORAL
  Filled 2019-09-10 (×10): qty 1

## 2019-09-10 MED ORDER — ZOLPIDEM TARTRATE 5 MG PO TABS: 5.0000 mg | ORAL_TABLET | Freq: Every evening | ORAL | Status: DC | PRN

## 2019-09-10 MED ORDER — HYDROMORPHONE HCL 1 MG/ML IJ SOLN: 0.2000 mg | INTRAMUSCULAR | Status: DC | PRN

## 2019-09-10 MED ORDER — NALOXONE HCL 4 MG/10ML IJ SOLN
1.0000 ug/kg/h | INTRAVENOUS | Status: DC | PRN
  Filled 2019-09-10: qty 5

## 2019-09-10 MED ORDER — NALBUPHINE HCL 10 MG/ML IJ SOLN: 5.0000 mg | Freq: Once | INTRAMUSCULAR | Status: DC | PRN

## 2019-09-10 MED ORDER — AMLODIPINE BESYLATE 10 MG PO TABS
10.0000 mg | ORAL_TABLET | Freq: Every day | ORAL | Status: DC
  Filled 2019-09-10: qty 1

## 2019-09-10 MED ORDER — BISACODYL 10 MG RE SUPP
10.0000 mg | Freq: Every day | RECTAL | Status: DC | PRN
  Filled 2019-09-10: qty 1

## 2019-09-10 MED ORDER — NALBUPHINE HCL 10 MG/ML IJ SOLN: 5.0000 mg | INTRAMUSCULAR | Status: DC | PRN

## 2019-09-10 MED ORDER — ACETAMINOPHEN 500 MG PO TABS
1000.0000 mg | ORAL_TABLET | Freq: Four times a day (QID) | ORAL | Status: DC
  Administered 2019-09-10 – 2019-09-12 (×9): 1000 mg via ORAL
  Filled 2019-09-10 (×10): qty 2

## 2019-09-10 MED ORDER — FLEET ENEMA 7-19 GM/118ML RE ENEM: 1.0000 | ENEMA | Freq: Every day | RECTAL | Status: DC | PRN

## 2019-09-10 MED ORDER — OXYCODONE HCL 5 MG PO TABS: 5.0000 mg | ORAL_TABLET | ORAL | Status: DC | PRN

## 2019-09-10 MED ORDER — DIBUCAINE (PERIANAL) 1 % EX OINT: 1.0000 "application " | TOPICAL_OINTMENT | CUTANEOUS | Status: DC | PRN

## 2019-09-10 MED ORDER — SODIUM CHLORIDE 0.9% FLUSH
3.0000 mL | INTRAVENOUS | Status: DC | PRN
  Administered 2019-09-10: 3 mL via INTRAVENOUS
  Filled 2019-09-10: qty 3

## 2019-09-10 MED ORDER — DIPHENHYDRAMINE HCL 25 MG PO CAPS: 25.0000 mg | ORAL_CAPSULE | Freq: Four times a day (QID) | ORAL | Status: DC | PRN

## 2019-09-10 MED ORDER — MEPERIDINE HCL 25 MG/ML IJ SOLN: 6.2500 mg | INTRAMUSCULAR | Status: DC | PRN

## 2019-09-10 MED ORDER — WITCH HAZEL-GLYCERIN EX PADS: 1.0000 "application " | MEDICATED_PAD | CUTANEOUS | Status: DC | PRN

## 2019-09-10 MED ORDER — AMLODIPINE BESYLATE 10 MG PO TABS
10.0000 mg | ORAL_TABLET | Freq: Every day | ORAL | Status: DC
  Administered 2019-09-10 – 2019-09-12 (×3): 10 mg via ORAL
  Filled 2019-09-10 (×3): qty 1

## 2019-09-10 MED ORDER — NALOXONE HCL 0.4 MG/ML IJ SOLN: 0.4000 mg | INTRAMUSCULAR | Status: DC | PRN

## 2019-09-10 NOTE — Progress Notes (Signed)
POD #1 S/P LTCS for IUGR, non reassuring FHR, preeclampsia with severe features (blood pressure criteria) Subjective:   Doing well. Tolerating regular diet. Has been to see baby in the NICU. Pumping breast milk.  Magnesium sulfate discontinued at 0530. Foley also removed.  Objective:  Blood pressure 126/83, pulse 80, temperature 98.3 F (36.8 C), temperature source Oral, resp. rate 16, height 5\' 7"  (1.702 m), weight 96.6 kg, last menstrual period 01/23/2019, SpO2 96 %, unknown if currently breastfeeding. Temp:  [97.6 F (36.4 C)-98.4 F (36.9 C)] 98.4 F (36.9 C) (10/29 0717) Pulse Rate:  [65-130] 91 (10/29 0717) Resp:  [8-25] 16 (10/29 0500) BP: (126-169)/(77-105) 157/95 (10/29 0717) SpO2:  [80 %-100 %] 97 % (10/29 0705)  Blood pressures with downward trend overnight.  Intake 6047 Urine out: 12, 685.  General: BF in NAD Pulmonary: no increased work of breathing/ ?harsh breath sounds in AM, cleared a little with cough Heart: RRR without murmur Incision: Honeycomb dressing C&D&I, ON Q intact Extremities: no edema, no erythema, no tenderness  Results for orders placed or performed during the hospital encounter of 09/08/19 (from the past 72 hour(s))  CBC     Status: Abnormal   Collection Time: 09/08/19  4:46 PM  Result Value Ref Range   WBC 15.5 (H) 4.0 - 10.5 K/uL   RBC 4.63 3.87 - 5.11 MIL/uL   Hemoglobin 12.9 12.0 - 15.0 g/dL   HCT 39.1 36.0 - 46.0 %   MCV 84.4 80.0 - 100.0 fL   MCH 27.9 26.0 - 34.0 pg   MCHC 33.0 30.0 - 36.0 g/dL   RDW 13.9 11.5 - 15.5 %   Platelets 186 150 - 400 K/uL   nRBC 1.7 (H) 0.0 - 0.2 %    Comment: Performed at [facility], [address] [place], [place] [address]  Type and screen [place]     Status: None (Preliminary result)   Collection Time: 09/08/19  4:46 PM  Result Value Ref Range   ABO/RH(D) O POS    Antibody Screen NEG    Sample Expiration      09/11/2019,2359 Performed at [facility], [address] [place], [place] [address]    Unit Number [id number]    Blood Component Type RED CELLS,LR    Unit division 00    Status of Unit ALLOCATED    Transfusion Status OK TO TRANSFUSE    Crossmatch Result Compatible    Unit Number [id number]    Blood Component Type RED CELLS,LR    Unit division 00    Status of Unit ALLOCATED    Transfusion Status OK TO TRANSFUSE    Crossmatch Result Compatible   Comprehensive metabolic panel     Status: Abnormal   Collection Time: 09/08/19  4:46 PM  Result Value Ref Range   Sodium 135 135 - 145 mmol/L   Potassium 3.3 (L) 3.5 - 5.1 mmol/L   Chloride 103 98 - 111 mmol/L   CO2 23 22 - 32 mmol/L   Glucose, Bld 97 70 - 99 mg/dL   BUN 12 6 - 20 mg/dL   Creatinine, Ser 0.49 0.44 - 1.00 mg/dL   Calcium 8.4 (L) 8.9 - 10.3 mg/dL   Total Protein 5.7 (L) 6.5 - 8.1 g/dL   Albumin 2.6 (L) 3.5 - 5.0 g/dL   AST 32 15 - 41 U/L   ALT 26 0 - 44 U/L   Alkaline Phosphatase 169 (H) 38 - 126 U/L   Total  Bilirubin 0.5 0.3 - 1.2 mg/dL   GFR calc non Af Amer >60 >60 mL/min   GFR calc Af Amer >60 >60 mL/min   Anion gap 9 5 - 15    Comment: Performed at [facility], [address] [place], [place] [address]  RPR     Status: None   Collection Time: 09/08/19  4:46 PM  Result Value Ref Range   RPR Ser Ql NON REACTIVE NON REACTIVE    Comment: Performed at [facility], [address] [place], [place] [address]  Protein / creatinine ratio, urine     Status: Abnormal   Collection Time: 09/08/19  4:46 PM  Result Value Ref Range   Creatinine, Urine 63 mg/dL   Total Protein, Urine 214 mg/dL    Comment: RESULT CONFIRMED BY MANUAL DILUTION KLW NO NORMAL RANGE ESTABLISHED FOR THIS TEST    Protein Creatinine Ratio 3.40 (H) 0.00 - 0.15 mg/mg[Cre]    Comment: Performed at [facility], [address] [place], [place] [address]  SARS Coronavirus 2 by RT PCR (hospital order, performed in [place] hospital lab) Nasopharyngeal  Nasopharyngeal Swab     Status: None   Collection Time: 09/08/19  4:46 PM   Specimen: Nasopharyngeal Swab  Result Value Ref Range   SARS Coronavirus 2 NEGATIVE NEGATIVE    Comment: (NOTE) If result is NEGATIVE SARS-CoV-2 target nucleic acids are NOT DETECTED. The SARS-CoV-2 RNA is generally detectable in upper and lower  respiratory specimens during the acute phase of infection. The lowest  concentration of SARS-CoV-2 viral copies this assay can detect is 250  copies / mL. A negative result does not preclude SARS-CoV-2 infection  and should not be used as the sole basis for treatment or other  patient management decisions.  A negative result may occur with  improper specimen collection / handling, submission of specimen other  than nasopharyngeal swab, presence of viral mutation(s) within the  areas targeted by this assay, and inadequate number of viral copies  (<250 copies / mL). A negative result must be combined with clinical  observations, patient history, and epidemiological information. If result is POSITIVE SARS-CoV-2 target nucleic acids are DETECTED. The SARS-CoV-2 RNA is generally detectable in upper and lower  respiratory specimens dur ing the acute phase of infection.  Positive  results are indicative of active infection with SARS-CoV-2.  Clinical  correlation with patient history and other diagnostic information is  necessary to determine patient infection status.  Positive results do  not rule out bacterial infection or co-infection with other viruses. If result is PRESUMPTIVE POSTIVE SARS-CoV-2 nucleic acids MAY BE PRESENT.   A presumptive positive result was obtained on the submitted specimen  and confirmed on repeat testing.  While 2019 novel coronavirus  (SARS-CoV-2) nucleic acids may be present in the submitted sample  additional confirmatory testing may be necessary for epidemiological  and / or clinical management purposes  to differentiate between  SARS-CoV-2  and other Sarbecovirus currently known to infect humans.  If clinically indicated additional testing with an alternate test  methodology [phone]) is advised. The SARS-CoV-2 RNA is generally  detectable in upper and lower respiratory sp ecimens during the acute  phase of infection. The expected result is Negative. Fact Sheet for Patients:  StrictlyIdeas.no Fact Sheet for Healthcare Providers: BankingDealers.co.za This test is not yet approved or cleared by the [place] FDA and has been authorized for detection and/or diagnosis of SARS-CoV-2 by FDA under an Emergency Use  Authorization (EUA).  This EUA will remain in effect (meaning this test can be used) for the duration of the COVID-19 declaration under Section 564(b)(1) of the Act, 21 U.S.C. section 360bbb-3(b)(1), unless the authorization is terminated or revoked sooner. Performed at [facility], [address] [place], [place] [address]   Group B strep by PCR     Status: None   Collection Time: 09/08/19  6:24 PM   Specimen: Vaginal/Rectal; Genital  Result Value Ref Range   Group B strep by PCR NEGATIVE NEGATIVE    Comment: (NOTE) Intrapartum testing with Xpert GBS assay should be used as an adjunct to other methods available and not used to replace antepartum testing (at 35-[redacted] weeks gestation). Performed at [facility], [address] [place], [place] [address]   Prepare RBC     Status: None   Collection Time: 09/08/19 11:43 PM  Result Value Ref Range   Order Confirmation      ORDER PROCESSED BY BLOOD BANK Performed at [facility], [address] [place], [place] [address]   CBC with Differential/Platelet     Status: Abnormal   Collection Time: 09/09/19  2:58 AM  Result Value Ref Range   WBC 15.0 (H) 4.0 - 10.5 K/uL   RBC 4.38 3.87 - 5.11 MIL/uL   Hemoglobin 12.2 12.0 - 15.0 g/dL   HCT 37.1 36.0 - 46.0 %   MCV 84.7 80.0 - 100.0 fL    MCH 27.9 26.0 - 34.0 pg   MCHC 32.9 30.0 - 36.0 g/dL   RDW 13.9 11.5 - 15.5 %   Platelets 195 150 - 400 K/uL   nRBC 1.3 (H) 0.0 - 0.2 %   Neutrophils Relative % 72 %   Neutro Abs 10.8 (H) 1.7 - 7.7 K/uL   Lymphocytes Relative 20 %   Lymphs Abs 2.9 0.7 - 4.0 K/uL   Monocytes Relative 6 %   Monocytes Absolute 0.9 0.1 - 1.0 K/uL   Eosinophils Relative 0 %   Eosinophils Absolute 0.0 0.0 - 0.5 K/uL   Basophils Relative 0 %   Basophils Absolute 0.0 0.0 - 0.1 K/uL   Immature Granulocytes 2 %   Abs Immature Granulocytes 0.26 (H) 0.00 - 0.07 K/uL    Comment: Performed at [facility], [address] [place], [place] [address]  CBC     Status: Abnormal   Collection Time: 09/10/19  5:31 AM  Result Value Ref Range   WBC 14.0 (H) 4.0 - 10.5 K/uL   RBC 4.15 3.87 - 5.11 MIL/uL   Hemoglobin 11.7 (L) 12.0 - 15.0 g/dL   HCT 35.6 (L) 36.0 - 46.0 %   MCV 85.8 80.0 - 100.0 fL   MCH 28.2 26.0 - 34.0 pg   MCHC 32.9 30.0 - 36.0 g/dL   RDW 14.0 11.5 - 15.5 %   Platelets 179 150 - 400 K/uL   nRBC 0.5 (H) 0.0 - 0.2 %    Comment: Performed at [facility], [address], [place], [place] [address]     Assessment:   38 y.o. [id number] postoperativeday # 1-stable.   Assessment/Plan:   Continue postoperative and postpartum care             Continue regular diet              Ambulate today             Cough and deep breath every 2 hours   Trial of voiding  O POS/ RI/ VI             TDAP 12/26/2018             Breast              Preeclampsia with severe features  Transfer to [facility] unit             Continue Norvasc 5 mgm orally-may need to increase dose if blood   pressures are elevated with patient getting OOB and ambulating             Labetalol IV prn severe range blood pressures             Monitor blood pressures and urine output closely  Dalia Heading, CNM

## 2019-09-10 NOTE — Progress Notes (Addendum)
POD#1 p low vertical CS/pre-eclampsia with severe features Subjective:  Up to bathroom, no difficulties with ambulating or voiding. Feels well this morning. No headaches, pain, or visual changes. Ready to eat breakfast.  Objective:  Blood pressure (!) 158/101, pulse 84, temperature 98.3 F (36.8 C), resp. rate 18, height 5\' 7"  (1.702 m), weight 96.6 kg, last menstrual period 01/23/2019, SpO2 98 %.  General: NAD Pulmonary: CTAB, no adventitious sounds (though externally a bit noisy when breathing with open mouth) Cardiovascular: RRR, no murmurs, rubs, or gallops Abdomen: non-distended, non-tender, fundus firm, lochia appropriate Incision: Dressing C/D/I, On-Q in place Extremities: no edema, no erythema, no tenderness  Results for orders placed or performed during the hospital encounter of 09/08/19 (from the past 72 hour(s))  CBC     Status: Abnormal   Collection Time: 09/08/19  4:46 PM  Result Value Ref Range   WBC 15.5 (H) 4.0 - 10.5 K/uL   RBC 4.63 3.87 - 5.11 MIL/uL   Hemoglobin 12.9 12.0 - 15.0 g/dL   HCT 39.1 36.0 - 46.0 %   MCV 84.4 80.0 - 100.0 fL   MCH 27.9 26.0 - 34.0 pg   MCHC 33.0 30.0 - 36.0 g/dL   RDW 13.9 11.5 - 15.5 %   Platelets 186 150 - 400 K/uL   nRBC 1.7 (H) 0.0 - 0.2 %    Comment: Performed at [facility], [address] [place], [place] [address]  Type and screen [place]     Status: None   Collection Time: 09/08/19  4:46 PM  Result Value Ref Range   ABO/RH(D) O POS    Antibody Screen NEG    Sample Expiration 09/11/2019,2359    Unit Number [id number]    Blood Component Type RED CELLS,LR    Unit division 00    Status of Unit REL FROM [facility]    Transfusion Status OK TO TRANSFUSE    Crossmatch Result      Compatible Performed at [facility], [address] [place], [place] [address]    Unit Number [id number]    Blood Component Type RED CELLS,LR    Unit division 00    Status of Unit REL FROM [facility]     Transfusion Status OK TO TRANSFUSE    Crossmatch Result Compatible   Comprehensive metabolic panel     Status: Abnormal   Collection Time: 09/08/19  4:46 PM  Result Value Ref Range   Sodium 135 135 - 145 mmol/L   Potassium 3.3 (L) 3.5 - 5.1 mmol/L   Chloride 103 98 - 111 mmol/L   CO2 23 22 - 32 mmol/L   Glucose, Bld 97 70 - 99 mg/dL   BUN 12 6 - 20 mg/dL   Creatinine, Ser 0.49 0.44 - 1.00 mg/dL   Calcium 8.4 (L) 8.9 - 10.3 mg/dL   Total Protein 5.7 (L) 6.5 - 8.1 g/dL   Albumin 2.6 (L) 3.5 - 5.0 g/dL   AST 32 15 - 41 U/L   ALT 26 0 - 44 U/L   Alkaline Phosphatase 169 (H) 38 - 126 U/L   Total Bilirubin 0.5 0.3 - 1.2 mg/dL   GFR calc non Af Amer >60 >60 mL/min   GFR calc Af Amer >60 >60 mL/min   Anion gap 9 5 - 15    Comment: Performed at [facility], [address] [place], [place] [address]  RPR     Status: None   Collection Time: 09/08/19  4:46 PM  Result Value Ref Range   RPR Ser Ql NON REACTIVE NON REACTIVE    Comment: Performed at [facility], [place] [address] [place], [place] [address]  Protein / creatinine ratio, urine     Status: Abnormal   Collection Time: 09/08/19  4:46 PM  Result Value Ref Range   Creatinine, Urine 63 mg/dL   Total Protein, Urine 214 mg/dL    Comment: RESULT CONFIRMED BY MANUAL DILUTION KLW NO NORMAL RANGE ESTABLISHED FOR THIS TEST    Protein Creatinine Ratio 3.40 (H) 0.00 - 0.15 mg/mg[Cre]    Comment: Performed at [facility], [address] [place], [place] [address]  SARS Coronavirus 2 by RT PCR (hospital order, performed in [place] hospital lab) Nasopharyngeal Nasopharyngeal Swab     Status: None   Collection Time: 09/08/19  4:46 PM   Specimen: Nasopharyngeal Swab  Result Value Ref Range   SARS Coronavirus 2 NEGATIVE NEGATIVE    Comment: (NOTE) If result is NEGATIVE SARS-CoV-2 target nucleic acids are NOT DETECTED. The SARS-CoV-2 RNA is generally detectable in upper and lower  respiratory specimens  during the acute phase of infection. The lowest  concentration of SARS-CoV-2 viral copies this assay can detect is 250  copies / mL. A negative result does not preclude SARS-CoV-2 infection  and should not be used as the sole basis for treatment or other  patient management decisions.  A negative result may occur with  improper specimen collection / handling, submission of specimen other  than nasopharyngeal swab, presence of viral mutation(s) within the  areas targeted by this assay, and inadequate number of viral copies  (<250 copies / mL). A negative result must be combined with clinical  observations, patient history, and epidemiological information. If result is POSITIVE SARS-CoV-2 target nucleic acids are DETECTED. The SARS-CoV-2 RNA is generally detectable in upper and lower  respiratory specimens dur ing the acute phase of infection.  Positive  results are indicative of active infection with SARS-CoV-2.  Clinical  correlation with patient history and other diagnostic information is  necessary to determine patient infection status.  Positive results do  not rule out bacterial infection or co-infection with other viruses. If result is PRESUMPTIVE POSTIVE SARS-CoV-2 nucleic acids MAY BE PRESENT.   A presumptive positive result was obtained on the submitted specimen  and confirmed on repeat testing.  While 2019 novel coronavirus  (SARS-CoV-2) nucleic acids may be present in the submitted sample  additional confirmatory testing may be necessary for epidemiological  and / or clinical management purposes  to differentiate between  SARS-CoV-2 and other Sarbecovirus currently known to infect humans.  If clinically indicated additional testing with an alternate test  methodology [phone]) is advised. The SARS-CoV-2 RNA is generally  detectable in upper and lower respiratory sp ecimens during the acute  phase of infection. The expected result is Negative. Fact Sheet for Patients:   StrictlyIdeas.no Fact Sheet for Healthcare Providers: BankingDealers.co.za This test is not yet approved or cleared by the [place] FDA and has been authorized for detection and/or diagnosis of SARS-CoV-2 by FDA under an Emergency Use Authorization (EUA).  This EUA will remain in effect (meaning this test can be used) for the duration of the COVID-19 declaration under Section 564(b)(1) of the Act, 21 U.S.C. section 360bbb-3(b)(1), unless the authorization is terminated or revoked sooner. Performed at [facility], [address] [place], [place] [address]   Group B strep by PCR     Status: None  Collection Time: 09/08/19  6:24 PM   Specimen: Vaginal/Rectal; Genital  Result Value Ref Range   Group B strep by PCR NEGATIVE NEGATIVE    Comment: (NOTE) Intrapartum testing with Xpert GBS assay should be used as an adjunct to other methods available and not used to replace antepartum testing (at 35-[redacted] weeks gestation). Performed at [facility], [address] [place], [place] [address]   Prepare RBC     Status: None   Collection Time: 09/08/19 11:43 PM  Result Value Ref Range   Order Confirmation      ORDER PROCESSED BY BLOOD BANK Performed at [facility], [address] [place], [place] [address]   CBC with Differential/Platelet     Status: Abnormal   Collection Time: 09/09/19  2:58 AM  Result Value Ref Range   WBC 15.0 (H) 4.0 - 10.5 K/uL   RBC 4.38 3.87 - 5.11 MIL/uL   Hemoglobin 12.2 12.0 - 15.0 g/dL   HCT 37.1 36.0 - 46.0 %   MCV 84.7 80.0 - 100.0 fL   MCH 27.9 26.0 - 34.0 pg   MCHC 32.9 30.0 - 36.0 g/dL   RDW 13.9 11.5 - 15.5 %   Platelets 195 150 - 400 K/uL   nRBC 1.3 (H) 0.0 - 0.2 %   Neutrophils Relative % 72 %   Neutro Abs 10.8 (H) 1.7 - 7.7 K/uL   Lymphocytes Relative 20 %   Lymphs Abs 2.9 0.7 - 4.0 K/uL   Monocytes Relative 6 %   Monocytes Absolute 0.9 0.1 - 1.0 K/uL    Eosinophils Relative 0 %   Eosinophils Absolute 0.0 0.0 - 0.5 K/uL   Basophils Relative 0 %   Basophils Absolute 0.0 0.0 - 0.1 K/uL   Immature Granulocytes 2 %   Abs Immature Granulocytes 0.26 (H) 0.00 - 0.07 K/uL    Comment: Performed at [facility], [address] [place], [place] [address]  CBC     Status: Abnormal   Collection Time: 09/10/19  5:31 AM  Result Value Ref Range   WBC 14.0 (H) 4.0 - 10.5 K/uL   RBC 4.15 3.87 - 5.11 MIL/uL   Hemoglobin 11.7 (L) 12.0 - 15.0 g/dL   HCT 35.6 (L) 36.0 - 46.0 %   MCV 85.8 80.0 - 100.0 fL   MCH 28.2 26.0 - 34.0 pg   MCHC 32.9 30.0 - 36.0 g/dL   RDW 14.0 11.5 - 15.5 %   Platelets 179 150 - 400 K/uL   nRBC 0.5 (H) 0.0 - 0.2 %    Comment: Performed at [facility], [address] [place], [place] [address]     Assessment:  38 y.o. [id number] post-operative day #1 in stable condition  Plan:  1) Acute blood loss anemia - hemodynamically stable and asymptomatic - Continue prenatal vitamins with iron  2) Blood Type --/--/O POS (10/27 1646) / Rubella 8.61 (04/21 1439) / Varicella Immune  3) TDAP status: received on 12/26/2018  4) Breast and formula/Contraception: not discussed  5) Blood pressures remain elevated in the mild range. Has received dose of Norvasc 10 mg and IV anti-hypertensive medications are ordered as needed for severe range elevations.  6) Disposition: continue post-operative care.  [name], CNM 09/10/2019  9:47 AM

## 2019-09-10 NOTE — Lactation Note (Signed)
This note was copied from a baby's chart. Lactation Consultation Note  Patient Name: Toni Byrd [id number] Date: 09/10/2019 Reason for consult: Follow-up assessment  LC in to see mom before end of shift. Mom has pumped at 10:30 and 1:30 today and preparing for 4:30 pump. She is confident in her ability to assemble the parts/pieces, starting of the pump, and adjustment to the suction level as needed.  Mom believes she is starting to get some drops of colostrum, and follows up pumping with breast massage and compression.  LC reiterated the importance of routine and consistency with pumping, encouraged hands on pumping with breast massage and compression and warmth.  Mom plans for discharge on Saturday, and has a DEBP at home, given to her by her cousin and knows that she can get a pump through her insurance as well, and plans to continue with process of pumping post discharge.  Maternal Data    Feeding    LATCH Score                   Interventions Interventions: Breast feeding basics reviewed  Lactation Tools Discussed/Used     Consult Status Consult Status: Follow-up Date: 09/10/19 Follow-up type: In-patient    Toni Byrd 09/10/2019, 3:59 PM

## 2019-09-10 NOTE — Lactation Note (Signed)
This note was copied from a baby's chart. Lactation Consultation Note  Patient Name: Toni Byrd [id number] Date: 09/10/2019 Reason for consult: Follow-up assessment  LC checked in with mom this morning. Mom now on MBU after finishing Mag 2+. Mom began pumping yesterday around 10:30am, assisted by [facility] and continued with a semi-routine. This morning, mom reported last pump at 2am due to being tired. LC and [facility] intern assisted with parts assembly walking mom through the process again, starting and observing the beginning of the pumping session providing education on changes in suction as needed. Encouraged pumping routine of every 3 hours for today and overnight tonight, writing down schedule on the whiteboard. LC encouraged mom to call out with any questions or need for assistance today.  Maternal Data Formula Feeding for Exclusion: No Has patient been taught Hand Expression?: Yes Does the patient have breastfeeding experience prior to this delivery?: No  Feeding    LATCH Score                   Interventions Interventions: Breast feeding basics reviewed;DEBP  Lactation Tools Discussed/Used Tools: Pump Breast pump type: Double-Electric Breast Pump Pump Review: Setup, frequency, and cleaning;Milk Storage Initiated by:: Marcell Anger, MPH, IBCLC Date initiated:: 09/09/19   Consult Status Consult Status: Follow-up Date: 09/10/19 Follow-up type: In-patient    [name] 09/10/2019, 10:45 AM

## 2019-09-11 MED ORDER — SENNOSIDES-DOCUSATE SODIUM 8.6-50 MG PO TABS
2.0000 | ORAL_TABLET | ORAL | Status: DC
  Administered 2019-09-11: 2 via ORAL
  Filled 2019-09-11 (×2): qty 2

## 2019-09-11 MED ORDER — SIMETHICONE 80 MG PO CHEW
80.0000 mg | CHEWABLE_TABLET | ORAL | Status: DC
  Administered 2019-09-11: 80 mg via ORAL
  Filled 2019-09-11: qty 1

## 2019-09-11 NOTE — Lactation Note (Signed)
Lactation Consultation Note  Patient Name: Toni Byrd [id number] Date: 09/11/2019     Maternal Data  Mom states she has been pumping  every 3 hrs, beginning to see more colostrum when she pumps but not enough to collect.  She has a breast pump at home that was given to her by her niece but does not know what kind.  I requested that she have someone check to see what type and in the meantime please call her insurance co. To see if she can obtain an electric breast pump through insurance asap. She is for probable d/c tomorrow.  I informed her that she may need to rent a breast pump for d/c if she does not have a suitably efficient pump to use post discharge.  She has quesitons about storage of pumped milk and I gave her the breastfeeding education handout with the chart of breastmilk storage in it.  I also gave her the [employer] "Providing Breastmilk to your baby"  brochure.         Feeding    LATCH Score                   Interventions    Lactation Tools Discussed/Used     Consult Status      Ferol Luz 09/11/2019, 12:05 PM

## 2019-09-11 NOTE — Lactation Note (Signed)
Lactation Consultation Note  Patient Name: Toni Byrd [id number] Date: 09/11/2019  Mom given prices for breast pump rental and rental agreement to look over, she does not have a pump, her niece only gave her pump parts, and insurance states it will take 2 wks for her to get a pump so she will need to rent a pump in the meantime  Maternal Data    Feeding    LATCH Score                   Interventions    Lactation Tools Discussed/Used     Consult Status      Ferol Luz 09/11/2019, 6:35 PM

## 2019-09-11 NOTE — Progress Notes (Signed)
POD#2 primary low vertical C/S/pre-eclampsia with severe features Subjective:  Eating breakfast, is feeling well except for some incisional pain. Getting some relief with Tylenol and ibuprofen, prefers not to try oxycodone at this time. Tolerating a regular diet. Ambulating and voiding without problems.  Objective:  Blood pressure (!) 141/95, pulse 86, temperature 98.4 F (36.9 C), temperature source Oral, resp. rate 18, height 5\' 7"  (1.702 m), weight 96.6 kg, last menstrual period 01/23/2019, SpO2 98 %, currently breastfeeding.  General: NAD Pulmonary: no increased work of breathing Abdomen: non-distended, non-tender, fundus firm, lochia appropriate Incision: Dressing C/D/I Extremities: no edema, no erythema, no tenderness  Results for orders placed or performed during the hospital encounter of 09/08/19 (from the past 72 hour(s))  CBC     Status: Abnormal   Collection Time: 09/08/19  4:46 PM  Result Value Ref Range   WBC 15.5 (H) 4.0 - 10.5 K/uL   RBC 4.63 3.87 - 5.11 MIL/uL   Hemoglobin 12.9 12.0 - 15.0 g/dL   HCT 39.1 36.0 - 46.0 %   MCV 84.4 80.0 - 100.0 fL   MCH 27.9 26.0 - 34.0 pg   MCHC 33.0 30.0 - 36.0 g/dL   RDW 13.9 11.5 - 15.5 %   Platelets 186 150 - 400 K/uL   nRBC 1.7 (H) 0.0 - 0.2 %    Comment: Performed at [facility], [address] [place], [place] [address]  Type and screen [place]     Status: None   Collection Time: 09/08/19  4:46 PM  Result Value Ref Range   ABO/RH(D) O POS    Antibody Screen NEG    Sample Expiration 09/11/2019,2359    Unit Number [id number]    Blood Component Type RED CELLS,LR    Unit division 00    Status of Unit REL FROM [facility]    Transfusion Status OK TO TRANSFUSE    Crossmatch Result      Compatible Performed at [facility], [address] [place], [place] [address]    Unit Number [id number]    Blood Component Type RED CELLS,LR    Unit division 00    Status of Unit REL FROM  [facility]    Transfusion Status OK TO TRANSFUSE    Crossmatch Result Compatible   Comprehensive metabolic panel     Status: Abnormal   Collection Time: 09/08/19  4:46 PM  Result Value Ref Range   Sodium 135 135 - 145 mmol/L   Potassium 3.3 (L) 3.5 - 5.1 mmol/L   Chloride 103 98 - 111 mmol/L   CO2 23 22 - 32 mmol/L   Glucose, Bld 97 70 - 99 mg/dL   BUN 12 6 - 20 mg/dL   Creatinine, Ser 0.49 0.44 - 1.00 mg/dL   Calcium 8.4 (L) 8.9 - 10.3 mg/dL   Total Protein 5.7 (L) 6.5 - 8.1 g/dL   Albumin 2.6 (L) 3.5 - 5.0 g/dL   AST 32 15 - 41 U/L   ALT 26 0 - 44 U/L   Alkaline Phosphatase 169 (H) 38 - 126 U/L   Total Bilirubin 0.5 0.3 - 1.2 mg/dL   GFR calc non Af Amer >60 >60 mL/min   GFR calc Af Amer >60 >60 mL/min   Anion gap 9 5 - 15    Comment: Performed at [facility], [address], [place], [place] [address]  RPR     Status: None   Collection Time: 09/08/19  4:46 PM  Result Value  Ref Range   RPR Ser Ql NON REACTIVE NON REACTIVE    Comment: Performed at [facility] [address], [place], [place] [address]  Protein / creatinine ratio, urine     Status: Abnormal   Collection Time: 09/08/19  4:46 PM  Result Value Ref Range   Creatinine, Urine 63 mg/dL   Total Protein, Urine 214 mg/dL    Comment: RESULT CONFIRMED BY MANUAL DILUTION KLW NO NORMAL RANGE ESTABLISHED FOR THIS TEST    Protein Creatinine Ratio 3.40 (H) 0.00 - 0.15 mg/mg[Cre]    Comment: Performed at [facility], [address] [place], [place] [address]  SARS Coronavirus 2 by RT PCR (hospital order, performed in [place] hospital lab) Nasopharyngeal Nasopharyngeal Swab     Status: None   Collection Time: 09/08/19  4:46 PM   Specimen: Nasopharyngeal Swab  Result Value Ref Range   SARS Coronavirus 2 NEGATIVE NEGATIVE    Comment: (NOTE) If result is NEGATIVE SARS-CoV-2 target nucleic acids are NOT DETECTED. The SARS-CoV-2 RNA is generally detectable in upper and lower  respiratory  specimens during the acute phase of infection. The lowest  concentration of SARS-CoV-2 viral copies this assay can detect is 250  copies / mL. A negative result does not preclude SARS-CoV-2 infection  and should not be used as the sole basis for treatment or other  patient management decisions.  A negative result may occur with  improper specimen collection / handling, submission of specimen other  than nasopharyngeal swab, presence of viral mutation(s) within the  areas targeted by this assay, and inadequate number of viral copies  (<250 copies / mL). A negative result must be combined with clinical  observations, patient history, and epidemiological information. If result is POSITIVE SARS-CoV-2 target nucleic acids are DETECTED. The SARS-CoV-2 RNA is generally detectable in upper and lower  respiratory specimens dur ing the acute phase of infection.  Positive  results are indicative of active infection with SARS-CoV-2.  Clinical  correlation with patient history and other diagnostic information is  necessary to determine patient infection status.  Positive results do  not rule out bacterial infection or co-infection with other viruses. If result is PRESUMPTIVE POSTIVE SARS-CoV-2 nucleic acids MAY BE PRESENT.   A presumptive positive result was obtained on the submitted specimen  and confirmed on repeat testing.  While 2019 novel coronavirus  (SARS-CoV-2) nucleic acids may be present in the submitted sample  additional confirmatory testing may be necessary for epidemiological  and / or clinical management purposes  to differentiate between  SARS-CoV-2 and other Sarbecovirus currently known to infect humans.  If clinically indicated additional testing with an alternate test  methodology [phone]) is advised. The SARS-CoV-2 RNA is generally  detectable in upper and lower respiratory sp ecimens during the acute  phase of infection. The expected result is Negative. Fact Sheet for  Patients:  StrictlyIdeas.no Fact Sheet for Healthcare Providers: BankingDealers.co.za This test is not yet approved or cleared by the [place] FDA and has been authorized for detection and/or diagnosis of SARS-CoV-2 by FDA under an Emergency Use Authorization (EUA).  This EUA will remain in effect (meaning this test can be used) for the duration of the COVID-19 declaration under Section 564(b)(1) of the Act, 21 U.S.C. section 360bbb-3(b)(1), unless the authorization is terminated or revoked sooner. Performed at [facility], [address] [place], [place] [address]   Group B strep by PCR     Status: None   Collection  Time: 09/08/19  6:24 PM   Specimen: Vaginal/Rectal; Genital  Result Value Ref Range   Group B strep by PCR NEGATIVE NEGATIVE    Comment: (NOTE) Intrapartum testing with Xpert GBS assay should be used as an adjunct to other methods available and not used to replace antepartum testing (at 35-[redacted] weeks gestation). Performed at [facility], [address] [place], [place] [address]   Prepare RBC     Status: None   Collection Time: 09/08/19 11:43 PM  Result Value Ref Range   Order Confirmation      ORDER PROCESSED BY BLOOD BANK Performed at [facility], [address] [place], [place] [address]   CBC with Differential/Platelet     Status: Abnormal   Collection Time: 09/09/19  2:58 AM  Result Value Ref Range   WBC 15.0 (H) 4.0 - 10.5 K/uL   RBC 4.38 3.87 - 5.11 MIL/uL   Hemoglobin 12.2 12.0 - 15.0 g/dL   HCT 37.1 36.0 - 46.0 %   MCV 84.7 80.0 - 100.0 fL   MCH 27.9 26.0 - 34.0 pg   MCHC 32.9 30.0 - 36.0 g/dL   RDW 13.9 11.5 - 15.5 %   Platelets 195 150 - 400 K/uL   nRBC 1.3 (H) 0.0 - 0.2 %   Neutrophils Relative % 72 %   Neutro Abs 10.8 (H) 1.7 - 7.7 K/uL   Lymphocytes Relative 20 %   Lymphs Abs 2.9 0.7 - 4.0 K/uL   Monocytes Relative 6 %   Monocytes Absolute 0.9 0.1 - 1.0  K/uL   Eosinophils Relative 0 %   Eosinophils Absolute 0.0 0.0 - 0.5 K/uL   Basophils Relative 0 %   Basophils Absolute 0.0 0.0 - 0.1 K/uL   Immature Granulocytes 2 %   Abs Immature Granulocytes 0.26 (H) 0.00 - 0.07 K/uL    Comment: Performed at [facility], [address] [place], [place] [address]  Surgical pathology     Status: None   Collection Time: 09/09/19  5:38 AM  Result Value Ref Range   SURGICAL PATHOLOGY      SURGICAL PATHOLOGY CASE: [id number] PATIENT: [facility] Surgical Pathology Report     Specimen Submitted: A. Placenta  Clinical History: IUGR, preeclampsia with severe features, intermittent absent end diastolic flow on umbilical dopplers, category 2 tracing.  GA 32w 5d     DIAGNOSIS: A. THIRD TRIMESTER PLACENTA; CESAREAN SECTION: - VERY LOW PLACENTAL WEIGHT, LESS THAN THIRD PERCENTILE FOR GESTATIONAL AGE. - ACCELERATED VILLOUS MATURATION CONSISTENT WITH MATERNAL VASCULAR MALPERFUSION. - NO CHORIOAMNIONITIS OR VILLITIS.  Comment: The umbilical cord was not submitted, and the placenta appeared incomplete grossly, so the weight may be artifactually low. The placental hypoplasia and accelerated villous maturation are likely associated with the patient's preeclampsia. No infarcts are present. No definite decidual vasculopathy is identified. There are no histologic changes to suggest fetal vascular malperfusion.  GROSS DESCRIPTION: A. Labeled: Pla centa. Received: In formalin Weight: 215 grams Size: 13 x 9 x 4 cm Shape: Incomplete placental disc Accessory lobes: Not present Membranes: Incomplete small fragment of membrane measuring 4 x 2 x 0.1 cm Umbilical cord: Not received Fetal surface: Covered by pink smooth membrane Maternal surface: Partially distorted and disrupted.  Serial sections show beefy red spongy cut surfaces without visual or palpable abnormality  Block summary: 1 membranes entirely submitted 2-3 placental  disc 4 peripheral placenta.   Final Diagnosis performed by Bryan Lemma, MD.   Electronically signed 09/10/2019 4:58:54PM The electronic  signature indicates that the named Attending Pathologist has evaluated the specimen Technical component performed at [facility], [address] [place], [place] [address] Lab: [phone] Dir: Rush Farmer, MD, MMM  Professional component performed at [facility], [facility], [address], [place], [place] [address] Lab: [phone]  Dir: Dellia Nims. Rubinas, MD   CBC     Status: Abnormal   Collection Time: 09/10/19  5:31 AM  Result Value Ref Range   WBC 14.0 (H) 4.0 - 10.5 K/uL   RBC 4.15 3.87 - 5.11 MIL/uL   Hemoglobin 11.7 (L) 12.0 - 15.0 g/dL   HCT 35.6 (L) 36.0 - 46.0 %   MCV 85.8 80.0 - 100.0 fL   MCH 28.2 26.0 - 34.0 pg   MCHC 32.9 30.0 - 36.0 g/dL   RDW 14.0 11.5 - 15.5 %   Platelets 179 150 - 400 K/uL   nRBC 0.5 (H) 0.0 - 0.2 %    Comment: Performed at [facility], [address] [place], [place] [address]     Assessment:  38 y.o. [phone] post-operative day #2 in good condition  Plan:  1) Acute blood loss anemia - hemodynamically stable and asymptomatic - Continue prenatal vitamins with iron  2) Blood Type --/--/O POS (10/27 1646) / Rubella 8.61 (04/21 1439) / Varicella Immune  3) TDAP status: received antepartum 12/26/2018  4) Breastfeeding: feeling discouraged because she had been getting drops of colostrum and then nothing. Encouraged pumping on schedule with massage/hands-on pumping techniques. Lactation is assisting her.  5) Blood pressures: Improving. Some mild range and some normotensive, continue Norvasc 10 mg.  6) Disposition: continue post-operative care.  [name], CNM 09/11/2019  10:18 AM

## 2019-09-11 NOTE — Anesthesia Postprocedure Evaluation (Signed)
Anesthesia Post Note  Patient: DASIYA HELDER  Procedure(s) Performed: CESAREAN SECTION (N/A )  Patient location during evaluation: Mother Baby Anesthesia Type: Spinal Level of consciousness: awake and alert and oriented Pain management: pain level controlled Vital Signs Assessment: post-procedure vital signs reviewed and stable Respiratory status: spontaneous breathing Cardiovascular status: stable Postop Assessment: no headache, no backache, no apparent nausea or vomiting, patient able to bend at knees, adequate PO intake and able to ambulate Anesthetic complications: no     Last Vitals:  Vitals:   09/11/19 0300 09/11/19 0734  BP: (!) 142/84 (!) 141/95  Pulse: 97 86  Resp: 17 18  Temp: 36.8 C 36.9 C  SpO2: 98% 98%    Last Pain:  Vitals:   09/11/19 0734  TempSrc: Oral  PainSc:                  Lanora Manis

## 2019-09-12 MED ORDER — DOCUSATE SODIUM 100 MG PO CAPS: 100.0000 mg | ORAL_CAPSULE | Freq: Two times a day (BID) | ORAL | 0 refills | Status: DC

## 2019-09-12 MED ORDER — ACETAMINOPHEN 500 MG PO TABS: 1000.0000 mg | ORAL_TABLET | Freq: Four times a day (QID) | ORAL | 0 refills | Status: DC

## 2019-09-12 MED ORDER — BISACODYL 10 MG RE SUPP: 10.0000 mg | Freq: Every day | RECTAL | 0 refills | Status: DC | PRN

## 2019-09-12 MED ORDER — AMLODIPINE BESYLATE 10 MG PO TABS: 10.0000 mg | ORAL_TABLET | Freq: Every day | ORAL | 3 refills | Status: DC

## 2019-09-12 MED ORDER — TRAMADOL HCL 50 MG PO TABS: 50.0000 mg | ORAL_TABLET | Freq: Four times a day (QID) | ORAL | 0 refills | Status: DC | PRN

## 2019-09-12 MED ORDER — IBUPROFEN 600 MG PO TABS: 600.0000 mg | ORAL_TABLET | Freq: Four times a day (QID) | ORAL | 0 refills | Status: DC

## 2019-09-12 NOTE — Lactation Note (Addendum)
This note was copied from a baby's chart. Lactation Consultation Note  Patient Name: Boy Anna-Marie Coller OILNZ'V Date: 09/12/2019   Mom to be discharged home today without [name] Gave.  Mom has [employer], but will not be able to get DEBP through insurance for 2 weeks.  Mom has decided to rent [employer] pump for 1 month.  DEBP kit also left at [facility] bedside for mom to pump when visiting.  Praised mom for her commitment to continue to pump to supply her breast milk for [name].  Reviewed hand expression, pumping, collection, storage, cleaning, labeling and supply and demand and encouraged to continue to pump 8 or more times in 24 hours or every 2 to 3 hours to bring in mature milk and ensure a plentiful milk supply.  Mom is expressing around 2 to 5 ml which she is taking to [name] in [facility].  Community lactation resources given with contact numbers and encouraged to call with any questions, concerns or assistance.  Maternal Data    Feeding Feeding Type: Breast Milk(maternal milk and donor milk)  LATCH Score                   Interventions    Lactation Tools Discussed/Used Tools: 74F feeding tube / Syringe;Pump   Consult Status      [name] 09/12/2019, 5:32 PM

## 2019-09-12 NOTE — Discharge Instructions (Signed)
Discharge Instructions:   Follow-up Appointment: 1 week, please call the office to schedule  If there are any new medications, they have been ordered and will be available for pickup at the listed pharmacy on your way home from the hospital.   Call the office if you have any of the following: headache, visual changes, fever >101.0 F, chills, shortness of breath, breast concerns, excessive vaginal bleeding, incision drainage or problems, leg pain or redness, depression or any other concerns. If you have vaginal discharge with an odor, let your doctor know.   It is normal to bleed for up to 6 weeks. You should not soak through more than 1 pad in 1 hour. If you have a blood clot larger than your fist with continued bleeding, call your doctor.   After a c-section, you should expect a small amount of blood or clear fluid coming from the incision and abdominal cramping/soreness. Inspect your incision site daily. Stand in front of a mirror to look for any redness, incision opening, or discolored/odorness drainage. Take a shower daily and continue good hygiene. Use own towel and washcloth (do not share). Make sure your sheets on your bed are clean. No pets sleeping around your incision site. Dressing will be removed at your postpartum visit. If the dressing does become wet or soiled underneath, it is okay to remove it before your visit.    On-Q pump: You will remove on day 4 after insertion or if the ball becomes flat before day 4. You will remove on: 09/13/2019  Activity: Do not lift > 15 lbs for 6 weeks (do not lift anything heavier than your baby). No intercourse, tampons, swimming pools, hot tubs, baths (only showers) for 6 weeks.  No driving for 1-2 weeks. Do not drive while taking narcotic or opioid pain medication.  Continue taking your prenatal vitamin, especially if breastfeeding. Increase calories and fluids (water) while breastfeeding.   Your milk will come in, in the next couple of days (right  now it is colostrum). You may have a slight fever when your milk comes in, but it should go away on its own.  If it does not, and rises above 101 F please call the doctor. You will also feel achy and your breasts will be firm. They will also start to leak. If you are breastfeeding, continue as you have been and you can pump/express milk for comfort.   If you have too much milk, your breasts can become engorged, which could lead to mastitis. This is an infection of the milk ducts. It can be very painful and you will need to notify your doctor to obtain a prescription for antibiotics. You can also treat it with a shower or hot/cold compress.   For concerns about your baby, please call your pediatrician.  For breastfeeding concerns, the lactation consultant can be reached at [phone].   Postpartum blues (feelings of happy one minute and sad another minute) are normal for the first few weeks but if it gets worse let your doctor know.   Congratulations! We enjoyed caring for you and your new bundle of joy!     Cesarean Delivery, Care After This sheet gives you information about how to care for yourself after your procedure. Your health care provider may also give you more specific instructions. If you have problems or questions, contact your health care provider. What can I expect after the procedure? After the procedure, it is common to have:  A small amount of blood or  clear fluid coming from the incision.  Some redness, swelling, and pain in your incision area.  Some abdominal pain and soreness.  Vaginal bleeding (lochia). Even though you did not have a vaginal delivery, you will still have vaginal bleeding and discharge.  Pelvic cramps.  Fatigue. You may have pain, swelling, and discomfort in the tissue between your vagina and your anus (perineum) if:  Your C-section was unplanned, and you were allowed to labor and push.  An incision was made in the area (episiotomy) or the tissue  tore during attempted vaginal delivery. Follow these instructions at home: Incision care   Follow instructions from your health care provider about how to take care of your incision. Make sure you: ? Wash your hands with soap and water before you change your bandage (dressing). If soap and water are not available, use hand sanitizer. ? If you have a dressing, change it or remove it as told by your health care provider. ? Leave stitches (sutures), skin staples, skin glue, or adhesive strips in place. These skin closures may need to stay in place for 2 weeks or longer. If adhesive strip edges start to loosen and curl up, you may trim the loose edges. Do not remove adhesive strips completely unless your health care provider tells you to do that.  Check your incision area every day for signs of infection. Check for: ? More redness, swelling, or pain. ? More fluid or blood. ? Warmth. ? Pus or a bad smell.  Do not take baths, swim, or use a hot tub until your health care provider says it's okay. Ask your health care provider if you can take showers.  When you cough or sneeze, hug a pillow. This helps with pain and decreases the chance of your incision opening up (dehiscing). Do this until your incision heals. Medicines  Take over-the-counter and prescription medicines only as told by your health care provider.  If you were prescribed an antibiotic medicine, take it as told by your health care provider. Do not stop taking the antibiotic even if you start to feel better.  Do not drive or use heavy machinery while taking prescription pain medicine. Lifestyle  Do not drink alcohol. This is especially important if you are breastfeeding or taking pain medicine.  Do not use any products that contain nicotine or tobacco, such as cigarettes, e-cigarettes, and chewing tobacco. If you need help quitting, ask your health care provider. Eating and drinking  Drink at least 8 eight-ounce glasses of water  every day unless told not to by your health care provider. If you breastfeed, you may need to drink even more water.  Eat high-fiber foods every day. These foods may help prevent or relieve constipation. High-fiber foods include: ? Whole grain cereals and breads. ? Brown rice. ? Beans. ? Fresh fruits and vegetables. Activity   If possible, have someone help you care for your baby and help with household activities for at least a few days after you leave the hospital.  Return to your normal activities as told by your health care provider. Ask your health care provider what activities are safe for you.  Rest as much as possible. Try to rest or take a nap while your baby is sleeping.  Do not lift anything that is heavier than 10 lbs (4.5 kg), or the limit that you were told, until your health care provider says that it is safe.  Talk with your health care provider about when you can engage  in sexual activity. This may depend on your: ? Risk of infection. ? How fast you heal. ? Comfort and desire to engage in sexual activity. General instructions  Do not use tampons or douches until your health care provider approves.  Wear loose, comfortable clothing and a supportive and well-fitting bra.  Keep your perineum clean and dry. Wipe from front to back when you use the toilet.  If you pass a blood clot, save it and call your health care provider to discuss. Do not flush blood clots down the toilet before you get instructions from your health care provider.  Keep all follow-up visits for you and your baby as told by your health care provider. This is important. Contact a health care provider if:  You have: ? A fever. ? Bad-smelling vaginal discharge. ? Pus or a bad smell coming from your incision. ? Difficulty or pain when urinating. ? A sudden increase or decrease in the frequency of your bowel movements. ? More redness, swelling, or pain around your incision. ? More fluid or blood  coming from your incision. ? A rash. ? Nausea. ? Little or no interest in activities you used to enjoy. ? Questions about caring for yourself or your baby.  Your incision feels warm to the touch.  Your breasts turn red or become painful or hard.  You feel unusually sad or worried.  You vomit.  You pass a blood clot from your vagina.  You urinate more than usual.  You are dizzy or light-headed. Get help right away if:  You have: ? Pain that does not go away or get better with medicine. ? Chest pain. ? Difficulty breathing. ? Blurred vision or spots in your vision. ? Thoughts about hurting yourself or your baby. ? New pain in your abdomen or in one of your legs. ? A severe headache.  You faint.  You bleed from your vagina so much that you fill more than one sanitary pad in one hour. Bleeding should not be heavier than your heaviest period. Summary  After the procedure, it is common to have pain at your incision site, abdominal cramping, and slight bleeding from your vagina.  Check your incision area every day for signs of infection.  Tell your health care provider about any unusual symptoms.  Keep all follow-up visits for you and your baby as told by your health care provider. This information is not intended to replace advice given to you by your health care provider. Make sure you discuss any questions you have with your health care provider. Document Released: 07/21/2002 Document Revised: 05/07/2018 Document Reviewed: 05/07/2018 Elsevier Patient Education  2020 [employer].

## 2019-09-12 NOTE — Progress Notes (Signed)
Admit Date: 09/08/2019 Today's Date: 09/12/2019  Post Partum Day 3  Subjective:  no complaints, up ad lib, voiding, tolerating PO, + flatus and + bowel movement  Denies headache and vision changes  Objective: Temp:  [97.9 F (36.6 C)-98.9 F (37.2 C)] 98.3 F (36.8 C) (10/31 0841) Pulse Rate:  [88-99] 88 (10/31 0841) Resp:  [18-20] 18 (10/31 0841) BP: (133-142)/(86-93) 142/90 (10/31 0841) SpO2:  [95 %-100 %] 100 % (10/31 0841)  Physical Exam:  General: alert, cooperative and appears stated age 38: appropriate Uterine Fundus: firm Incision: healing well, no significant drainage, no dehiscence DVT Evaluation: No evidence of DVT seen on physical exam.  Recent Labs    09/10/19 0531  HGB 11.7*  HCT 35.6*    Assessment/Plan:  1) Blood Type --/--/O POS (10/27 1646) / Rubella 8.61 (04/21 1439) / Varicella Immune  2) TDAP status: received antepartum 12/26/2018  3) Breastfeeding:  Encouraged pumping on schedule with massage/hands-on pumping techniques. Lactation is assisting her. She has produced colostrum today.  5) Blood pressures: Moderate range BP, continue Norvasc 10 mg.  6) Disposition:home today.   LOS: 4 days   [place] 09/12/2019, 9:56 AM

## 2019-09-12 NOTE — Progress Notes (Signed)
DC instr reviewed with pt and her mother.  Verb u/o.  Pt in SCN seeing baby before discharge.

## 2019-09-14 ENCOUNTER — Ambulatory Visit: Payer: Self-pay

## 2019-09-14 NOTE — Lactation Note (Signed)
This note was copied from a baby's chart. Lactation Consultation Note  Patient Name: Toni Byrd [id number] Date: 09/14/2019   Mom came in today and brought in larger volumes of mature milk.  Praised mom for commitment to provide breast milk for her 32.5 week baby in SCN.  Mom has rented Symphony pump from [facility] lactation.  Mom will receive her DEBP through her [facility] insurance in 2 weeks but wanted to rent our pump for 1 month.  [name] Gave is getting mom's expressed breast milk via NG tube.  Maternal Data    Feeding Feeding Type: Breast Milk  LATCH Score                   Interventions    Lactation Tools Discussed/Used     Consult Status      [name] 09/14/2019, 6:55 PM

## 2019-09-16 ENCOUNTER — Ambulatory Visit (INDEPENDENT_AMBULATORY_CARE_PROVIDER_SITE_OTHER): Payer: 59 | Admitting: Obstetrics and Gynecology

## 2019-09-16 ENCOUNTER — Encounter: Payer: Self-pay | Admitting: Obstetrics and Gynecology

## 2019-09-16 ENCOUNTER — Other Ambulatory Visit: Payer: Self-pay

## 2019-09-16 NOTE — Progress Notes (Signed)
  OBSTETRICS POSTPARTUM CLINIC PROGRESS NOTE  Subjective:     Toni Byrd is a 38 y.o. [phone] female who presents for a postpartum visit. She is 1 week postpartum following a Term pregnancy and delivery by C-section.  I have fully reviewed the prenatal and intrapartum course. Anesthesia: spinal.  Postpartum course has been complicated by complicated by hypertension.  Baby is feeding by Breast.  Bleeding: patient has not  resumed menses.  Bowel function is normal. Bladder function is normal.  Patient is not sexually active. Contraception method desired is condoms.  Postpartum depression screening: negative.  The following portions of the patient's history were reviewed and updated as appropriate: allergies, current medications, past family history, past medical history, past social history, past surgical history and problem list.  Review of Systems Pertinent items are noted in HPI.  Objective:    Ht 5\' 7"  (1.702 m)   Wt 204 lb (92.5 kg)   LMP 01/23/2019 (Exact Date)   Breastfeeding Yes   BMI 31.95 kg/m   General:  alert and no distress   Breasts:  inspection negative, no nipple discharge or bleeding, no masses or nodularity palpable  Lungs: clear to auscultation bilaterally  Heart:  regular rate and rhythm, S1, S2 normal, no murmur, click, rub or gallop  Abdomen: soft, non-tender; bowel sounds normal; no masses,  no organomegaly.   Well healed Pfannenstiel incision   Vulva:  normal  Vagina: normal vagina, no discharge, exudate, lesion, or erythema  Cervix:  no cervical motion tenderness and no lesions  Corpus: normal size, contour, position, consistency, mobility, non-tender  Adnexa:  normal adnexa and no mass, fullness, tenderness  Rectal Exam: Not performed.          Assessment:  Post Partum Care visit There are no diagnoses linked to this encounter.  Plan:  See orders and Patient Instructions Follow up in: 2 weeks or as needed.   Continue Norvasc 10mg  q day-  follow up in 2 weeks.   Adrian Prows MD [facility], [facility] 09/16/2019 1:47 PM

## 2019-09-21 ENCOUNTER — Ambulatory Visit: Payer: Self-pay

## 2019-09-21 NOTE — Lactation Note (Signed)
This note was copied from a baby's chart. Lactation Consultation Note  Patient Name: Toni Byrd [id number] Date: 09/21/2019   [name] allowed to have lick and learn session today.  Mom had pumped an hour before.  We put him to the breast that produced less in modified cradle hold skin to skin with good pillow support.  He was rooting for the breast.  Hand expressed a couple of drops which he licked.  He opened his mouth wide and latched with a shallow latch.  He took a few weak sucks with a couple of audible swallows.  He mostly just kept his mouth on the breast.  We hand expressed a few more drops.  He gagged a little and spit out one tiny undigested drop of milk.  There was no drop in heart rate, respiratory rate or oxygen saturation during entire lick and learn session, even when he gagged and spit.  Once he gagged we immediately took him off the breast, but let him remain skin to skin with mom.  Mom rented Symphony pump from here for 1 month.  She plans to get a DEBP through her [facility] insurance in a couple of weeks.  Mom has been committed to pumping every 2 to 3 hours and is expressing enough milk, around 2 oz, to have more than baby's demands for now.  Lactation numbers given and encouraged mom to call with any questions, concerns or if wanted assistance again.      Maternal Data    Feeding Feeding Type: Breast Milk  LATCH Score                   Interventions    Lactation Tools Discussed/Used Tools: Pump;109F feeding tube / Syringe   Consult Status      [name] 09/21/2019, 9:49 PM

## 2019-09-22 ENCOUNTER — Ambulatory Visit: Payer: Self-pay

## 2019-09-22 NOTE — Lactation Note (Signed)
This note was copied from a baby's chart. Lactation Consultation Note  Patient Name: Toni Byrd [id number] Date: 09/22/2019 Reason for consult: Follow-up assessment;Preterm <34wks;1st time breastfeeding  [facility] team called into assist with mom and [name]'s second breastfeeding/lick and learn attempt. Baby was skin to skin with mom, RN finished hooking up the 3pm feed. Mom last expressed milk around 1:30pm, in hopes of a successful attempt today, reporting that baby did really well yesterday. [place] assisted with support pillows and helping mom bring baby into cross-cradle position. Mom appeared comfortable in support baby's body and head, and had a pillowed placed under her arms for support. LC encouraged mom to hand express some milk for encouragement, milk evident with little stimulation. Multiple attempts made in efforts to sustain a latch, only achieving a suck 3-5 times, no sustained latch. Baby did appear a little agitated with movement, and would push away from the breast with all attempts. Mom moved baby up to skin to skin position, and baby settled nicely. LC encouraged mom to continue with current pumping efforts; routine and consistency with building a milk supply, benefits of skin to skin time, continuation of lick and learn, opportunities for improvement as baby grows, and encouragement of pumping bedside to provide baby with breastmilk. Mom understands the learning process for both her and baby and will continue as she has been.  Maternal Data Formula Feeding for Exclusion: No Has patient been taught Hand Expression?: Yes Does the patient have breastfeeding experience prior to this delivery?: No  Feeding    LATCH Score Latch: Repeated attempts needed to sustain latch, nipple held in mouth throughout feeding, stimulation needed to elicit sucking reflex.  Audible Swallowing: None  Type of Nipple: Everted at rest and after stimulation  Comfort (Breast/Nipple): Soft /  non-tender  Hold (Positioning): Assistance needed to correctly position infant at breast and maintain latch.  LATCH Score: 6  Interventions Interventions: Breast feeding basics reviewed;Assisted with latch;Skin to skin;Hand express;Adjust position;Support pillows;Position options  Lactation Tools Discussed/Used     Consult Status Consult Status: PRN Date: 09/22/19 Follow-up type: Call as needed    [name] 09/22/2019, 4:06 PM

## 2019-09-28 ENCOUNTER — Ambulatory Visit (INDEPENDENT_AMBULATORY_CARE_PROVIDER_SITE_OTHER): Payer: 59 | Admitting: Obstetrics and Gynecology

## 2019-09-28 ENCOUNTER — Encounter: Payer: Self-pay | Admitting: Obstetrics and Gynecology

## 2019-09-28 ENCOUNTER — Other Ambulatory Visit: Payer: Self-pay

## 2019-09-28 DIAGNOSIS — O1493 Unspecified pre-eclampsia, third trimester: Secondary | ICD-10-CM

## 2019-09-28 NOTE — Progress Notes (Signed)
  OBSTETRICS POSTPARTUM CLINIC PROGRESS NOTE  Subjective:     Toni Byrd is a 38 y.o. [phone] female who presents for a postpartum visit. She is 2 weeks postpartum following a Preterm pregnancy <37 weeks and delivery by C-section.  I have fully reviewed the prenatal and intrapartum course. Anesthesia: spinal.  Postpartum course has been complicated by complicated by pre-eclampsia.  Baby is feeding by Breast.  Bleeding: patient has not  resumed menses.  Bowel function is normal. Bladder function is normal.  Patient is not sexually active. Contraception method desired is none.  Postpartum depression screening: negative.  The following portions of the patient's history were reviewed and updated as appropriate: allergies, current medications, past family history, past medical history, past social history, past surgical history and problem list.  Review of Systems Pertinent items are noted in HPI.  Objective:    BP 110/70   Ht 5\' 7"  (1.702 m)   Wt 199 lb (90.3 kg)   LMP 01/23/2019 (Exact Date)   Breastfeeding Yes   BMI 31.17 kg/m   General:  alert and no distress   Breasts:  inspection negative, no nipple discharge or bleeding, no masses or nodularity palpable  Lungs: clear to auscultation bilaterally  Heart:  regular rate and rhythm, S1, S2 normal, no murmur, click, rub or gallop  Abdomen: soft, non-tender; bowel sounds normal; no masses,  no organomegaly.   Well healed Pfannenstiel incision   Vulva:  normal  Vagina: normal vagina, no discharge, exudate, lesion, or erythema  Cervix:  no cervical motion tenderness and no lesions  Corpus: normal size, contour, position, consistency, mobility, non-tender  Adnexa:  normal adnexa and no mass, fullness, tenderness  Rectal Exam: Not performed.          Assessment:  Post Partum Care visit There are no diagnoses linked to this encounter.  Plan:  See orders and Patient Instructions Follow up in: 3 weeks or as needed.    Discussed decreasing of stopping HTN medication if she becomes dizzy or light headed.  Is taking BP at home and mostly 120/80.  Will send mychart message if she is having problems.  Incision is healing well.   Baby has been doing well in NICU without issues, "just needs to gain weight"  Adrian Prows MD [place] 09/28/2019 11:00 AM

## 2019-10-01 ENCOUNTER — Ambulatory Visit: Payer: Self-pay

## 2019-10-01 NOTE — Lactation Note (Signed)
This note was copied from a baby's chart. Lactation Consultation Note  Patient Name: Toni Byrd [id number] Date: 10/01/2019 Reason for consult: Follow-up assessment;Mother's request;1st time breastfeeding;Preterm <34wks  LC called to baby's bedside as mom was beginning skin to skin. Mom has been pumping routinely and providing EBM for baby's tube feedings, and desires to direct breastfeed. MD has provided guidance that lick and learns are possible at this time. Baby's length of feeds has decreased from a timespan of 2 hours to 90 minutes. Longer feeds were given due to baby spitting up regularly with quicker feeds. Lick and learns are important to help baby establish connection between breast and stomach filling, with lick and learn performed on a empty breast or semi-empty breast at this time.  LC assisted in providing support pillows for mom, educated on purpose of lick and learn today, and observation of baby's behavior, body language, and stats. Baby was moved into cross cradle and nose placed across from nipple. Baby did began nuzzling the breast, licking at the areola and breast tissue, with a few instances of bobbing his head. LC did tea-cup hold with mom's breast tissue and nipple and baby was able to open mouth wide, nipple in mouth, with no sucking, but licking continued. Baby began to stool, and pass gas, becoming slightly irritable and uncomfortable, and was just placed with face/cheek resting on mom's breast. LC praised mom for her breastfeeding efforts, outcome of lick and learn today, and encouraged to continue with pumping routine and providing her own EBM.   Maternal Data Formula Feeding for Exclusion: No Has patient been taught Hand Expression?: Yes Does the patient have breastfeeding experience prior to this delivery?: No  Feeding Feeding Type: Breast Milk  LATCH Score Latch: Too sleepy or reluctant, no latch achieved, no sucking elicited.  Audible Swallowing:  None  Type of Nipple: Everted at rest and after stimulation  Comfort (Breast/Nipple): Soft / non-tender  Hold (Positioning): Assistance needed to correctly position infant at breast and maintain latch.  LATCH Score: 5  Interventions Interventions: Breast feeding basics reviewed;Assisted with latch;Skin to skin;Breast compression;Adjust position;Support pillows;Position options;DEBP  Lactation Tools Discussed/Used Tools: Pump   Consult Status Consult Status: PRN Date: 10/01/19 Follow-up type: Call as needed    [name] 10/01/2019, 1:26 PM

## 2019-10-05 ENCOUNTER — Ambulatory Visit: Payer: Self-pay

## 2019-10-05 NOTE — Lactation Note (Signed)
This note was copied from a baby's chart. Lactation Consultation Note  Patient Name: Toni Byrd [id number] Date: 10/05/2019 Reason for consult: Follow-up assessment;Mother's request;Primapara;1st time breastfeeding;NICU baby;Preterm <34wks;Infant < 6lbs Assisted mom in comfortable position with pillow support and breast feeding stool in cradle hold skin to skin on right breast.  [name] Gave latched well after a few attempts and began strong rhythmic sucking that was felt by mom as strong tugs on the breast while he was receiving his tube feeding.  He sustained the latch and sucked for 25 minutes with audible swallows without bradycardia or apneic spells and O2 SATS remained WNL.  Breast was softer after breast feed.  He choked once and was taking off the breast for short interval, but recovered quickly.  Left [name] skin to skin on mom's chest after breast feed.  Lactation has been assisting with lick and learn sessions on empty breast.  Mom had not pumped in 3 hours before this breast feed and breasts were full.  Breast was significantly softer after breast feeding.  Encouraged mom to call for lactation assistance whenever she breast fed.    Maternal Data Formula Feeding for Exclusion: No Has patient been taught Hand Expression?: Yes Does the patient have breastfeeding experience prior to this delivery?: No(P1)  Feeding Feeding Type: Breast Milk  LATCH Score Latch: Repeated attempts needed to sustain latch, nipple held in mouth throughout feeding, stimulation needed to elicit sucking reflex.  Audible Swallowing: Spontaneous and intermittent  Type of Nipple: Everted at rest and after stimulation  Comfort (Breast/Nipple): Filling, red/small blisters or bruises, mild/mod discomfort  Hold (Positioning): Assistance needed to correctly position infant at breast and maintain latch.  LATCH Score: 7  Interventions Interventions: Breast feeding basics reviewed;Assisted with  latch;Skin to skin;Breast massage;Hand express;Breast compression;Adjust position;Support pillows;Position options  Lactation Tools Discussed/Used WIC Program: [employer]) Pump Review: Setup, frequency, and cleaning;Milk Storage;Other (comment) Initiated by:: Gerome Sam, MPH, IBCLC Date initiated:: 09/09/19   Consult Status Consult Status: Follow-up Follow-up type: Call as needed    [name] 10/05/2019, 6:55 PM

## 2019-10-07 ENCOUNTER — Ambulatory Visit: Payer: Self-pay

## 2019-10-07 NOTE — Lactation Note (Signed)
This note was copied from a baby's chart. Lactation Consultation Note  Patient Name: Toni Byrd [id number] Date: 10/07/2019    [facility] called in to assist with [name]'s 3pm feeding. LC told that baby can feed ad lib at breast then followed by tube feeding. Last successful latch with duration of 25 minutes was on Sunday, 10/04/2019. Nursing staff attempting a latch at feeding yesterday, LC was unavailable, and mom reported that baby didn't seem interested. LC assisted mom with putting baby in cradle position to the right breast. Mom had expressed drops of milk onto the nipple to help encourage baby to latch. [name] seemed irritated/agitated after being placed in cradle position, multiple attempts at latching were unsuccessful. [name] willingly latched onto LC's finger, and had strong rhythmic sucking pattern immediately. [facility] intern assisted in placing a nipple shield to mom's breast in position appropriate for cradle position, [name] worked slowly to transition [name] from finger to nipple shield. [name] did latch to nipple shield, but for 2-3 sucks before again becoming agitated, and VS increasing. Baby placed skin to skin for calming. After approximately 5 minutes mom wanted to attempt again, this time without the nipple shield. Mom brought baby into cradle position with some support by LC, [name] did lick around the breast and nipple tissue, opened wide, but would not close around the nipple. LC attempted a tea cup hold, and assisted mom with bringing baby in close to the breast, and [name] began to push back against the breast, with VS increasing once again. Baby placed again skin to skin with mom for calming, warm blanket placed over them both.  [place] communicated with RN, RN began to prep feeding.    Maternal Data    Feeding Feeding Type: Breast Milk Nipple Type: Slow - flow  LATCH Score                   Interventions    Lactation Tools Discussed/Used Tools: Bottle;61F feeding tube /  Syringe;Pump   Consult Status      [name] 10/07/2019, 3:21 PM

## 2019-10-09 ENCOUNTER — Ambulatory Visit: Payer: Self-pay

## 2019-10-09 NOTE — Lactation Note (Signed)
This note was copied from a baby's chart. Mom states she is continuing to pump breasts every 2-3 hrs and is bringing in the milk to SCN, feels she is doing well with pumping, she has baby skin to skin at present after having baby at breast for last feed.

## 2019-10-20 ENCOUNTER — Other Ambulatory Visit: Payer: Self-pay

## 2019-10-20 ENCOUNTER — Ambulatory Visit (INDEPENDENT_AMBULATORY_CARE_PROVIDER_SITE_OTHER): Payer: 59 | Admitting: Obstetrics and Gynecology

## 2019-10-20 ENCOUNTER — Other Ambulatory Visit (HOSPITAL_COMMUNITY)
Admission: RE | Admit: 2019-10-20 | Discharge: 2019-10-20 | Disposition: A | Discharge status: Home / Self Care | Payer: 59 | Source: Ambulatory Visit | Attending: Obstetrics and Gynecology | Admitting: Obstetrics and Gynecology

## 2019-10-20 ENCOUNTER — Encounter: Payer: Self-pay | Admitting: Obstetrics and Gynecology

## 2019-10-20 VITALS — BP 104/60 | HR 89 | Ht 67.0 in | Wt 201.0 lb

## 2019-10-20 DIAGNOSIS — Z124 Encounter for screening for malignant neoplasm of cervix: Secondary | ICD-10-CM | POA: Insufficient documentation

## 2019-10-20 DIAGNOSIS — Z1389 Encounter for screening for other disorder: Secondary | ICD-10-CM | POA: Diagnosis not present

## 2019-10-20 NOTE — Progress Notes (Signed)
  OBSTETRICS POSTPARTUM CLINIC PROGRESS NOTE  Subjective:     Toni Byrd is a 38 y.o. [phone] female who presents for a postpartum visit. She is 6 weeks postpartum following a Preterm pregnancy <37 weeks and delivery by C-section.  I have fully reviewed the prenatal and intrapartum course. Anesthesia: spinal.  Postpartum course has been complicated by complicated by pre-eclampsia.  Baby is feeding by Bottle and Breast.  Bleeding: patient has not  resumed menses.  Bowel function is normal. Bladder function is normal.  Patient is not sexually active. Contraception method desired is condoms.  Postpartum depression screening: negative. [place] 0.  The following portions of the patient's history were reviewed and updated as appropriate: allergies, current medications, past family history, past medical history, past social history, past surgical history and problem list.  Review of Systems Pertinent items are noted in HPI.  Objective:    BP 104/60   Pulse 89   Ht 5\' 7"  (1.702 m)   Wt 201 lb (91.2 kg)   LMP 01/23/2019 (Exact Date)   Breastfeeding Yes   BMI 31.48 kg/m   General:  alert and no distress   Breasts:  inspection negative, no nipple discharge or bleeding, no masses or nodularity palpable  Lungs: clear to auscultation bilaterally  Heart:  regular rate and rhythm, S1, S2 normal, no murmur, click, rub or gallop  Abdomen: soft, non-tender; bowel sounds normal; no masses,  no organomegaly.   Well healed Pfannenstiel incision   Vulva:  normal  Vagina: normal vagina, no discharge, exudate, lesion, or erythema  Cervix:  no cervical motion tenderness and no lesions  Corpus: normal size, contour, position, consistency, mobility, non-tender  Adnexa:  normal adnexa and no mass, fullness, tenderness  Rectal Exam: Not performed.          Assessment:  Post Partum Care visit 1. Cervical cancer screening   - Cytology - PAP  2. Postpartum care and examination    3.  Cesarean delivery delivered     Plan:  See orders and Patient Instructions Follow up in: 1 year or as needed.   Adrian Prows MD [facility], [facility] 10/20/2019 11:36 AM

## 2019-10-22 LAB — CYTOLOGY - PAP
Comment: NEGATIVE
Diagnosis: NEGATIVE
High risk HPV: NEGATIVE

## 2019-10-26 ENCOUNTER — Telehealth: Payer: Self-pay

## 2019-10-26 NOTE — Telephone Encounter (Signed)
Patient needs dates (time off from work) changed on her [place] paperwork. Cb#[phone]

## 2019-10-26 NOTE — Telephone Encounter (Signed)
Spoke w/Brittany at [employer] who verified she has the paperwork from 09/16/2019 that has the proper dates requested on it. No further information is needed for her disability claim. Toni Byrd states the patient was requesting an extension on her FMLA leave until January 9th. Advised I will discuss with patient on her needs.

## 2019-10-26 NOTE — Telephone Encounter (Signed)
Spoke w/patient details below given. She will contact us if she needs anything further.

## 2019-10-26 NOTE — Telephone Encounter (Signed)
Spoke w/patient. She states [facility] needs paperwork resent to include dates 12/17-12-22/2020. Notified patient the last set of paperwork sent on 09/16/2019 was marked as patient out of work 09/04/2019 to 11/04/2019 (which includes these dates). Patient is confused as her claim was previously marked approved and now they tell her it is pending. Advised will contact [facility] to verify what their needs are.

## 2020-03-25 IMAGING — US US MFM FETAL BPP W/O NON-STRESS
1 series · 13 of 16 positions shown · non-contrast
Comparison: none

PATIENT INFO:

PERFORMED BY:
                   Sonographer
SERVICE(S) PROVIDED:
 ----------------------------------------------------------------------
INDICATIONS:
  30 weeks gestation of pregnancy
FETAL EVALUATION:
 Num Of Fetuses:         1
BIOPHYSICAL EVALUATION:
 Amniotic F.V:   Within normal limits       F. Tone:        Observed
 F. Movement:    Observed                   Score:          [DATE]
 F. Breathing:   Observed
GESTATIONAL AGE:
 LMP:           30w 3d        Date:  01/23/19                 EDD:   10/30/19
 Best:          30w 3d     Det. By:  LMP  (01/23/19)          EDD:   10/30/19
DOPPLER - FETAL VESSELS:
 Umbilical Artery
  S/D     %tile
    4       93

[Series 1: us mfm fetal bpp w/o non-stress · 16 acquisitions, 13 frames shown]
[im 1/16]
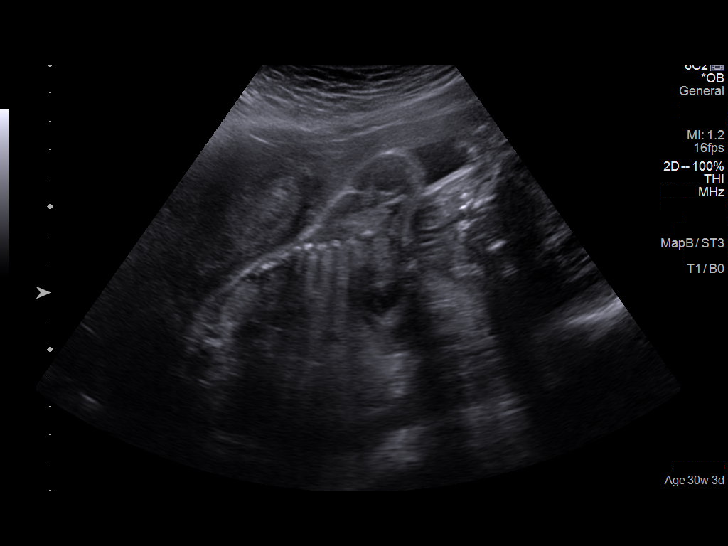
[im 2/16]
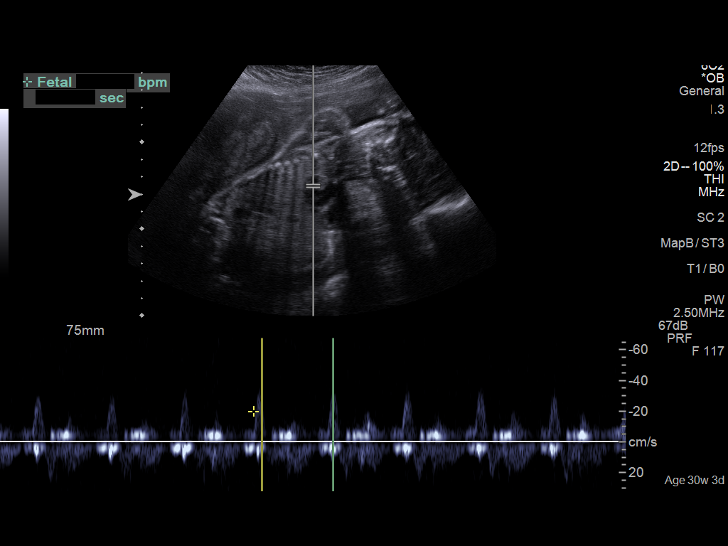
[im 4/16]
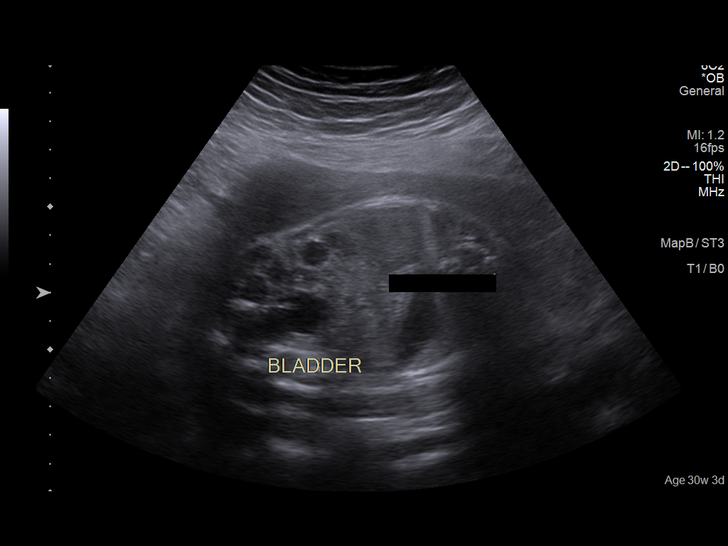
[im 5/16]
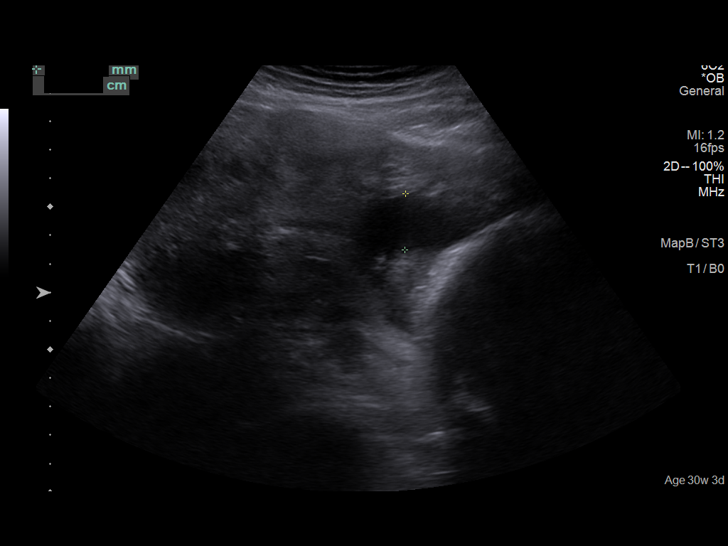
[im 6/16]
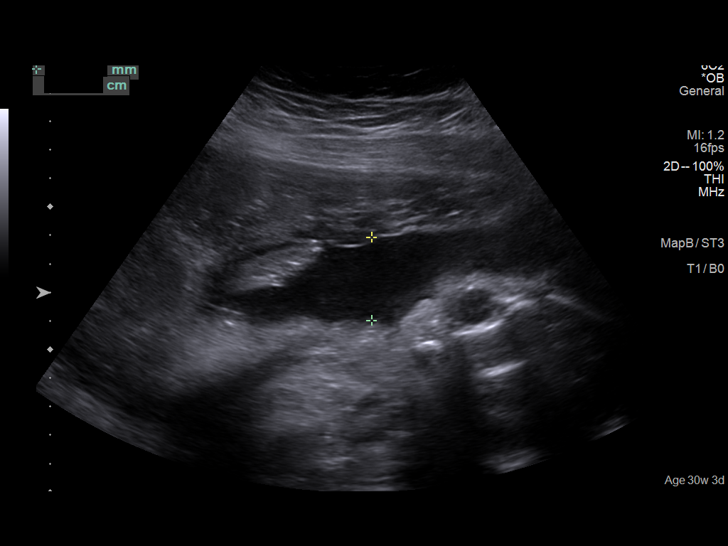
[im 7/16]
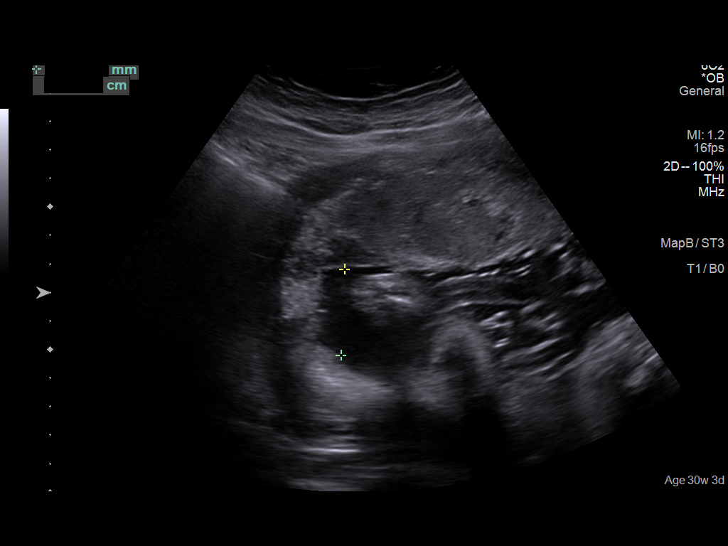
[im 9/16]
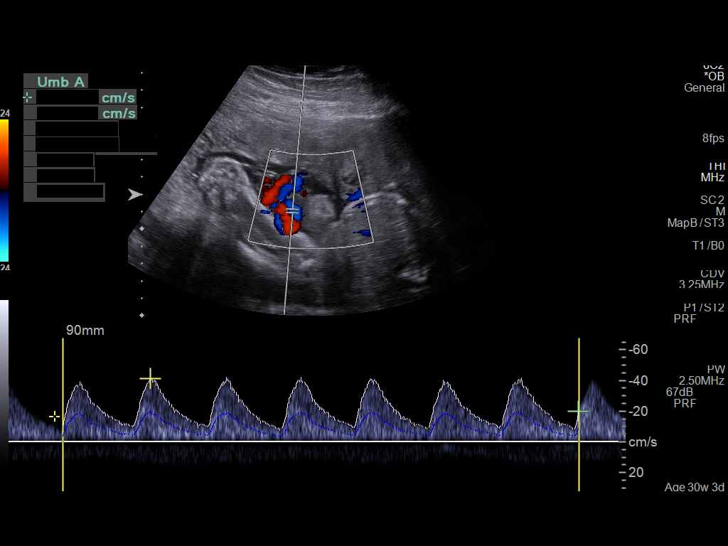
[im 10/16]
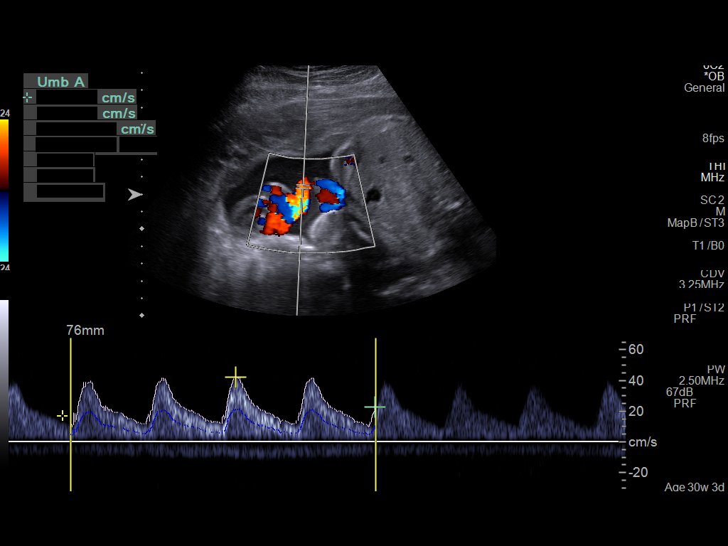
[im 11/16]
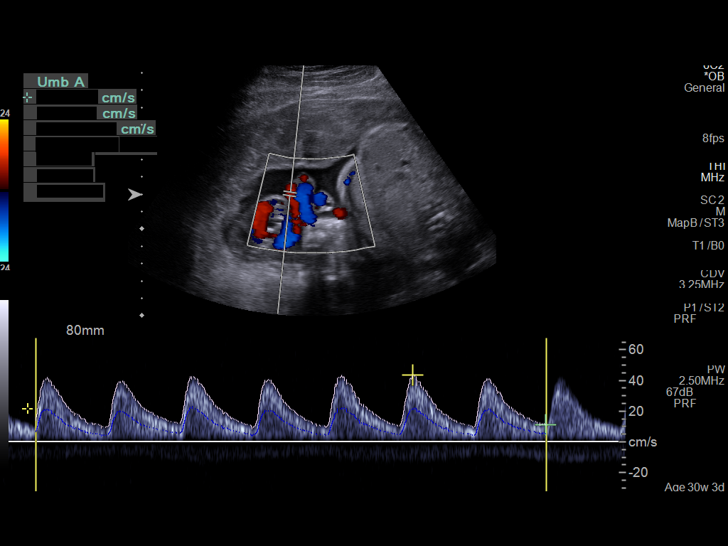
[im 12/16]
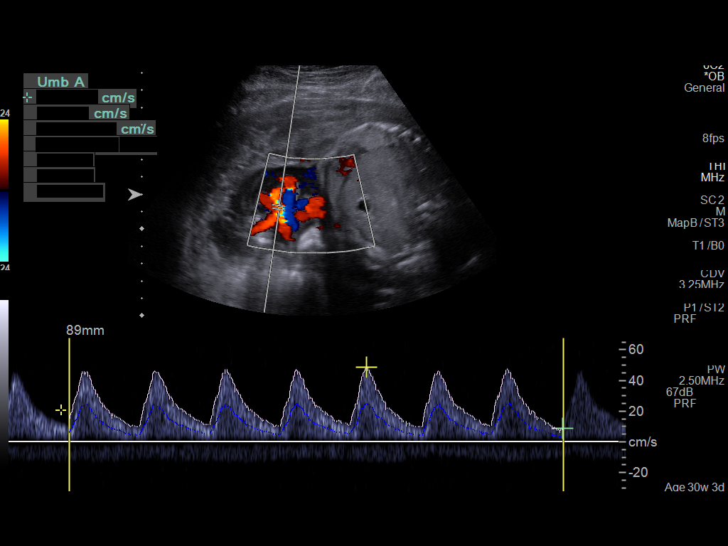
[im 13/16]
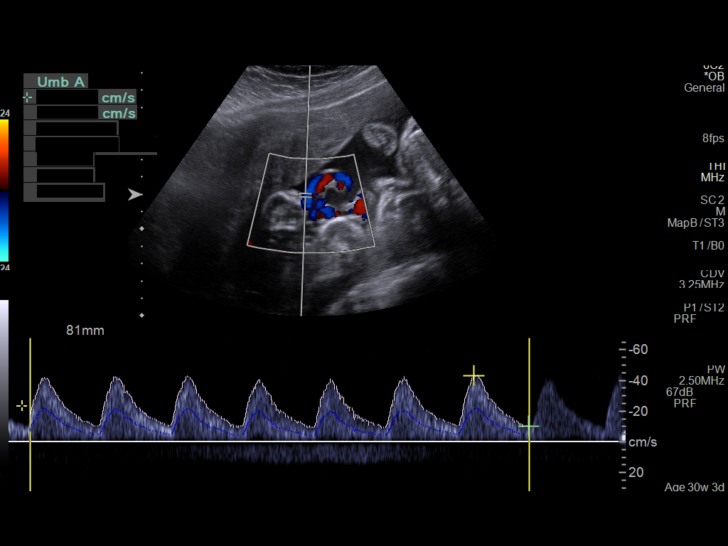
[im 15/16]
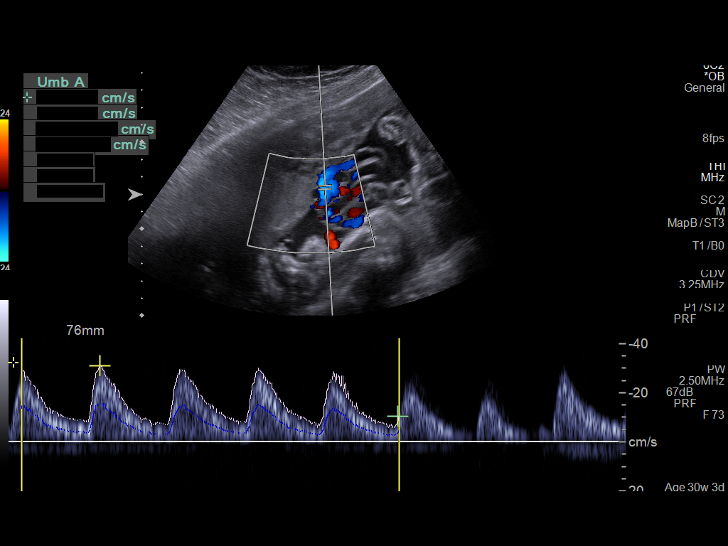
[im 16/16]
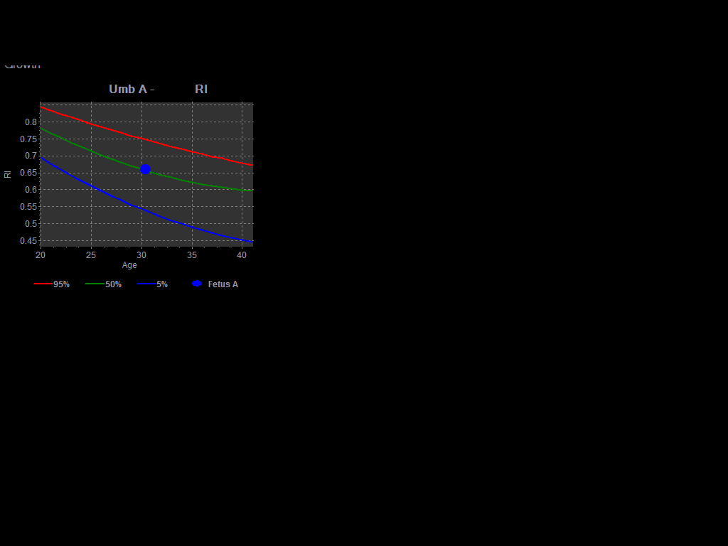

[13 of 16 positions shown; findings below may reference images not displayed]

IMPRESSION: Dear Dr. YUTING
 ,

 Thank you for referring your patient for fetal biophysical
 profile (BPP) and umbilical artery Dopplers.
 Ms Melkamu is at 30 w 3d  by LMP and earliest scan at [facility] at 8 weeks on 03/18/2019 . She  was found to be at
 <3rd percentile last week with slightly elevated dopplers . Her
 pregnancy has been complicated by multiple small fibroids
 and h/o hysteroscopic myomectomy.

 There is a singleton gestation with normal amniotic fluid
 volume.

 The BPP was noted to be [DATE], which was reassuring.

 The umbilical artery Doppler studies were within normal limits
 for gestational age (S/D =  4
   ). which is mildly elevated for this GA

  Twice Weekly testing  NST or BPP and weekly Doppler
 studies is suggested.Pt is scheduled here in [REDACTED] .
 Serial growth scans are suggested (approximately every 3
 weeks).

 Thank you for allowing us to participate in your patient's care.
 assistance.

## 2020-08-22 ENCOUNTER — Other Ambulatory Visit (INDEPENDENT_AMBULATORY_CARE_PROVIDER_SITE_OTHER): Payer: 59

## 2020-08-22 ENCOUNTER — Other Ambulatory Visit: Payer: Self-pay

## 2020-08-22 ENCOUNTER — Encounter: Payer: Self-pay | Admitting: Obstetrics and Gynecology

## 2020-08-22 ENCOUNTER — Ambulatory Visit (INDEPENDENT_AMBULATORY_CARE_PROVIDER_SITE_OTHER): Payer: 59 | Admitting: Obstetrics and Gynecology

## 2020-08-22 ENCOUNTER — Other Ambulatory Visit (HOSPITAL_COMMUNITY)
Admission: RE | Admit: 2020-08-22 | Discharge: 2020-08-22 | Disposition: A | Discharge status: Home / Self Care | Payer: 59 | Source: Ambulatory Visit | Attending: Obstetrics and Gynecology | Admitting: Obstetrics and Gynecology

## 2020-08-22 VITALS — BP 120/70 | Ht 67.0 in | Wt 204.4 lb

## 2020-08-22 DIAGNOSIS — O0990 Supervision of high risk pregnancy, unspecified, unspecified trimester: Secondary | ICD-10-CM | POA: Insufficient documentation

## 2020-08-22 DIAGNOSIS — N926 Irregular menstruation, unspecified: Secondary | ICD-10-CM

## 2020-08-22 DIAGNOSIS — Z3490 Encounter for supervision of normal pregnancy, unspecified, unspecified trimester: Secondary | ICD-10-CM

## 2020-08-22 DIAGNOSIS — O09521 Supervision of elderly multigravida, first trimester: Secondary | ICD-10-CM

## 2020-08-22 DIAGNOSIS — Z1379 Encounter for other screening for genetic and chromosomal anomalies: Secondary | ICD-10-CM

## 2020-08-22 NOTE — Progress Notes (Signed)
08/22/2020   Chief Complaint: Missed period  Transfer of Care Patient: yes  History of Present Illness: Toni Byrd is a 39 y.o. [id number] [redacted]w[redacted]d based on Patient's last menstrual period was 05/26/2020. with an Estimated Date of Delivery: 03/02/21, with the above CC.   Her periods were: regular periods, montlhy She was using no method when she conceived.  She has Positive signs or symptoms of nausea/vomiting of pregnancy. She has Negative signs or symptoms of miscarriage or preterm labor She was not taking different medications around the time she conceived/early pregnancy. Since her LMP, she has not used alcohol Since her LMP, she has not used tobacco products Since her LMP, she has not used illegal drugs.    Current or past history of domestic violence. no  Infection History:  1. Since her LMP, she has not had a viral illness.  2. She admits to close contact with children on a regular basis     3. She has a history of chicken pox. She reports vaccination for chicken pox in the past. 4. Patient or partner has history of genital herpes  no 5. History of STI (GC, CT, HPV, syphilis, HIV)  no   6.  She does not live with someone with TB or TB exposed. 7. History of recent travel :  no 8. She identifies Negative Zika risk factors for her and her partner 54. There are not cats in the home in the home.  She understands that while pregnant she should not change cat litter.   Genetic Screening Questions: (Includes patient, baby's father, or anyone in either family)   1. Patient's age >/= 43 [facility]  yes 2. Thalassemia ([place], [place], [place], or Asian background): MCV<80  no 3. Neural tube defect (meningomyelocele, spina bifida, anencephaly)  no 4. Congenital heart defect  not applicable  5. Down syndrome  no 6. Tay-Sachs (Jewish, [place])  no 7. Canavan's Disease  no 8. Sickle cell disease or trait (African)  no  9. Hemophilia or other blood disorders  no  10. Muscular  dystrophy  no  11. Cystic fibrosis  no  12. Huntington's Chorea  no  13. Mental retardation/autism  no 14. Other inherited genetic or chromosomal disorder  no 15. Maternal metabolic disorder (DM, PKU, etc)  no 16. Patient or FOB with a child with a birth defect not listed above no  16a. Patient or FOB with a birth defect themselves no 17. Recurrent pregnancy loss, or stillbirth  no  18. Any medications since LMP other than prenatal vitamins (include vitamins, supplements, OTC meds, drugs, alcohol)  no 19. Any other genetic/environmental exposure to discuss  no  ROS:  Review of Systems  Constitutional: Negative for chills, fever, malaise/fatigue and weight loss.  HENT: Negative for congestion, hearing loss and sinus pain.   Eyes: Negative for blurred vision and double vision.  Respiratory: Negative for cough, sputum production, shortness of breath and wheezing.   Cardiovascular: Negative for chest pain, palpitations, orthopnea and leg swelling.  Gastrointestinal: Negative for abdominal pain, constipation, diarrhea, nausea and vomiting.  Genitourinary: Negative for dysuria, flank pain, frequency, hematuria and urgency.  Musculoskeletal: Negative for back pain, falls and joint pain.  Skin: Negative for itching and rash.  Neurological: Negative for dizziness and headaches.  Psychiatric/Behavioral: Negative for depression, substance abuse and suicidal ideas. The patient is not nervous/anxious.     OBGYN History: As per HPI. OB History  Gravida Para Term Preterm AB Living  4 1   1  2 1  SAB TAB Ectopic Multiple Live Births  2     0 1    # Outcome Date GA Lbr Len/2nd Weight Sex Delivery Anes PTL Lv  4 Current           3 Preterm 09/09/19 [redacted]w[redacted]d  2 lb 9.3 oz (1.17 kg) M CS-LVertical Spinal  LIV  2 SAB           1 SAB             Any issues with any prior pregnancies: no Any prior children are healthy, doing well, without any problems or issues: yes Last pap smear 2020 NIL History of  STIs: No   Past Medical History: Past Medical History:  Diagnosis Date  . Bacterial vaginitis   . Dysmenorrhea   . Fibroid   . Hydrosalpinx   . Spontaneous abortion   . Trichomoniasis     Past Surgical History: Past Surgical History:  Procedure Laterality Date  . CESAREAN SECTION N/A 09/09/2019   Procedure: CESAREAN SECTION;  Surgeon: Homero Fellers, MD;  Location: [facility] ORS;  Service: Obstetrics;  Laterality: N/A;  . DILATATION & CURETTAGE/HYSTEROSCOPY WITH MYOSURE N/A 01/23/2018   Procedure: DILATATION & CURETTAGE/HYSTEROSCOPY WITH MYOSURE,REMOVAL OF UTERINE LESION;  Surgeon: Will Bonnet, MD;  Location: [facility] ORS;  Service: Gynecology;  Laterality: N/A;  . LASIK      Family History:  Family History  Problem Relation Age of Onset  . Rheum arthritis Mother   . Hypothyroidism Mother   . Hypertension Mother   . Thyroid disease Mother   . Diabetes Father   . Prostate cancer Father 37  . Breast cancer Maternal Grandmother 80   She denies any female cancers, bleeding or blood clotting disorders.   Social History:  Social History   Socioeconomic History  . Marital status: Married    Spouse name: Jaquelyn Bitter  . Number of children: Not on file  . Years of education: Not on file  . Highest education level: Not on file  Occupational History  . Not on file  Tobacco Use  . Smoking status: Never Smoker  . Smokeless tobacco: Never Used  Vaping Use  . Vaping Use: Never used  Substance and Sexual Activity  . Alcohol use: Not Currently    Comment: occ  . Drug use: No  . Sexual activity: Not Currently    Partners: Male    Birth control/protection: None  Other Topics Concern  . Not on file  Social History Narrative  . Not on file   Social Determinants of Health   Financial Resource Strain:   . Difficulty of Paying Living Expenses: Not on file  Food Insecurity:   . Worried About Charity fundraiser in the Last Year: Not on file  . Ran Out of Food in the Last  Year: Not on file  Transportation Needs:   . Lack of Transportation (Medical): Not on file  . Lack of Transportation (Non-Medical): Not on file  Physical Activity: Unknown  . Days of Exercise per Week: 0 days  . Minutes of Exercise per Session: Not asked  Stress:   . Feeling of Stress : Not on file  Social Connections:   . Frequency of Communication with Friends and Family: Not on file  . Frequency of Social Gatherings with Friends and Family: Not on file  . Attends Religious Services: Not on file  . Active Member of Clubs or Organizations: Not on file  . Attends Club or  Organization Meetings: Not on file  . Marital Status: Not on file  Intimate Partner Violence:   . Fear of Current or Ex-Partner: Not on file  . Emotionally Abused: Not on file  . Physically Abused: Not on file  . Sexually Abused: Not on file    Allergy: Allergies  Allergen Reactions  . Other Rash    ADHESIVE FROM BANDAIDS-"BURNS SKIN"    Current Outpatient Medications:  Current Outpatient Medications:  .  Ascorbic Acid (VITAMIN C) 100 MG tablet, Take 150 mg by mouth daily., Disp: , Rfl:  .  ferrous sulfate 325 (65 FE) MG tablet, Take 325 mg by mouth daily with breakfast., Disp: , Rfl:  .  Prenatal Vit-Fe Fumarate-FA (MULTIVITAMIN-PRENATAL) 27-0.8 MG TABS tablet, Take 1 tablet by mouth daily at 12 noon., Disp: , Rfl:    Physical Exam: Physical Exam Vitals and nursing note reviewed.  HENT:     Head: Normocephalic and atraumatic.  Eyes:     Pupils: Pupils are equal, round, and reactive to light.  Neck:     Thyroid: No thyromegaly.  Cardiovascular:     Rate and Rhythm: Normal rate and regular rhythm.  Pulmonary:     Effort: Pulmonary effort is normal.  Abdominal:     General: Bowel sounds are normal. There is no distension.     Palpations: Abdomen is soft.     Tenderness: There is no abdominal tenderness. There is no guarding or rebound.  Musculoskeletal:        General: Normal range of motion.      Cervical back: Normal range of motion and neck supple.  Skin:    General: Skin is warm and dry.  Neurological:     Mental Status: She is alert and oriented to person, place, and time.  Psychiatric:        Mood and Affect: Affect normal.        Judgment: Judgment normal.    Assessment: Ms. Slabach is a 39 y.o. [id number] [redacted]w[redacted]d based on Patient's last menstrual period was 05/26/2020. with an Estimated Date of Delivery: 03/02/21,  for prenatal care.  Plan:  1) Avoid alcoholic beverages. 2) Patient encouraged not to smoke.  3) Discontinue the use of all non-medicinal drugs and chemicals.  4) Take prenatal vitamins daily.  5) Seatbelt use advised 6) Nutrition, food safety (fish, cheese advisories, and high nitrite foods) and exercise discussed. 7) Hospital and practice style delivering at [facility] discussed  8) Patient is asked about travel to areas at risk for the [place] virus, and counseled to avoid travel and exposure to mosquitoes or sexual partners who may have themselves been exposed to the virus. Testing is discussed, and will be ordered as appropriate.  9) Childbirth classes at [facility] advised 10) Genetic Screening, such as with 1st Trimester Screening, cell free fetal DNA, AFP testing, and Ultrasound, as well as with amniocentesis and CVS as appropriate, is discussed with patient. She plans to have genetic testing this pregnancy.   History of premature delivery secondary to early onset preeclampsia History of preeclampsia- encouraged daily 81 mg ASA initiation Dating [place] today  Problem list reviewed and updated.  I discussed the assessment and treatment plan with the patient. The patient was provided an opportunity to ask questions and all were answered. The patient agreed with the plan and demonstrated an understanding of the instructions.  Adrian Prows MD [facility], [facility] 08/22/2020 9:39 AM

## 2020-08-22 NOTE — Patient Instructions (Signed)
First Trimester of Pregnancy The first trimester of pregnancy is from week 1 until the end of week 13 (months 1 through 3). A week after a sperm fertilizes an egg, the egg will implant on the wall of the uterus. This embryo will begin to develop into a baby. Genes from you and your partner will form the baby. The female genes will determine whether the baby will be a boy or a girl. At 6-8 weeks, the eyes and face will be formed, and the heartbeat can be seen on ultrasound. At the end of 12 weeks, all the baby's organs will be formed. Now that you are pregnant, you will want to do everything you can to have a healthy baby. Two of the most important things are to get good prenatal care and to follow your health care provider's instructions. Prenatal care is all the medical care you receive before the baby's birth. This care will help prevent, find, and treat any problems during the pregnancy and childbirth. Body changes during your first trimester Your body goes through many changes during pregnancy. The changes vary from woman to woman.  You may gain or lose a couple of pounds at first.  You may feel sick to your stomach (nauseous) and you may throw up (vomit). If the vomiting is uncontrollable, call your health care provider.  You may tire easily.  You may develop headaches that can be relieved by medicines. All medicines should be approved by your health care provider.  You may urinate more often. Painful urination may mean you have a bladder infection.  You may develop heartburn as a result of your pregnancy.  You may develop constipation because certain hormones are causing the muscles that push stool through your intestines to slow down.  You may develop hemorrhoids or swollen veins (varicose veins).  Your breasts may begin to grow larger and become tender. Your nipples may stick out more, and the tissue that surrounds them (areola) may become darker.  Your gums may bleed and may be  sensitive to brushing and flossing.  Dark spots or blotches (chloasma, mask of pregnancy) may develop on your face. This will likely fade after the baby is born.  Your menstrual periods will stop.  You may have a loss of appetite.  You may develop cravings for certain kinds of food.  You may have changes in your emotions from day to day, such as being excited to be pregnant or being concerned that something may go wrong with the pregnancy and baby.  You may have more vivid and strange dreams.  You may have changes in your hair. These can include thickening of your hair, rapid growth, and changes in texture. Some women also have hair loss during or after pregnancy, or hair that feels dry or thin. Your hair will most likely return to normal after your baby is born. What to expect at prenatal visits During a routine prenatal visit:  You will be weighed to make sure you and the baby are growing normally.  Your blood pressure will be taken.  Your abdomen will be measured to track your baby's growth.  The fetal heartbeat will be listened to between weeks 10 and 14 of your pregnancy.  Test results from any previous visits will be discussed. Your health care provider may ask you:  How you are feeling.  If you are feeling the baby move.  If you have had any abnormal symptoms, such as leaking fluid, bleeding, severe headaches, or abdominal   cramping.  If you are using any tobacco products, including cigarettes, chewing tobacco, and electronic cigarettes.  If you have any questions. Other tests that may be performed during your first trimester include:  Blood tests to find your blood type and to check for the presence of any previous infections. The tests will also be used to check for low iron levels (anemia) and protein on red blood cells (Rh antibodies). Depending on your risk factors, or if you previously had diabetes during pregnancy, you may have tests to check for high blood sugar  that affects pregnant women (gestational diabetes).  Urine tests to check for infections, diabetes, or protein in the urine.  An ultrasound to confirm the proper growth and development of the baby.  Fetal screens for spinal cord problems (spina bifida) and Down syndrome.  HIV (human immunodeficiency virus) testing. Routine prenatal testing includes screening for HIV, unless you choose not to have this test.  You may need other tests to make sure you and the baby are doing well. Follow these instructions at home: Medicines  Follow your health care provider's instructions regarding medicine use. Specific medicines may be either safe or unsafe to take during pregnancy.  Take a prenatal vitamin that contains at least 600 micrograms (mcg) of folic acid.  If you develop constipation, try taking a stool softener if your health care provider approves. Eating and drinking   Eat a balanced diet that includes fresh fruits and vegetables, whole grains, good sources of protein such as meat, eggs, or tofu, and low-fat dairy. Your health care provider will help you determine the amount of weight gain that is right for you.  Avoid raw meat and uncooked cheese. These carry germs that can cause birth defects in the baby.  Eating four or five small meals rather than three large meals a day may help relieve nausea and vomiting. If you start to feel nauseous, eating a few soda crackers can be helpful. Drinking liquids between meals, instead of during meals, also seems to help ease nausea and vomiting.  Limit foods that are high in fat and processed sugars, such as fried and sweet foods.  To prevent constipation: ? Eat foods that are high in fiber, such as fresh fruits and vegetables, whole grains, and beans. ? Drink enough fluid to keep your urine clear or pale yellow. Activity  Exercise only as directed by your health care provider. Most women can continue their usual exercise routine during  pregnancy. Try to exercise for 30 minutes at least 5 days a week. Exercising will help you: ? Control your weight. ? Stay in shape. ? Be prepared for labor and delivery.  Experiencing pain or cramping in the lower abdomen or lower back is a good sign that you should stop exercising. Check with your health care provider before continuing with normal exercises.  Try to avoid standing for long periods of time. Move your legs often if you must stand in one place for a long time.  Avoid heavy lifting.  Wear low-heeled shoes and practice good posture.  You may continue to have sex unless your health care provider tells you not to. Relieving pain and discomfort  Wear a good support bra to relieve breast tenderness.  Take warm sitz baths to soothe any pain or discomfort caused by hemorrhoids. Use hemorrhoid cream if your health care provider approves.  Rest with your legs elevated if you have leg cramps or low back pain.  If you develop varicose veins in   your legs, wear support hose. Elevate your feet for 15 minutes, 3-4 times a day. Limit salt in your diet. Prenatal care  Schedule your prenatal visits by the twelfth week of pregnancy. They are usually scheduled monthly at first, then more often in the last 2 months before delivery.  Write down your questions. Take them to your prenatal visits.  Keep all your prenatal visits as told by your health care provider. This is important. Safety  Wear your seat belt at all times when driving.  Make a list of emergency phone numbers, including numbers for family, friends, the hospital, and police and fire departments. General instructions  Ask your health care provider for a referral to a local prenatal education class. Begin classes no later than the beginning of month 6 of your pregnancy.  Ask for help if you have counseling or nutritional needs during pregnancy. Your health care provider can offer advice or refer you to specialists for help  with various needs.  Do not use hot tubs, steam rooms, or saunas.  Do not douche or use tampons or scented sanitary pads.  Do not cross your legs for long periods of time.  Avoid cat litter boxes and soil used by cats. These carry germs that can cause birth defects in the baby and possibly loss of the fetus by miscarriage or stillbirth.  Avoid all smoking, herbs, alcohol, and medicines not prescribed by your health care provider. Chemicals in these products affect the formation and growth of the baby.  Do not use any products that contain nicotine or tobacco, such as cigarettes and e-cigarettes. If you need help quitting, ask your health care provider. You may receive counseling support and other resources to help you quit.  Schedule a dentist appointment. At home, brush your teeth with a soft toothbrush and be gentle when you floss. Contact a health care provider if:  You have dizziness.  You have mild pelvic cramps, pelvic pressure, or nagging pain in the abdominal area.  You have persistent nausea, vomiting, or diarrhea.  You have a bad smelling vaginal discharge.  You have pain when you urinate.  You notice increased swelling in your face, hands, legs, or ankles.  You are exposed to fifth disease or chickenpox.  You are exposed to German measles (rubella) and have never had it. Get help right away if:  You have a fever.  You are leaking fluid from your vagina.  You have spotting or bleeding from your vagina.  You have severe abdominal cramping or pain.  You have rapid weight gain or loss.  You vomit blood or material that looks like coffee grounds.  You develop a severe headache.  You have shortness of breath.  You have any kind of trauma, such as from a fall or a car accident. Summary  The first trimester of pregnancy is from week 1 until the end of week 13 (months 1 through 3).  Your body goes through many changes during pregnancy. The changes vary from  woman to woman.  You will have routine prenatal visits. During those visits, your health care provider will examine you, discuss any test results you may have, and talk with you about how you are feeling. This information is not intended to replace advice given to you by your health care provider. Make sure you discuss any questions you have with your health care provider. Document Revised: 10/11/2017 Document Reviewed: 10/10/2016 Elsevier Patient Education  2020 [employer]  

## 2020-08-23 LAB — CERVICOVAGINAL ANCILLARY ONLY
Chlamydia: NEGATIVE
Comment: NEGATIVE
Comment: NEGATIVE
Comment: NORMAL
Neisseria Gonorrhea: NEGATIVE
Trichomonas: NEGATIVE

## 2020-08-23 LAB — COMPREHENSIVE METABOLIC PANEL
ALT: 12 IU/L (ref 0–32)
AST: 11 IU/L (ref 0–40)
Albumin/Globulin Ratio: 1.6 (ref 1.2–2.2)
Albumin: 4 g/dL (ref 3.8–4.8)
Alkaline Phosphatase: 72 IU/L (ref 44–121)
BUN/Creatinine Ratio: 9 (ref 9–23)
BUN: 4 mg/dL — ABNORMAL LOW (ref 6–20)
Bilirubin Total: 0.2 mg/dL (ref 0.0–1.2)
CO2: 20 mmol/L (ref 20–29)
Calcium: 9.4 mg/dL (ref 8.7–10.2)
Chloride: 102 mmol/L (ref 96–106)
Creatinine, Ser: 0.43 mg/dL — ABNORMAL LOW (ref 0.57–1.00)
GFR calc Af Amer: 150 mL/min/{1.73_m2} (ref >59)
GFR calc non Af Amer: 130 mL/min/{1.73_m2} (ref >59)
Globulin, Total: 2.5 g/dL (ref 1.5–4.5)
Glucose: 86 mg/dL (ref 65–99)
Potassium: 3.8 mmol/L (ref 3.5–5.2)
Sodium: 137 mmol/L (ref 134–144)
Total Protein: 6.5 g/dL (ref 6.0–8.5)

## 2020-08-23 LAB — RPR+RH+ABO+RUB AB+AB SCR+CB...
Antibody Screen: NEGATIVE
HIV Screen 4th Generation wRfx: NONREACTIVE
Hematocrit: 38.3 % (ref 34.0–46.6)
Hemoglobin: 12.7 g/dL (ref 11.1–15.9)
Hepatitis B Surface Ag: NEGATIVE
MCH: 27 pg (ref 26.6–33.0)
MCHC: 33.2 g/dL (ref 31.5–35.7)
MCV: 82 fL (ref 79–97)
Platelets: 243 10*3/uL (ref 150–450)
RBC: 4.7 x10E6/uL (ref 3.77–5.28)
RDW: 12.9 % (ref 11.7–15.4)
RPR Ser Ql: NONREACTIVE
Rh Factor: POSITIVE
Rubella Antibodies, IGG: 7.86 index (ref >0.99)
Varicella zoster IgG: 777 index (ref >165)
WBC: 7.7 10*3/uL (ref 3.4–10.8)

## 2020-08-23 LAB — PROTEIN / CREATININE RATIO, URINE
Creatinine, Urine: 178.5 mg/dL
Protein, Ur: 20.9 mg/dL
Protein/Creat Ratio: 117 mg/g creat (ref 0–200)

## 2020-08-24 LAB — MONITOR DRUG PROFILE 10(MW)
Amphetamine Scrn, Ur: NEGATIVE ng/mL
BARBITURATE SCREEN URINE: NEGATIVE ng/mL
BENZODIAZEPINE SCREEN, URINE: NEGATIVE ng/mL
CANNABINOIDS UR QL SCN: NEGATIVE ng/mL
Cocaine (Metab) Scrn, Ur: NEGATIVE ng/mL
Creatinine(Crt), U: 208.1 mg/dL (ref 20.0–300.0)
Methadone Screen, Urine: NEGATIVE ng/mL
OXYCODONE+OXYMORPHONE UR QL SCN: NEGATIVE ng/mL
Opiate Scrn, Ur: NEGATIVE ng/mL
Ph of Urine: 6.5 (ref 4.5–8.9)
Phencyclidine Qn, Ur: NEGATIVE ng/mL
Propoxyphene Scrn, Ur: NEGATIVE ng/mL

## 2020-08-24 LAB — URINE CULTURE: Organism ID, Bacteria: NO GROWTH

## 2020-08-26 LAB — MATERNIT21 PLUS CORE+SCA
Fetal Fraction: 4
Monosomy X (Turner Syndrome): NOT DETECTED
Result (T21): NEGATIVE
Trisomy 13 (Patau syndrome): NEGATIVE
Trisomy 18 (Edwards syndrome): NEGATIVE
Trisomy 21 (Down syndrome): NEGATIVE
XXX (Triple X Syndrome): NOT DETECTED
XXY (Klinefelter Syndrome): NOT DETECTED
XYY (Jacobs Syndrome): NOT DETECTED

## 2020-09-05 ENCOUNTER — Ambulatory Visit (INDEPENDENT_AMBULATORY_CARE_PROVIDER_SITE_OTHER): Payer: 59 | Admitting: Obstetrics & Gynecology

## 2020-09-05 ENCOUNTER — Other Ambulatory Visit: Payer: Self-pay

## 2020-09-05 ENCOUNTER — Encounter: Payer: Self-pay | Admitting: Obstetrics & Gynecology

## 2020-09-05 VITALS — BP 120/68 | Wt 203.0 lb

## 2020-09-05 DIAGNOSIS — Z3A14 14 weeks gestation of pregnancy: Secondary | ICD-10-CM

## 2020-09-05 DIAGNOSIS — O0990 Supervision of high risk pregnancy, unspecified, unspecified trimester: Secondary | ICD-10-CM

## 2020-09-05 DIAGNOSIS — Z98891 History of uterine scar from previous surgery: Secondary | ICD-10-CM

## 2020-09-05 DIAGNOSIS — O09521 Supervision of elderly multigravida, first trimester: Secondary | ICD-10-CM

## 2020-09-05 LAB — POCT URINALYSIS DIPSTICK OB
Glucose, UA: NEGATIVE
POC,PROTEIN,UA: NEGATIVE

## 2020-09-05 NOTE — Patient Instructions (Signed)
Thank you for choosing [facility]. As part of our ongoing efforts to improve patient experience, we would appreciate your feedback. Please fill out the short survey that you will receive by mail or MyChart. Your opinion is important to [place]! -Dr Kenton Kingfisher  Prenatal Ultrasound A prenatal ultrasound exam, also called a sonogram, is an imaging test that allows your health care provider to see your baby and placenta in the uterus. This is a safe and painless test that does not expose you or your baby to any X-rays, needles, or medicines. Prenatal ultrasounds are done using a handheld plastic device (transducer) that sends out sound waves (ultrasound). The sound waves reflect off your baby's bones and other tissues to create moving images on a computer screen. There are two types of prenatal ultrasound:  Transabdominal ultrasound. During this test, a transducer is placed on your belly and moved around. A routine transabdominal ultrasound is usually done between weeks 18 and 22 of pregnancy (standard ultrasound). It may also be done between weeks 13 and 14.  Transvaginal ultrasound. During this test, a transducer that is shaped like a wand is placed inside your vagina. This type of ultrasound is usually done during early pregnancy. Prenatal ultrasounds may be used to check:  How far along your pregnancy is (stage).  Your baby's development (gestational age).  The location and condition of the organ that supplies your baby with nourishment and oxygen (placenta).  Your baby's heart rate, position, and movements.  Your baby's approximate size and weight.  The amount of fluid surrounding your baby (amniotic fluid).  If you are carrying more than one baby.  Your baby's sex (if your baby is in a position that allows the sex organs to be seen, and if you choose to learn the sex at this time).  If there are any possible problems that require more testing, such as genetic problems.  If your pregnancy  is forming outside your uterus (ectopic pregnancy). You may have other ultrasounds as needed at any point during your pregnancy. If your health care provider suspects a problem, you may also have a more detailed type of transabdominal ultrasound (advanced ultrasound). What are the risks? Generally, this is a safe test. There are no known risks for you or your baby from a prenatal ultrasound. What happens before the test?  Before a transabdominal ultrasound, you may be asked to drink fluid 2 hours before the exam and avoid emptying your bladder. A full bladder helps the images show up more clearly.  Before a transvaginal ultrasound, you may be asked to empty your bladder before the exam.  Wear loose, comfortable clothing so it is easy to undress or expose your lower belly for the exam. What happens during the test? If you are having a transabdominal ultrasound:  You will lie on an exam table.  Your belly will be exposed.  Gel will be rubbed over your belly.  The transducer will be pressed on your belly and moved back and forth, through the gel. You may feel slight pressure, but there should not be any pain.  You may be asked to change your position.  You may hear sounds of blood flow and your baby's heartbeat. You may be able to see images of your baby on the computer screen. Your health care provider may measure your baby's head and other body parts, looking for normal development.  After the exam, the gel will be cleaned off, and you can replace your clothing. You will be  able to empty your bladder after the exam is done. If you are having a transvaginal ultrasound:  You will change into a hospital gown or undress from the waist down and cover yourself with a paper sheet.  You will lie down on an exam table with your feet in footrests (stirrups).  The transducer will be covered with a protective cover and lubricated.  The transducer will be inserted into your vagina.  You may  hear sounds of blood flow and your baby's heartbeat. You may be able to see images of your baby on the computer screen.  After the exam, the transducer will be removed, and you can put your clothes back on. What can I expect after the test?  You can drive yourself home and return to all your normal activities.  A health care provider trained in interpreting ultrasounds will review the images taken during your exam and send a report to your health care provider.  It is up to you to get your test results. Ask your health care provider, or the department that is doing the test, when your results will be ready. Questions to ask your health care provider  Why am I having this prenatal ultrasound?  What information will this exam provide?  How much does this exam cost? What costs will my insurance cover?  Can my partner or support person be with me during the exam?  When can I expect to get the results? Summary  A prenatal ultrasound is a safe and painless imaging exam that gives information about your pregnancy and your developing baby.  Transvaginal ultrasound exams are often done in early pregnancy. Standard transabdominal ultrasounds are typically done between 18 and 22 weeks of pregnancy. You may have other prenatal ultrasounds as needed.  This exam has no risks for you or your baby. After the exam, you can go home and return to all your usual activities. This information is not intended to replace advice given to you by your health care provider. Make sure you discuss any questions you have with your health care provider. Document Revised: 02/20/2019 Document Reviewed: 01/01/2018 Elsevier Patient Education  2020 [employer].

## 2020-09-05 NOTE — Addendum Note (Signed)
Addended by: Quintella Baton D on: 09/05/2020 09:15 AM   Modules accepted: Orders

## 2020-09-05 NOTE — Progress Notes (Signed)
Prenatal Visit Note Date: 09/05/2020 Clinic: [facility]  Subjective:  Toni Byrd is a 39 y.o. [id number] at [redacted]w[redacted]d being seen today for ongoing prenatal care.  She is currently monitored for the following issues for this high-risk pregnancy and has History of recurrent miscarriages; Fibroid uterus; Advanced maternal age, primigravida, antepartum; Uterine fibroids affecting pregnancy; Supervision of high risk pregnancy, antepartum; and History of cesarean delivery on their problem list.  Patient reports mild nausea, improving.  Denies headache..    .  .   . Denies leaking of fluid.   The following portions of the patient's history were reviewed and updated as appropriate: allergies, current medications, past family history, past medical history, past social history, past surgical history and problem list. Problem list updated.  Objective:   Vitals:   09/05/20 0827  BP: 120/68  Weight: 203 lb (92.1 kg)    Fetal Status:           General:  Alert, oriented and cooperative. Patient is in no acute distress.  Skin: Skin is warm and dry. No rash noted.   Cardiovascular: Normal heart rate noted  Respiratory: Normal respiratory effort, no problems with respiration noted  Abdomen: Soft, gravid, appropriate for gestational age.       Pelvic:  Cervical exam deferred        Extremities: Normal range of motion.     Mental Status: Normal mood and affect. Normal behavior. Normal judgment and thought content.   Urinalysis:      Assessment and Plan:  Pregnancy: [id number] at [redacted]w[redacted]d  1. [redacted] weeks gestation of pregnancy PNV Anat [place] nv  2. Supervision of high risk pregnancy, antepartum ASA daily  3. Multigravida of advanced maternal age in first trimester NIPT done  4. History of cesarean delivery Low vert, < 1 year ago, at 32 weeks.  Likely would benefit from repeat CS 39 weeks.  Preterm labor symptoms and general obstetric precautions including but not limited to vaginal bleeding,  contractions, leaking of fluid and fetal movement were reviewed in detail with the patient. Please refer to After Visit Summary for other counseling recommendations.   pregnancy 4 Problems (from 08/22/20 to present)    Problem Noted Resolved   Supervision of high risk pregnancy, antepartum 08/22/2020 by Homero Fellers, MD No   Overview Addendum 09/05/2020  9:10 AM by Gae Dry, MD     Nursing Staff Provider  Office Location  [facility] Dating   LMP = 12 wk [place]  Language  English Anatomy US    Flu Vaccine   08/22/2020 Genetic Screen  NIPS:   nml XX   TDaP vaccine    Hgb A1C or  GTT Third trimester :   Rhogam   not needed   LAB RESULTS   Feeding Plan  Blood Type O/Positive/-- (10/11 1005)   Contraception  Antibody Negative (10/11 1005)  Circumcision  Rubella 7.86 (10/11 1005)  Pediatrician   RPR Non Reactive (10/11 1005)   Support Person  HBsAg Negative (10/11 1005)   Prenatal Classes  HIV Non Reactive (10/11 1005)    Varicella   BTL Consent  GBS NEGATIVE/-- (10/27 1824)(For PCN allergy, check sensitivities)        VBAC Consent  Desires repeat Pap  2020 NIL   COVID vaccinated Hgb Electro      CF      SMA         High Risk Pregnancy Diagnoses History of preeclampsia- severe History of cesarean section (low vert)  Advanced maternal age History of IUGR  History of premature delivery      Previous Version       Return in about 5 weeks (around 10/10/2020) for [place] w Anat [place].  Barnett Applebaum, MD, [facility], [facility] 09/05/2020  9:11 AM

## 2020-09-09 ENCOUNTER — Encounter: Payer: Self-pay | Admitting: Obstetrics and Gynecology

## 2020-09-21 NOTE — Telephone Encounter (Signed)
Please  give to patient

## 2020-10-10 ENCOUNTER — Other Ambulatory Visit: Payer: Self-pay | Admitting: Obstetrics and Gynecology

## 2020-10-10 ENCOUNTER — Other Ambulatory Visit: Payer: Self-pay

## 2020-10-10 ENCOUNTER — Encounter: Payer: Self-pay | Admitting: Obstetrics and Gynecology

## 2020-10-10 ENCOUNTER — Ambulatory Visit (INDEPENDENT_AMBULATORY_CARE_PROVIDER_SITE_OTHER): Payer: 59 | Admitting: Obstetrics and Gynecology

## 2020-10-10 ENCOUNTER — Other Ambulatory Visit (INDEPENDENT_AMBULATORY_CARE_PROVIDER_SITE_OTHER): Payer: 59

## 2020-10-10 VITALS — BP 122/74 | Wt 203.0 lb

## 2020-10-10 DIAGNOSIS — Z3689 Encounter for other specified antenatal screening: Secondary | ICD-10-CM | POA: Diagnosis not present

## 2020-10-10 DIAGNOSIS — Z3A19 19 weeks gestation of pregnancy: Secondary | ICD-10-CM

## 2020-10-10 DIAGNOSIS — O3412 Maternal care for benign tumor of corpus uteri, second trimester: Secondary | ICD-10-CM

## 2020-10-10 DIAGNOSIS — O09299 Supervision of pregnancy with other poor reproductive or obstetric history, unspecified trimester: Secondary | ICD-10-CM

## 2020-10-10 DIAGNOSIS — O09512 Supervision of elderly primigravida, second trimester: Secondary | ICD-10-CM

## 2020-10-10 DIAGNOSIS — D259 Leiomyoma of uterus, unspecified: Secondary | ICD-10-CM

## 2020-10-10 DIAGNOSIS — Z98891 History of uterine scar from previous surgery: Secondary | ICD-10-CM

## 2020-10-10 DIAGNOSIS — O0992 Supervision of high risk pregnancy, unspecified, second trimester: Secondary | ICD-10-CM

## 2020-10-10 NOTE — Progress Notes (Signed)
Routine Prenatal Care Visit  Subjective  Toni Byrd is a 39 y.o. [phone] at [redacted]w[redacted]d being seen today for ongoing prenatal care.  She is currently monitored for the following issues for this high-risk pregnancy and has History of recurrent miscarriages; Fibroid uterus; Advanced maternal age, primigravida, antepartum; Uterine fibroids affecting pregnancy; Supervision of high risk pregnancy, antepartum; History of cesarean delivery; Hx of preeclampsia, prior pregnancy, currently pregnant; and History of intrauterine growth restriction in prior pregnancy, currently pregnant on their problem list.  ----------------------------------------------------------------------------------- Patient reports no complaints.    . Vag. Bleeding: None.  Movement: Absent. Leaking Fluid denies. Anatomy u/s complete today and normal. Growth normal. Patient is taking bASA.   ----------------------------------------------------------------------------------- The following portions of the patient's history were reviewed and updated as appropriate: allergies, current medications, past family history, past medical history, past social history, past surgical history and problem list. Problem list updated.  Objective  Blood pressure 122/74, weight 203 lb (92.1 kg), last menstrual period 05/26/2020, currently breastfeeding. Pregravid weight 204 lb (92.5 kg) Total Weight Gain -1 lb (-0.454 kg) Urinalysis: Urine Protein    Urine Glucose    Fetal Status: Fetal Heart Rate (bpm): present ([place])   Movement: Absent     General:  Alert, oriented and cooperative. Patient is in no acute distress.  Skin: Skin is warm and dry. No rash noted.   Cardiovascular: Normal heart rate noted  Respiratory: Normal respiratory effort, no problems with respiration noted  Abdomen: Soft, gravid, appropriate for gestational age. Pain/Pressure: Absent     Pelvic:  Cervical exam deferred        Extremities: Normal range of motion.     Mental  Status: Normal mood and affect. Normal behavior. Normal judgment and thought content.   Imaging Results US OB Comp + 14 Wk  Result Date: 10/10/2020 Patient Name: Toni Byrd DOB: December 20, 1980 MRN: [id number] ULTRASOUND REPORT Location: [facility] Date of Service: 10/10/2020 Indications:Anatomy Ultrasound Findings: Toni Byrd intrauterine pregnancy is visualized with FHR at 138 BPM. Biometrics give an (U/S) Gestational age of [redacted]w[redacted]d and an (U/S) EDD of 03/03/2021; this correlates with the clinically established Estimated Date of Delivery: 03/02/2021 Fetal presentation is Breech. EFW: 296 g (10 oz). Placenta: fundal. Grade: 1 AFI: subjectively normal. Anatomic survey is complete and normal; Gender - female.  Impression: 1. [redacted]w[redacted]d Viable Singleton Intrauterine pregnancy by U/S. 2. (U/S) EDD is consistent with Clinically established Estimated Date of Delivery: 03/02/21 . 3. Normal Anatomy Scan Gweneth Dimitri, RT There is a singleton gestation with subjectively normal amniotic fluid volume. The fetal biometry correlates with established dating. Detailed evaluation of the fetal anatomy was performed.The fetal anatomical survey appears within normal limits within the resolution of ultrasound as described above.  It must be noted that a normal ultrasound is unable to rule out fetal aneuploidy nor is it able to detect all possible malformations.   The ultrasound images and findings were reviewed by me and I agree with the above report. Prentice Docker, MD, [facility], [facility] 10/10/2020 11:33 AM       Assessment   39 y.o. [id number] at [redacted]w[redacted]d by  03/02/2021, by Last Menstrual Period presenting for routine prenatal visit  Plan   pregnancy 4 Problems (from 08/22/20 to present)    Problem Noted Resolved   Hx of preeclampsia, prior pregnancy, currently pregnant 10/10/2020 by Will Bonnet, MD No   Overview Addendum 10/10/2020 11:35 AM by Will Bonnet, MD    - severe  preeclampsia, delivery at 32 weeks [ ]  bASA      Previous Version   History of intrauterine growth restriction in prior pregnancy, currently pregnant 10/10/2020 by Will Bonnet, MD No   Supervision of high risk pregnancy, antepartum 08/22/2020 by Homero Fellers, MD No   Overview Addendum 09/05/2020  9:10 AM by Gae Dry, MD     Nursing Staff Provider  Office Location  [facility] Dating   LMP = 12 wk [place]  Language  English Anatomy US    Flu Vaccine   08/22/2020 Genetic Screen  NIPS:   nml XX   TDaP vaccine    Hgb A1C or  GTT Third trimester :   Rhogam   not needed   LAB RESULTS   Feeding Plan  Blood Type O/Positive/-- (10/11 1005)   Contraception  Antibody Negative (10/11 1005)  Circumcision  Rubella 7.86 (10/11 1005)  Pediatrician   RPR Non Reactive (10/11 1005)   Support Person  HBsAg Negative (10/11 1005)   Prenatal Classes  HIV Non Reactive (10/11 1005)    Varicella   BTL Consent  GBS NEGATIVE/-- (10/27 1824)(For PCN allergy, check sensitivities)        VBAC Consent  Desires repeat Pap  2020 NIL   COVID vaccinated Hgb Electro      CF      SMA         High Risk Pregnancy Diagnoses History of preeclampsia- severe History of cesarean section (low vert) Advanced maternal age History of IUGR  History of premature delivery      Previous Version       Preterm labor symptoms and general obstetric precautions including but not limited to vaginal bleeding, contractions, leaking of fluid and fetal movement were reviewed in detail with the patient. Please refer to After Visit Summary for other counseling recommendations.   Return in about 4 weeks (around 11/07/2020) for Routine Prenatal Appointment.   Prentice Docker, MD, [facility], [facility] 10/10/2020 11:42 AM

## 2020-11-07 ENCOUNTER — Ambulatory Visit (INDEPENDENT_AMBULATORY_CARE_PROVIDER_SITE_OTHER): Payer: 59 | Admitting: Obstetrics and Gynecology

## 2020-11-07 ENCOUNTER — Other Ambulatory Visit: Payer: Self-pay

## 2020-11-07 ENCOUNTER — Encounter: Payer: Self-pay | Admitting: Obstetrics and Gynecology

## 2020-11-07 VITALS — BP 118/68 | Wt 205.0 lb

## 2020-11-07 DIAGNOSIS — Z3A23 23 weeks gestation of pregnancy: Secondary | ICD-10-CM

## 2020-11-07 DIAGNOSIS — O09299 Supervision of pregnancy with other poor reproductive or obstetric history, unspecified trimester: Secondary | ICD-10-CM

## 2020-11-07 DIAGNOSIS — D259 Leiomyoma of uterus, unspecified: Secondary | ICD-10-CM

## 2020-11-07 DIAGNOSIS — O0992 Supervision of high risk pregnancy, unspecified, second trimester: Secondary | ICD-10-CM

## 2020-11-07 DIAGNOSIS — O3412 Maternal care for benign tumor of corpus uteri, second trimester: Secondary | ICD-10-CM

## 2020-11-07 LAB — POCT URINALYSIS DIPSTICK OB
Glucose, UA: NEGATIVE
POC,PROTEIN,UA: NEGATIVE

## 2020-11-07 NOTE — Progress Notes (Signed)
Routine Prenatal Care Visit  Subjective  Toni Byrd is a 39 y.o. [phone] at [redacted]w[redacted]d being seen today for ongoing prenatal care.  She is currently monitored for the following issues for this high-risk pregnancy and has History of recurrent miscarriages; Fibroid uterus; Advanced maternal age, primigravida, antepartum; Uterine fibroids affecting pregnancy; Supervision of high risk pregnancy, antepartum; History of cesarean delivery; Hx of preeclampsia, prior pregnancy, currently pregnant; and History of intrauterine growth restriction in prior pregnancy, currently pregnant on their problem list.  ----------------------------------------------------------------------------------- Patient reports no complaints.    . Vag. Bleeding: None.  Movement: Present. Denies leaking of fluid.  ----------------------------------------------------------------------------------- The following portions of the patient's history were reviewed and updated as appropriate: allergies, current medications, past family history, past medical history, past social history, past surgical history and problem list. Problem list updated.   Objective  Blood pressure 118/68, weight 205 lb (93 kg), last menstrual period 05/26/2020, currently breastfeeding. Pregravid weight 204 lb (92.5 kg) Total Weight Gain 1 lb (0.454 kg) Urinalysis:      Fetal Status: Fetal Heart Rate (bpm): 140   Movement: Present     General:  Alert, oriented and cooperative. Patient is in no acute distress.  Skin: Skin is warm and dry. No rash noted.   Cardiovascular: Normal heart rate noted  Respiratory: Normal respiratory effort, no problems with respiration noted  Abdomen: Soft, gravid, appropriate for gestational age. Pain/Pressure: Absent     Pelvic:  Cervical exam deferred        Extremities: Normal range of motion.     ental Status: Normal mood and affect. Normal behavior. Normal judgment and thought content.     Assessment   39 y.o.  [id number] at [redacted]w[redacted]d by  03/02/2021, by Last Menstrual Period presenting for routine prenatal visit  Plan   pregnancy 4 Problems (from 08/22/20 to present)    Problem Noted Resolved   Hx of preeclampsia, prior pregnancy, currently pregnant 10/10/2020 by Conard Novak, MD No   Overview Addendum 10/10/2020 11:35 AM by Conard Novak, MD    - severe preeclampsia, delivery at 32 weeks [ ]  bASA      Previous Version   History of intrauterine growth restriction in prior pregnancy, currently pregnant 10/10/2020 by 10/12/2020, MD No   Supervision of high risk pregnancy, antepartum 08/22/2020 by 10/22/2020, MD No   Overview Addendum 09/05/2020  9:10 AM by 09/07/2020, MD     Nursing Staff Provider  Office Location  [facility] Dating   LMP = 12 wk Nadara [place]ard  Language  English Anatomy US    Flu Vaccine   08/22/2020 Genetic Screen  NIPS:   nml XX   TDaP vaccine    Hgb A1C or  GTT Third trimester :   Rhogam   not needed   LAB RESULTS   Feeding Plan  Blood Type O/Positive/-- (10/11 1005)   Contraception  Antibody Negative (10/11 1005)  Circumcision  Rubella 7.86 (10/11 1005)  Pediatrician   RPR Non Reactive (10/11 1005)   Support Person  HBsAg Negative (10/11 1005)   Prenatal Classes  HIV Non Reactive (10/11 1005)    Varicella   BTL Consent  GBS NEGATIVE/-- (10/27 1824)(For PCN allergy, check sensitivities)        VBAC Consent  Desires repeat Pap  2020 NIL   COVID vaccinated Hgb Electro      CF      SMA  High Risk Pregnancy Diagnoses History of preeclampsia- severe History of cesarean section (low vert) Advanced maternal age History of IUGR  History of premature delivery      Previous Version      Reviewed plan for 1h GTT and growth scan at next visit.  Gestational age appropriate obstetric precautions including but not limited to vaginal bleeding, contractions, leaking of fluid and fetal movement were reviewed in detail with the patient.    Return  in about 4 weeks (around 12/05/2020) for ROB/1h GTT/growth.  Orlie Pollen, CNM, MSN [facility], [facility] 11/07/2020, 8:29 AM

## 2020-12-05 ENCOUNTER — Encounter: Payer: Self-pay | Admitting: Advanced Practice Midwife

## 2020-12-05 ENCOUNTER — Other Ambulatory Visit: Payer: Self-pay

## 2020-12-05 ENCOUNTER — Ambulatory Visit (INDEPENDENT_AMBULATORY_CARE_PROVIDER_SITE_OTHER): Payer: 59

## 2020-12-05 ENCOUNTER — Ambulatory Visit (INDEPENDENT_AMBULATORY_CARE_PROVIDER_SITE_OTHER): Payer: 59 | Admitting: Advanced Practice Midwife

## 2020-12-05 ENCOUNTER — Other Ambulatory Visit: Payer: 59

## 2020-12-05 VITALS — BP 120/70 | Ht 67.0 in | Wt 212.3 lb

## 2020-12-05 DIAGNOSIS — O0992 Supervision of high risk pregnancy, unspecified, second trimester: Secondary | ICD-10-CM

## 2020-12-05 DIAGNOSIS — O09512 Supervision of elderly primigravida, second trimester: Secondary | ICD-10-CM

## 2020-12-05 DIAGNOSIS — Z3A27 27 weeks gestation of pregnancy: Secondary | ICD-10-CM

## 2020-12-05 DIAGNOSIS — O09299 Supervision of pregnancy with other poor reproductive or obstetric history, unspecified trimester: Secondary | ICD-10-CM | POA: Diagnosis not present

## 2020-12-05 LAB — POCT URINALYSIS DIPSTICK OB
Glucose, UA: NEGATIVE
POC,PROTEIN,UA: NEGATIVE

## 2020-12-05 NOTE — Progress Notes (Signed)
Routine Prenatal Care Visit  Subjective  Toni Byrd is a 40 y.o. [phone] at [redacted]w[redacted]d being seen today for ongoing prenatal care.  She is currently monitored for the following issues for this high-risk pregnancy and has History of recurrent miscarriages; Fibroid uterus; Advanced maternal age, primigravida, antepartum; Uterine fibroids affecting pregnancy; Supervision of high risk pregnancy, antepartum; History of cesarean delivery; Hx of preeclampsia, prior pregnancy, currently pregnant; and History of intrauterine growth restriction in prior pregnancy, currently pregnant on their problem list.  ----------------------------------------------------------------------------------- Patient reports back pain with pain going down left leg. We discussed comfort measures and the possible addition of Flexeril if needed.    . Vag. Bleeding: None.  Movement: Present. Leaking Fluid denies.  ----------------------------------------------------------------------------------- The following portions of the patient's history were reviewed and updated as appropriate: allergies, current medications, past family history, past medical history, past social history, past surgical history and problem list. Problem list updated.  Objective  Blood pressure 120/70, height 5\' 7"  (1.702 m), weight 212 lb 4.8 oz (96.3 kg), last menstrual period 05/26/2020, currently breastfeeding. Pregravid weight 204 lb (92.5 kg) Total Weight Gain 8 lb 4.8 oz (3.765 kg) Urinalysis: Urine Protein    Urine Glucose    Fetal Status: Fetal Heart Rate (bpm): 120 Fundal Height: 27 cm Movement: Present      Growth scan: 44.1%, AC 42.8%, 2 pounds 5 ounces, AFI 11.3, breech  General:  Alert, oriented and cooperative. Patient is in no acute distress.  Skin: Skin is warm and dry. No rash noted.   Cardiovascular: Normal heart rate noted  Respiratory: Normal respiratory effort, no problems with respiration noted  Abdomen: Soft, gravid,  appropriate for gestational age. Pain/Pressure: Absent     Pelvic:  Cervical exam deferred        Extremities: Normal range of motion.  Edema: None  Mental Status: Normal mood and affect. Normal behavior. Normal judgment and thought content.   Assessment   40 y.o. [id number] at [redacted]w[redacted]d by  03/02/2021, by Last Menstrual Period presenting for routine prenatal visit  Plan   pregnancy 4 Problems (from 08/22/20 to present)    Problem Noted Resolved   Hx of preeclampsia, prior pregnancy, currently pregnant 10/10/2020 by Will Bonnet, MD No   Overview Addendum 10/10/2020 11:35 AM by Will Bonnet, MD    - severe preeclampsia, delivery at 32 weeks [ ]  bASA      Previous Version   History of intrauterine growth restriction in prior pregnancy, currently pregnant 10/10/2020 by Will Bonnet, MD No   Supervision of high risk pregnancy, antepartum 08/22/2020 by Homero Fellers, MD No   Overview Addendum 09/05/2020  9:10 AM by Gae Dry, MD     Nursing Staff Provider  Office Location  [facility] Dating   LMP = 12 wk [place]  Language  English Anatomy US    Flu Vaccine   08/22/2020 Genetic Screen  NIPS:   nml XX   TDaP vaccine    Hgb A1C or  GTT Third trimester :   Rhogam   not needed   LAB RESULTS   Feeding Plan  Blood Type O/Positive/-- (10/11 1005)   Contraception  Antibody Negative (10/11 1005)  Circumcision  Rubella 7.86 (10/11 1005)  Pediatrician   RPR Non Reactive (10/11 1005)   Support Person  HBsAg Negative (10/11 1005)   Prenatal Classes  HIV Non Reactive (10/11 1005)    Varicella   BTL Consent  GBS NEGATIVE/-- (10/27 1824)(For PCN allergy, check sensitivities)  VBAC Consent  Desires repeat Pap  2020 NIL   COVID vaccinated Hgb Electro      CF      SMA         High Risk Pregnancy Diagnoses History of preeclampsia- severe History of cesarean section (low vert) Advanced maternal age History of IUGR  History of premature delivery      Previous  Version    Back pain: heat/ice, stretches/exercise, epsom salt soaks, abdominal support band, chiropractic/massage   Preterm labor symptoms and general obstetric precautions including but not limited to vaginal bleeding, contractions, leaking of fluid and fetal movement were reviewed in detail with the patient. Please refer to After Visit Summary for other counseling recommendations.   Return in about 2 weeks (around 12/19/2020) for rob.  Rod Can, CNM 12/05/2020 9:53 AM

## 2020-12-05 NOTE — Patient Instructions (Addendum)
Back Pain in Pregnancy Back pain during pregnancy is common. Back pain may be caused by several factors that are related to changes during your pregnancy. Follow these instructions at home: Managing pain, stiffness, and swelling  If directed, for sudden (acute) back pain, put ice on the painful area. ? Put ice in a plastic bag. ? Place a towel between your skin and the bag. ? Leave the ice on for 20 minutes, 2-3 times per day.  If directed, apply heat to the affected area before you exercise. Use the heat source that your health care provider recommends, such as a moist heat pack or a heating pad. ? Place a towel between your skin and the heat source. ? Leave the heat on for 20-30 minutes. ? Remove the heat if your skin turns bright red. This is especially important if you are unable to feel pain, heat, or cold. You may have a greater risk of getting burned.  If directed, massage the affected area.      Activity  Exercise as told by your health care provider. Gentle exercise is the best way to prevent or manage back pain.  Listen to your body when lifting. If lifting hurts, ask for help or bend your knees. This uses your leg muscles instead of your back muscles.  Squat down when picking up something from the floor. Do not bend over.  Only use bed rest for short periods as told by your health care provider. Bed rest should only be used for the most severe episodes of back pain. Standing, sitting, and lying down  Do not stand in one place for long periods of time.  Use good posture when sitting. Make sure your head rests over your shoulders and is not hanging forward. Use a pillow on your lower back if necessary.  Try sleeping on your side, preferably the left side, with a pregnancy support pillow or 1-2 regular pillows between your legs. ? If you have back pain after a night's rest, your bed may be too soft. ? A firm mattress may provide more support for your back during  pregnancy. General instructions  Do not wear high heels.  Eat a healthy diet. Try to gain weight within your health care provider's recommendations.  Use a maternity girdle, elastic sling, or back brace as told by your health care provider.  Take over-the-counter and prescription medicines only as told by your health care provider.  Work with a physical therapist or massage therapist to find ways to manage back pain. Acupuncture or massage therapy may be helpful.  Keep all follow-up visits as told by your health care provider. This is important. Contact a health care provider if:  Your back pain interferes with your daily activities.  You have increasing pain in other parts of your body. Get help right away if:  You develop numbness, tingling, weakness, or problems with the use of your arms or legs.  You develop severe back pain that is not controlled with medicine.  You have a change in bowel or bladder control.  You develop shortness of breath, dizziness, or you faint.  You develop nausea, vomiting, or sweating.  You have back pain that is a rhythmic, cramping pain similar to labor pains. Labor pain is usually 1-2 minutes apart, lasts for about 1 minute, and involves a bearing down feeling or pressure in your pelvis.  You have back pain and your water breaks or you have vaginal bleeding.  You have back pain or  numbness that travels down your leg.  Your back pain developed after you fell.  You develop pain on one side of your back.  You see blood in your urine.  You develop skin blisters in the area of your back pain. Summary  Back pain may be caused by several factors that are related to changes during your pregnancy.  Follow instructions as told by your health care provider for managing pain, stiffness, and swelling.  Exercise as told by your health care provider. Gentle exercise is the best way to prevent or manage back pain.  Take over-the-counter and  prescription medicines only as told by your health care provider.  Keep all follow-up visits as told by your health care provider. This is important. This information is not intended to replace advice given to you by your health care provider. Make sure you discuss any questions you have with your health care provider. Document Revised: 02/17/2019 Document Reviewed: 04/16/2018 Elsevier Patient Education  2021 Elsevier Inc.  

## 2020-12-05 NOTE — Progress Notes (Signed)
ROB 1 GTT HR

## 2020-12-06 LAB — 28 WEEK RH+PANEL
Basophils Absolute: 0 10*3/uL (ref 0.0–0.2)
Basos: 0 %
EOS (ABSOLUTE): 0.1 10*3/uL (ref 0.0–0.4)
Eos: 1 %
Gestational Diabetes Screen: 111 mg/dL (ref 65–139)
HIV Screen 4th Generation wRfx: NONREACTIVE
Hematocrit: 33.9 % — ABNORMAL LOW (ref 34.0–46.6)
Hemoglobin: 11.3 g/dL (ref 11.1–15.9)
Immature Grans (Abs): 0.1 10*3/uL (ref 0.0–0.1)
Immature Granulocytes: 1 %
Lymphocytes Absolute: 1.5 10*3/uL (ref 0.7–3.1)
Lymphs: 15 %
MCH: 27.2 pg (ref 26.6–33.0)
MCHC: 33.3 g/dL (ref 31.5–35.7)
MCV: 82 fL (ref 79–97)
Monocytes Absolute: 0.3 10*3/uL (ref 0.1–0.9)
Monocytes: 4 %
Neutrophils Absolute: 7.6 10*3/uL — ABNORMAL HIGH (ref 1.4–7.0)
Neutrophils: 79 %
Platelets: 213 10*3/uL (ref 150–450)
RBC: 4.15 x10E6/uL (ref 3.77–5.28)
RDW: 12.7 % (ref 11.7–15.4)
RPR Ser Ql: NONREACTIVE
WBC: 9.6 10*3/uL (ref 3.4–10.8)

## 2020-12-19 ENCOUNTER — Encounter: Payer: 59 | Admitting: Obstetrics and Gynecology

## 2020-12-20 ENCOUNTER — Ambulatory Visit (INDEPENDENT_AMBULATORY_CARE_PROVIDER_SITE_OTHER): Payer: 59 | Admitting: Obstetrics and Gynecology

## 2020-12-20 ENCOUNTER — Encounter: Payer: Self-pay | Admitting: Obstetrics and Gynecology

## 2020-12-20 ENCOUNTER — Other Ambulatory Visit: Payer: Self-pay

## 2020-12-20 VITALS — BP 118/70 | Ht 67.0 in | Wt 210.4 lb

## 2020-12-20 DIAGNOSIS — O09299 Supervision of pregnancy with other poor reproductive or obstetric history, unspecified trimester: Secondary | ICD-10-CM

## 2020-12-20 DIAGNOSIS — Z3A29 29 weeks gestation of pregnancy: Secondary | ICD-10-CM

## 2020-12-20 DIAGNOSIS — O09519 Supervision of elderly primigravida, unspecified trimester: Secondary | ICD-10-CM | POA: Diagnosis not present

## 2020-12-20 DIAGNOSIS — O0990 Supervision of high risk pregnancy, unspecified, unspecified trimester: Secondary | ICD-10-CM

## 2020-12-20 DIAGNOSIS — Z98891 History of uterine scar from previous surgery: Secondary | ICD-10-CM

## 2020-12-20 DIAGNOSIS — Z23 Encounter for immunization: Secondary | ICD-10-CM

## 2020-12-20 LAB — POCT URINALYSIS DIPSTICK OB
Glucose, UA: NEGATIVE
POC,PROTEIN,UA: NEGATIVE

## 2020-12-20 NOTE — Progress Notes (Signed)
Routine Prenatal Care Visit  Subjective  Toni Byrd is a 40 y.o. [phone] at [redacted]w[redacted]d being seen today for ongoing prenatal care.  She is currently monitored for the following issues for this high-risk pregnancy and has History of recurrent miscarriages; Fibroid uterus; Advanced maternal age, primigravida, antepartum; Uterine fibroids affecting pregnancy; Supervision of high risk pregnancy, antepartum; History of cesarean delivery; Hx of preeclampsia, prior pregnancy, currently pregnant; and History of intrauterine growth restriction in prior pregnancy, currently pregnant on their problem list.  ----------------------------------------------------------------------------------- Patient reports backache.   Contractions: Not present. Vag. Bleeding: None.  Movement: Present. Denies leaking of fluid.  ----------------------------------------------------------------------------------- The following portions of the patient's history were reviewed and updated as appropriate: allergies, current medications, past family history, past medical history, past social history, past surgical history and problem list. Problem list updated.   Objective  Blood pressure 118/70, height 5\' 7"  (1.702 m), weight 210 lb 6.4 oz (95.4 kg), last menstrual period 05/26/2020, currently breastfeeding. Pregravid weight 204 lb (92.5 kg) Total Weight Gain 6 lb 6.4 oz (2.903 kg) Urinalysis:      Fetal Status: Fetal Heart Rate (bpm): 130   Movement: Present     General:  Alert, oriented and cooperative. Patient is in no acute distress.  Skin: Skin is warm and dry. No rash noted.   Cardiovascular: Normal heart rate noted  Respiratory: Normal respiratory effort, no problems with respiration noted  Abdomen: Soft, gravid, appropriate for gestational age. Pain/Pressure: Absent     Pelvic:  Cervical exam deferred        Extremities: Normal range of motion.  Edema: None  Mental Status: Normal mood and affect. Normal  behavior. Normal judgment and thought content.     Assessment   40 y.o. [id number] at [redacted]w[redacted]d by  03/02/2021, by Last Menstrual Period presenting for routine prenatal visit  Plan   pregnancy 4 Problems (from 08/22/20 to present)    Problem Noted Resolved   Hx of preeclampsia, prior pregnancy, currently pregnant 10/10/2020 by Will Bonnet, MD No   Overview Addendum 10/10/2020 11:35 AM by Will Bonnet, MD    - severe preeclampsia, delivery at 32 weeks [ ]  bASA      Previous Version   History of intrauterine growth restriction in prior pregnancy, currently pregnant 10/10/2020 by Will Bonnet, MD No   Supervision of high risk pregnancy, antepartum 08/22/2020 by Homero Fellers, MD No   Overview Addendum 09/05/2020  9:10 AM by Gae Dry, MD     Nursing Staff Provider  Office Location  [facility] Dating   LMP = 12 wk [place]  Language  English Anatomy US    Flu Vaccine   08/22/2020 Genetic Screen  NIPS:   nml XX   TDaP vaccine    Hgb A1C or  GTT Third trimester :   Rhogam   not needed   LAB RESULTS   Feeding Plan  Blood Type O/Positive/-- (10/11 1005)   Contraception  Antibody Negative (10/11 1005)  Circumcision  Rubella 7.86 (10/11 1005)  Pediatrician   RPR Non Reactive (10/11 1005)   Support Person  HBsAg Negative (10/11 1005)   Prenatal Classes  HIV Non Reactive (10/11 1005)    Varicella   BTL Consent  GBS NEGATIVE/-- (10/27 1824)(For PCN allergy, check sensitivities)        VBAC Consent  Desires repeat Pap  2020 NIL   COVID vaccinated Hgb Electro      CF  SMA         High Risk Pregnancy Diagnoses History of preeclampsia- severe History of cesarean section (low vert) Advanced maternal age History of IUGR  History of premature delivery      Previous Version      Some charting discrepancies regarding low vertical versus transverse hysterotomy. My OR report and the patient's memory are that hse had a transverse incision. There was concern that  a vertical may be needed because of the patient's fibroids but a transverse was able to be performed. Will assume transverse. Scheduled for repeat at 39 weeks.   Gestational age appropriate obstetric precautions including but not limited to vaginal bleeding, contractions, leaking of fluid and fetal movement were reviewed in detail with the patient.    Return in about 2 weeks (around 01/03/2021) for ROB in person and growth [place].  Homero Fellers MD [facility], [facility] 12/20/2020, 11:16 AM

## 2020-12-20 NOTE — Patient Instructions (Signed)
https://www.nichd.nih.gov/health/topics/labor-delivery/Pages/default.aspx">  Third Trimester of Pregnancy  The third trimester of pregnancy is from week 28 through week 40. This is months 7 through 9. The third trimester is a time when the unborn baby (fetus) is growing rapidly. At the end of the ninth month, the fetus is about 20 inches long and weighs 6-10 pounds. Body changes during your third trimester During the third trimester, your body will continue to go through many changes. The changes vary and generally return to normal after your baby is born. Physical changes  Your weight will continue to increase. You can expect to gain 25-35 pounds (11-16 kg) by the end of the pregnancy if you begin pregnancy at a normal weight. If you are underweight, you can expect to gain 28-40 lb (about 13-18 kg), and if you are overweight, you can expect to gain 15-25 lb (about 7-11 kg).  You may begin to get stretch marks on your hips, abdomen, and breasts.  Your breasts will continue to grow and may hurt. A yellow fluid (colostrum) may leak from your breasts. This is the first milk you are producing for your baby.  You may have changes in your hair. These can include thickening of your hair, rapid growth, and changes in texture. Some people also have hair loss during or after pregnancy, or hair that feels dry or thin.  Your belly button may stick out.  You may notice more swelling in your hands, face, or ankles. Health changes  You may have heartburn.  You may have constipation.  You may develop hemorrhoids.  You may develop swollen, bulging veins (varicose veins) in your legs.  You may have increased body aches in the pelvis, back, or thighs. This is due to weight gain and increased hormones that are relaxing your joints.  You may have increased tingling or numbness in your hands, arms, and legs. The skin on your abdomen may also feel numb.  You may feel short of breath because of your  expanding uterus. Other changes  You may urinate more often because the fetus is moving lower into your pelvis and pressing on your bladder.  You may have more problems sleeping. This may be caused by the size of your abdomen, an increased need to urinate, and an increase in your body's metabolism.  You may notice the fetus "dropping," or moving lower in your abdomen (lightening).  You may have increased vaginal discharge.  You may notice that you have pain around your pelvic bone as your uterus distends. Follow these instructions at home: Medicines  Follow your health care provider's instructions regarding medicine use. Specific medicines may be either safe or unsafe to take during pregnancy. Do not take any medicines unless approved by your health care provider.  Take a prenatal vitamin that contains at least 600 micrograms (mcg) of folic acid. Eating and drinking  Eat a healthy diet that includes fresh fruits and vegetables, whole grains, good sources of protein such as meat, eggs, or tofu, and low-fat dairy products.  Avoid raw meat and unpasteurized juice, milk, and cheese. These carry germs that can harm you and your baby.  Eat 4 or 5 small meals rather than 3 large meals a day.  You may need to take these actions to prevent or treat constipation: ? Drink enough fluid to keep your urine pale yellow. ? Eat foods that are high in fiber, such as beans, whole grains, and fresh fruits and vegetables. ? Limit foods that are high in fat and   processed sugars, such as fried or sweet foods. Activity  Exercise only as directed by your health care provider. Most people can continue their usual exercise routine during pregnancy. Try to exercise for 30 minutes at least 5 days a week. Stop exercising if you experience contractions in the uterus.  Stop exercising if you develop pain or cramping in the lower abdomen or lower back.  Avoid heavy lifting.  Do not exercise if it is very hot or  humid or if you are at a high altitude.  If you choose to, you may continue to have sex unless your health care provider tells you not to. Relieving pain and discomfort  Take frequent breaks and rest with your legs raised (elevated) if you have leg cramps or low back pain.  Take warm sitz baths to soothe any pain or discomfort caused by hemorrhoids. Use hemorrhoid cream if your health care provider approves.  Wear a supportive bra to prevent discomfort from breast tenderness.  If you develop varicose veins: ? Wear support hose as told by your health care provider. ? Elevate your feet for 15 minutes, 3-4 times a day. ? Limit salt in your diet. Safety  Talk to your health care provider before traveling far distances.  Do not use hot tubs, steam rooms, or saunas.  Wear your seat belt at all times when driving or riding in a car.  Talk with your health care provider if someone is verbally or physically abusive to you. Preparing for birth To prepare for the arrival of your baby:  Take prenatal classes to understand, practice, and ask questions about labor and delivery.  Visit the hospital and tour the maternity area.  Purchase a rear-facing car seat and make sure you know how to install it in your car.  Prepare the baby's room or sleeping area. Make sure to remove all pillows and stuffed animals from the baby's crib to prevent suffocation. General instructions  Avoid cat litter boxes and soil used by cats. These carry germs that can cause birth defects in the baby. If you have a cat, ask someone to clean the litter box for you.  Do not douche or use tampons. Do not use scented sanitary pads.  Do not use any products that contain nicotine or tobacco, such as cigarettes, e-cigarettes, and chewing tobacco. If you need help quitting, ask your health care provider.  Do not use any herbal remedies, illegal drugs, or medicines that were not prescribed to you. Chemicals in these products  can harm your baby.  Do not drink alcohol.  You will have more frequent prenatal exams during the third trimester. During a routine prenatal visit, your health care provider will do a physical exam, perform tests, and discuss your overall health. Keep all follow-up visits. This is important. Where to find more information  American Pregnancy Association: americanpregnancy.org  American College of Obstetricians and Gynecologists: acog.org/en/Womens%20Health/Pregnancy  Office on Women's Health: womenshealth.gov/pregnancy Contact a health care provider if you have:  A fever.  Mild pelvic cramps, pelvic pressure, or nagging pain in your abdominal area or lower back.  Vomiting or diarrhea.  Bad-smelling vaginal discharge or foul-smelling urine.  Pain when you urinate.  A headache that does not go away when you take medicine.  Visual changes or see spots in front of your eyes. Get help right away if:  Your water breaks.  You have regular contractions less than 5 minutes apart.  You have spotting or bleeding from your vagina.  You   have severe abdominal pain.  You have difficulty breathing.  You have chest pain.  You have fainting spells.  You have not felt your baby move for the time period told by your health care provider.  You have new or increased pain, swelling, or redness in an arm or leg. Summary  The third trimester of pregnancy is from week 28 through week 40 (months 7 through 9).  You may have more problems sleeping. This can be caused by the size of your abdomen, an increased need to urinate, and an increase in your body's metabolism.  You will have more frequent prenatal exams during the third trimester. Keep all follow-up visits. This is important. This information is not intended to replace advice given to you by your health care provider. Make sure you discuss any questions you have with your health care provider. Document Revised: 04/06/2020 Document  Reviewed: 02/11/2020 Elsevier Patient Education  [place].   Cesarean Delivery Cesarean birth, or cesarean delivery, is the surgical delivery of a baby through an incision in the abdomen and the uterus. This may be referred to as a C-section. This procedure may be scheduled ahead of time, or it may be done in an emergency situation. Tell a health care provider about:  Any allergies you have.  All medicines you are taking, including vitamins, herbs, eye drops, creams, and over-the-counter medicines.  Any problems you or family members have had with anesthetic medicines.  Any blood disorders you have.  Any surgeries you have had.  Any medical conditions you have.  Whether you or any members of your family have a history of deep vein thrombosis (DVT) or pulmonary embolism (PE). What are the risks? Generally, this is a safe procedure. However, problems may occur, including:  Infection.  Bleeding.  Allergic reactions to medicines.  Damage to other structures or organs.  Blood clots.  Injury to your baby. What happens before the procedure? General instructions  Follow instructions from your health care provider about eating or drinking restrictions.  If you know that you are going to have a cesarean delivery, do not shave your pubic area. Shaving before the procedure may increase your risk of infection.  Plan to have someone take you home from the hospital.  Ask your health care provider what steps will be taken to prevent infection. These may include: ? Removing hair at the surgery site. ? Washing skin with a germ-killing soap. ? Taking antibiotic medicine.  Depending on the reason for your cesarean delivery, you may have a physical exam or additional testing, such as an ultrasound.  You may have your blood or urine tested. Questions for your health care provider  Ask your health care provider about: ? Changing or stopping your regular medicines. This is  especially important if you are taking diabetes medicines or blood thinners. ? Your pain management plan. This is especially important if you plan to breastfeed your baby. ? How long you will be in the hospital after the procedure. ? Any concerns you may have about receiving blood products, if you need them during the procedure. ? Cord blood banking, if you plan to collect your baby's umbilical cord blood.  You may also want to ask your health care provider: ? Whether you will be able to hold or breastfeed your baby while you are still in the operating room. ? Whether your baby can stay with you immediately after the procedure and during your recovery. ? Whether a family member or a  person of your choice can go with you into the operating room and stay with you during the procedure, immediately after the procedure, and during your recovery. What happens during the procedure?  An IV will be inserted into one of your veins.  Fluid and medicines, such as antibiotics, will be given before the surgery.  Fetal monitors will be placed on your abdomen to check your baby's heart rate.  You may be given a special warming gown to wear to keep your temperature stable.  A catheter may be inserted into your bladder through your urethra. This drains your urine during the procedure.  You may be given one or more of the following: ? A medicine to numb the area (local anesthetic). ? A medicine to make you fall asleep (general anesthetic). ? A medicine (regional anesthetic) that is injected into your back or through a small thin tube placed in your back (spinal anesthetic or epidural anesthetic). This numbs everything below the injection site and allows you to stay awake during your procedure. If this makes you feel nauseous, tell your health care provider. Medicines will be available to help reduce any nausea you may feel.  An incision will be made in your abdomen, and then in your uterus.  If you are  awake during your procedure, you may feel tugging and pulling in your abdomen, but you should not feel pain. If you feel pain, tell your health care provider immediately.  Your baby will be removed from your uterus. You may feel more pressure or pushing while this happens.  Immediately after birth, your baby will be dried and kept warm. You may be able to hold and breastfeed your baby.  The umbilical cord may be clamped and cut during this time. This usually occurs after waiting a period of 1-2 minutes after delivery.  Your placenta will be removed from your uterus.  Your incisions will be closed with stitches (sutures). Staples, skin glue, or adhesive strips may also be applied to the incision in your abdomen.  Bandages (dressings) may be placed over the incision in your abdomen. The procedure may vary among health care providers and hospitals.   What happens after the procedure?  Your blood pressure, heart rate, breathing rate, and blood oxygen level will be monitored until you are discharged from the hospital.  You may continue to receive fluids and medicines through an IV.  You will have some pain. Medicines will be available to help control your pain.  To help prevent blood clots: ? You may be given medicines. ? You may have to wear compression stockings or devices. ? You will be encouraged to walk around when you are able.  Hospital staff will encourage and support bonding with your baby. Your hospital may have you and your baby to stay in the same room (rooming in) during your hospital stay to encourage successful bonding and breastfeeding.  You may be encouraged to cough and breathe deeply often. This helps to prevent lung problems.  If you have a catheter draining your urine, it will be removed as soon as possible after your procedure. Summary  Cesarean birth, or cesarean delivery, is the surgical delivery of a baby through an incision in the abdomen and the  uterus.  Follow instructions from your health care provider about eating or drinking restrictions before the procedure.  You will have some pain after the procedure. Medicines will be available to help control your pain.  Hospital staff will encourage and support bonding  with your baby after the procedure. Your hospital may have you and your baby to stay in the same room (rooming in) during your hospital stay to encourage successful bonding and breastfeeding. This information is not intended to replace advice given to you by your health care provider. Make sure you discuss any questions you have with your health care provider. Document Revised: 06/27/2020 Document Reviewed: 05/05/2018 Elsevier Patient Education  [place].

## 2020-12-29 ENCOUNTER — Telehealth: Payer: Self-pay

## 2020-12-29 NOTE — Telephone Encounter (Signed)
Pt is scheduled for c/s w [facility]  DOS 4/18 @ 7:30 AM  H&P 4/11 @ 9:10 AM  Gigi Gin will be post call and I will request her  assistance in OR  Pre-admit appt requested  Covid test date 4/15 @ 9-10 am

## 2020-12-29 NOTE — Telephone Encounter (Signed)
-----   Message from Homero Fellers, MD sent at 12/20/2020 11:18 AM EST ----- Surgery Booking Request Patient Full Name:  Toni Byrd  MRN: [id number]  DOB: Apr 05, 1981  Surgeon: Homero Fellers, MD  Requested Surgery Date and Time: 02/27/2021 Primary Diagnosis AND Code: history of prior cesarean, breech Secondary Diagnosis and Code:  Surgical Procedure: Cesarean Section RNFA Requested?: No L&D Notification: Yes Admission Status: surgery admit Length of Surgery: 75 min Special Case Needs: No H&P: Yes Phone Interview???:  Yes Interpreter: No Medical Clearance:  No Special Scheduling Instructions: No Any known health/anesthesia issues, diabetes, sleep apnea, latex allergy, defibrillator/pacemaker?: No Acuity: P1   (P1 highest, P2 delay may cause harm, P3 low, elective gyn, P4 lowest)

## 2020-12-30 NOTE — Telephone Encounter (Signed)
Orders placed.

## 2021-01-03 ENCOUNTER — Other Ambulatory Visit: Payer: Self-pay

## 2021-01-03 ENCOUNTER — Ambulatory Visit (INDEPENDENT_AMBULATORY_CARE_PROVIDER_SITE_OTHER): Payer: 59

## 2021-01-03 ENCOUNTER — Ambulatory Visit (INDEPENDENT_AMBULATORY_CARE_PROVIDER_SITE_OTHER): Payer: 59 | Admitting: Obstetrics

## 2021-01-03 VITALS — BP 122/74 | Wt 209.0 lb

## 2021-01-03 DIAGNOSIS — O09299 Supervision of pregnancy with other poor reproductive or obstetric history, unspecified trimester: Secondary | ICD-10-CM | POA: Diagnosis not present

## 2021-01-03 DIAGNOSIS — O0990 Supervision of high risk pregnancy, unspecified, unspecified trimester: Secondary | ICD-10-CM | POA: Diagnosis not present

## 2021-01-03 DIAGNOSIS — Z3A31 31 weeks gestation of pregnancy: Secondary | ICD-10-CM

## 2021-01-03 NOTE — Progress Notes (Signed)
Routine Prenatal Care Visit  Subjective  Toni Byrd is a 40 y.o. [phone] at [redacted]w[redacted]d being seen today for ongoing prenatal care.  She is currently monitored for the following issues for this high-risk pregnancy and has History of recurrent miscarriages; Fibroid uterus; Advanced maternal age, primigravida, antepartum; Uterine fibroids affecting pregnancy; Supervision of high risk pregnancy, antepartum; History of cesarean delivery; Hx of preeclampsia, prior pregnancy, currently pregnant; and History of intrauterine growth restriction in prior pregnancy, currently pregnant on their problem list.  ----------------------------------------------------------------------------------- Patient reports no complaints.    . Vag. Bleeding: None.  Movement: Present. Leaking Fluid denies.  ----------------------------------------------------------------------------------- The following portions of the patient's history were reviewed and updated as appropriate: allergies, current medications, past family history, past medical history, past social history, past surgical history and problem list. Problem list updated.  Objective  Blood pressure 122/74, weight 209 lb (94.8 kg), last menstrual period 05/26/2020, currently breastfeeding. Pregravid weight 204 lb (92.5 kg) Total Weight Gain 5 lb (2.268 kg) Urinalysis: Urine Protein    Urine Glucose    Fetal Status:     Movement: Present     General:  Alert, oriented and cooperative. Patient is in no acute distress.  Skin: Skin is warm and dry. No rash noted.   Cardiovascular: Normal heart rate noted  Respiratory: Normal respiratory effort, no problems with respiration noted  Abdomen: Soft, gravid, appropriate for gestational age. Pain/Pressure: Absent     Pelvic:  Cervical exam deferred        Extremities: Normal range of motion.  Edema: None  Mental Status: Normal mood and affect. Normal behavior. Normal judgment and thought content.   Assessment   40  y.o. [id number] at [redacted]w[redacted]d by  03/02/2021, by Last Menstrual Period presenting for routine prenatal visit  Plan   pregnancy 4 Problems (from 08/22/20 to present)    Problem Noted Resolved   Hx of preeclampsia, prior pregnancy, currently pregnant 10/10/2020 by Will Bonnet, MD No   Overview Addendum 10/10/2020 11:35 AM by Will Bonnet, MD    - severe preeclampsia, delivery at 32 weeks [ ]  bASA      Previous Version   History of intrauterine growth restriction in prior pregnancy, currently pregnant 10/10/2020 by Will Bonnet, MD No   Supervision of high risk pregnancy, antepartum 08/22/2020 by Homero Fellers, MD No   Overview Addendum 12/20/2020 11:14 AM by Homero Fellers, MD     Nursing Staff Provider  Office Location  [facility] Dating   LMP = 12 wk [place]  Language  English Anatomy US    Flu Vaccine   08/22/2020 Genetic Screen  NIPS:   nml XX   TDaP vaccine   12/20/2020 Hgb A1C or  GTT Third trimester : 111  Rhogam   not needed   LAB RESULTS   Feeding Plan  Blood Type O/Positive/-- (10/11 1005)   Contraception  Antibody Negative (10/11 1005)  Circumcision  Rubella 7.86 (10/11 1005)  Pediatrician   RPR Non Reactive (10/11 1005)   Support Person  HBsAg Negative (10/11 1005)   Prenatal Classes  HIV Non Reactive (10/11 1005)    Varicella   BTL Consent  GBS NEGATIVE/-- (10/27 1824)(For PCN allergy, check sensitivities)        VBAC Consent  Desires repeat Pap  2020 NIL   COVID vaccinated Hgb Electro      CF      SMA         High Risk Pregnancy Diagnoses History  of preeclampsia- severe History of cesarean section Advanced maternal age History of IUGR  History of premature delivery      Previous Version       Preterm labor symptoms and general obstetric precautions including but not limited to vaginal bleeding, contractions, leaking of fluid and fetal movement were reviewed in detail with the patient. Please refer to After Visit Summary for other  counseling recommendations.  Growth scan reviewed with her today.  Return in about 2 weeks (around 01/17/2021).  Imagene Riches, CNM  01/03/2021 11:06 AM

## 2021-01-03 NOTE — Progress Notes (Signed)
U/s today. No vb. No lof.

## 2021-01-17 ENCOUNTER — Other Ambulatory Visit: Payer: Self-pay

## 2021-01-17 ENCOUNTER — Ambulatory Visit (INDEPENDENT_AMBULATORY_CARE_PROVIDER_SITE_OTHER): Payer: 59 | Admitting: Obstetrics and Gynecology

## 2021-01-17 ENCOUNTER — Encounter: Payer: Self-pay | Admitting: Obstetrics and Gynecology

## 2021-01-17 VITALS — BP 118/72 | Wt 215.6 lb

## 2021-01-17 DIAGNOSIS — Z3A33 33 weeks gestation of pregnancy: Secondary | ICD-10-CM

## 2021-01-17 DIAGNOSIS — O09299 Supervision of pregnancy with other poor reproductive or obstetric history, unspecified trimester: Secondary | ICD-10-CM

## 2021-01-17 DIAGNOSIS — O0990 Supervision of high risk pregnancy, unspecified, unspecified trimester: Secondary | ICD-10-CM

## 2021-01-17 LAB — POCT URINALYSIS DIPSTICK OB
Glucose, UA: NEGATIVE
POC,PROTEIN,UA: NEGATIVE

## 2021-01-17 NOTE — Patient Instructions (Signed)
Cesarean Delivery Cesarean birth, or cesarean delivery, is the surgical delivery of a baby through an incision in the abdomen and the uterus. This may be referred to as a C-section. This procedure may be scheduled ahead of time, or it may be done in an emergency situation. Tell a health care provider about:  Any allergies you have.  All medicines you are taking, including vitamins, herbs, eye drops, creams, and over-the-counter medicines.  Any problems you or family members have had with anesthetic medicines.  Any blood disorders you have.  Any surgeries you have had.  Any medical conditions you have.  Whether you or any members of your family have a history of deep vein thrombosis (DVT) or pulmonary embolism (PE). What are the risks? Generally, this is a safe procedure. However, problems may occur, including:  Infection.  Bleeding.  Allergic reactions to medicines.  Damage to other structures or organs.  Blood clots.  Injury to your baby. What happens before the procedure? General instructions  Follow instructions from your health care provider about eating or drinking restrictions.  If you know that you are going to have a cesarean delivery, do not shave your pubic area. Shaving before the procedure may increase your risk of infection.  Plan to have someone take you home from the hospital.  Ask your health care provider what steps will be taken to prevent infection. These may include: ? Removing hair at the surgery site. ? Washing skin with a germ-killing soap. ? Taking antibiotic medicine.  Depending on the reason for your cesarean delivery, you may have a physical exam or additional testing, such as an ultrasound.  You may have your blood or urine tested. Questions for your health care provider  Ask your health care provider about: ? Changing or stopping your regular medicines. This is especially important if you are taking diabetes medicines or blood  thinners. ? Your pain management plan. This is especially important if you plan to breastfeed your baby. ? How long you will be in the hospital after the procedure. ? Any concerns you may have about receiving blood products, if you need them during the procedure. ? Cord blood banking, if you plan to collect your baby's umbilical cord blood.  You may also want to ask your health care provider: ? Whether you will be able to hold or breastfeed your baby while you are still in the operating room. ? Whether your baby can stay with you immediately after the procedure and during your recovery. ? Whether a family member or a person of your choice can go with you into the operating room and stay with you during the procedure, immediately after the procedure, and during your recovery. What happens during the procedure?  An IV will be inserted into one of your veins.  Fluid and medicines, such as antibiotics, will be given before the surgery.  Fetal monitors will be placed on your abdomen to check your baby's heart rate.  You may be given a special warming gown to wear to keep your temperature stable.  A catheter may be inserted into your bladder through your urethra. This drains your urine during the procedure.  You may be given one or more of the following: ? A medicine to numb the area (local anesthetic). ? A medicine to make you fall asleep (general anesthetic). ? A medicine (regional anesthetic) that is injected into your back or through a small thin tube placed in your back (spinal anesthetic or epidural anesthetic). This   numbs everything below the injection site and allows you to stay awake during your procedure. If this makes you feel nauseous, tell your health care provider. Medicines will be available to help reduce any nausea you may feel.  An incision will be made in your abdomen, and then in your uterus.  If you are awake during your procedure, you may feel tugging and pulling in your  abdomen, but you should not feel pain. If you feel pain, tell your health care provider immediately.  Your baby will be removed from your uterus. You may feel more pressure or pushing while this happens.  Immediately after birth, your baby will be dried and kept warm. You may be able to hold and breastfeed your baby.  The umbilical cord may be clamped and cut during this time. This usually occurs after waiting a period of 1-2 minutes after delivery.  Your placenta will be removed from your uterus.  Your incisions will be closed with stitches (sutures). Staples, skin glue, or adhesive strips may also be applied to the incision in your abdomen.  Bandages (dressings) may be placed over the incision in your abdomen. The procedure may vary among health care providers and hospitals.   What happens after the procedure?  Your blood pressure, heart rate, breathing rate, and blood oxygen level will be monitored until you are discharged from the hospital.  You may continue to receive fluids and medicines through an IV.  You will have some pain. Medicines will be available to help control your pain.  To help prevent blood clots: ? You may be given medicines. ? You may have to wear compression stockings or devices. ? You will be encouraged to walk around when you are able.  Hospital staff will encourage and support bonding with your baby. Your hospital may have you and your baby to stay in the same room (rooming in) during your hospital stay to encourage successful bonding and breastfeeding.  You may be encouraged to cough and breathe deeply often. This helps to prevent lung problems.  If you have a catheter draining your urine, it will be removed as soon as possible after your procedure. Summary  Cesarean birth, or cesarean delivery, is the surgical delivery of a baby through an incision in the abdomen and the uterus.  Follow instructions from your health care provider about eating or  drinking restrictions before the procedure.  You will have some pain after the procedure. Medicines will be available to help control your pain.  Hospital staff will encourage and support bonding with your baby after the procedure. Your hospital may have you and your baby to stay in the same room (rooming in) during your hospital stay to encourage successful bonding and breastfeeding. This information is not intended to replace advice given to you by your health care provider. Make sure you discuss any questions you have with your health care provider. Document Revised: 06/27/2020 Document Reviewed: 05/05/2018 Elsevier Patient Education  2021 Elsevier Inc.   

## 2021-01-17 NOTE — Progress Notes (Signed)
Routine Prenatal Care Visit  Subjective  Toni Byrd is a 40 y.o. [phone] at [redacted]w[redacted]d being seen today for ongoing prenatal care.  She is currently monitored for the following issues for this high-risk pregnancy and has History of recurrent miscarriages; Fibroid uterus; Advanced maternal age, primigravida, antepartum; Uterine fibroids affecting pregnancy; Supervision of high risk pregnancy, antepartum; History of cesarean delivery; Hx of preeclampsia, prior pregnancy, currently pregnant; and History of intrauterine growth restriction in prior pregnancy, currently pregnant on their problem list.  ----------------------------------------------------------------------------------- Patient reports no complaints.   Contractions: Not present. Vag. Bleeding: None.  Movement: Present. Denies leaking of fluid.  ----------------------------------------------------------------------------------- The following portions of the patient's history were reviewed and updated as appropriate: allergies, current medications, past family history, past medical history, past social history, past surgical history and problem list. Problem list updated.   Objective  Blood pressure 118/72, weight 215 lb 9.6 oz (97.8 kg), last menstrual period 05/26/2020, currently breastfeeding. Pregravid weight 204 lb (92.5 kg) Total Weight Gain 11 lb 9.6 oz (5.262 kg) Urinalysis:      Fetal Status: Fetal Heart Rate (bpm): 140   Movement: Present     General:  Alert, oriented and cooperative. Patient is in no acute distress.  Skin: Skin is warm and dry. No rash noted.   Cardiovascular: Normal heart rate noted  Respiratory: Normal respiratory effort, no problems with respiration noted  Abdomen: Soft, gravid, appropriate for gestational age. Pain/Pressure: Absent     Pelvic:  Cervical exam deferred        Extremities: Normal range of motion.  Edema: None  Mental Status: Normal mood and affect. Normal behavior. Normal judgment  and thought content.     Assessment   40 y.o. [id number] at [redacted]w[redacted]d by  03/02/2021, by Last Menstrual Period presenting for routine prenatal visit  Plan   pregnancy 4 Problems (from 08/22/20 to present)    Problem Noted Resolved   Hx of preeclampsia, prior pregnancy, currently pregnant 10/10/2020 by Will Bonnet, MD No   Overview Addendum 10/10/2020 11:35 AM by Will Bonnet, MD    - severe preeclampsia, delivery at 32 weeks [ ]  bASA      Previous Version   History of intrauterine growth restriction in prior pregnancy, currently pregnant 10/10/2020 by Will Bonnet, MD No   Supervision of high risk pregnancy, antepartum 08/22/2020 by Homero Fellers, MD No   Overview Addendum 01/17/2021  9:56 AM by Homero Fellers, MD     Nursing Staff Provider  Office Location  [facility] Dating   LMP = 12 wk [place]  Language  English Anatomy US    Flu Vaccine   08/22/2020 Genetic Screen  NIPS:   nml XX   TDaP vaccine   12/20/2020 Hgb A1C or  GTT Third trimester : 111  Rhogam   not needed   LAB RESULTS   Feeding Plan  Breast and bottle Blood Type O/Positive/-- (10/11 1005)   Contraception nothing Antibody Negative (10/11 1005)  Circumcision na Rubella 7.86 (10/11 1005)  Pediatrician  [facility] RPR Non Reactive (10/11 1005)   Support Person Husband: EJ HBsAg Negative (10/11 1005)   Prenatal Classes  discussed HIV Non Reactive (10/11 1005)    Varicella  immune  BTL Consent  GBS NEGATIVE/-- (10/27 1824)(For PCN allergy, check sensitivities)        VBAC Consent  Desires repeat Pap  2020 NIL   COVID vaccinated Hgb Electro      CF  SMA         High Risk Pregnancy Diagnoses History of preeclampsia- severe History of cesarean section Advanced maternal age History of IUGR  History of premature delivery      Previous Version      Given note to reduce work hour to 30 hours a week.   Gestational age appropriate obstetric precautions including but not limited to  vaginal bleeding, contractions, leaking of fluid and fetal movement were reviewed in detail with the patient.    Return in about 2 weeks (around 01/31/2021) for [place] in person with MD, and growth [place].  Homero Fellers MD [facility], [facility] 01/17/2021, 9:58 AM

## 2021-01-27 ENCOUNTER — Other Ambulatory Visit: Payer: Self-pay

## 2021-01-27 ENCOUNTER — Ambulatory Visit (INDEPENDENT_AMBULATORY_CARE_PROVIDER_SITE_OTHER): Payer: 59 | Admitting: Obstetrics

## 2021-01-27 VITALS — BP 118/72 | Wt 213.0 lb

## 2021-01-27 DIAGNOSIS — Z3A35 35 weeks gestation of pregnancy: Secondary | ICD-10-CM

## 2021-01-27 DIAGNOSIS — O0990 Supervision of high risk pregnancy, unspecified, unspecified trimester: Secondary | ICD-10-CM

## 2021-01-27 DIAGNOSIS — O09299 Supervision of pregnancy with other poor reproductive or obstetric history, unspecified trimester: Secondary | ICD-10-CM

## 2021-01-27 NOTE — Progress Notes (Signed)
Pain and pressure w/intermittent contractions since last night. Denies leaking of fluid. Baby was less active yesterday.

## 2021-01-27 NOTE — Progress Notes (Signed)
Routine Prenatal Care Visit  Subjective  Toni Byrd is a 40 y.o. [phone] at [redacted]w[redacted]d being seen today for ongoing prenatal care.  She is currently monitored for the following issues for this high-risk pregnancy and has History of recurrent miscarriages; Fibroid uterus; Advanced maternal age, primigravida, antepartum; Uterine fibroids affecting pregnancy; Supervision of high risk pregnancy, antepartum; History of cesarean delivery; Hx of preeclampsia, prior pregnancy, currently pregnant; and History of intrauterine growth restriction in prior pregnancy, currently pregnant on their problem list.  ----------------------------------------------------------------------------------- Patient reports backache, no bleeding, no leaking and this morning she rpeorts decreased fetal movement and some cramping and round ligament pain..   Contractions: Irregular. Vag. Bleeding: None.  Movement: Present. Leaking Fluid denies.  ----------------------------------------------------------------------------------- The following portions of the patient's history were reviewed and updated as appropriate: allergies, current medications, past family history, past medical history, past social history, past surgical history and problem list. Problem list updated.  Objective  Blood pressure 118/72, weight 213 lb (96.6 kg), last menstrual period 05/26/2020, currently breastfeeding. Pregravid weight 204 lb (92.5 kg) Total Weight Gain 9 lb (4.082 kg) Urinalysis: Urine Protein    Urine Glucose    Fetal Status: Fetal Heart Rate (bpm): RNST Fundal Height: 37 cm Movement: Present  Presentation: Vertex  General:  Alert, oriented and cooperative. Patient is in no acute distress.  Skin: Skin is warm and dry. No rash noted.   Cardiovascular: Normal heart rate noted  Respiratory: Normal respiratory effort, no problems with respiration noted  Abdomen: Soft, gravid, appropriate for gestational age. Pain/Pressure: Present      Pelvic:  Cervical exam performed Dilation: Closed Effacement (%): Thick Station: -3cervix very posterior, closed and thick.  Extremities: Normal range of motion.  Edema: None  Mental Status: Normal mood and affect. Normal behavior. Normal judgment and thought content.  NST is reactive, with baseline of 130s and accels present, no decels, acoustic stimulation produces numerous accels. Assessment   40 y.o. [id number] at [redacted]w[redacted]d by  03/02/2021, by Last Menstrual Period presenting for work-in prenatal visit Decreased fetal movement Reactive NST, Category 1 strip  Plan   pregnancy 4 Problems (from 08/22/20 to present)    Problem Noted Resolved   Hx of preeclampsia, prior pregnancy, currently pregnant 10/10/2020 by Will Bonnet, MD No   Overview Addendum 10/10/2020 11:35 AM by Will Bonnet, MD    - severe preeclampsia, delivery at 32 weeks [ ]  bASA      Previous Version   History of intrauterine growth restriction in prior pregnancy, currently pregnant 10/10/2020 by Will Bonnet, MD No   Supervision of high risk pregnancy, antepartum 08/22/2020 by Homero Fellers, MD No   Overview Addendum 01/17/2021  9:56 AM by Homero Fellers, MD     Nursing Staff Provider  Office Location  [facility] Dating   LMP = 12 wk [place]  Language  English Anatomy US    Flu Vaccine   08/22/2020 Genetic Screen  NIPS:   nml XX   TDaP vaccine   12/20/2020 Hgb A1C or  GTT Third trimester : 111  Rhogam   not needed   LAB RESULTS   Feeding Plan  Breast and bottle Blood Type O/Positive/-- (10/11 1005)   Contraception nothing Antibody Negative (10/11 1005)  Circumcision na Rubella 7.86 (10/11 1005)  Pediatrician  [facility] RPR Non Reactive (10/11 1005)   Support Person Husband: EJ HBsAg Negative (10/11 1005)   Prenatal Classes  discussed HIV Non Reactive (10/11 1005)    Varicella  immune  BTL Consent  GBS NEGATIVE/-- (10/27 1824)(For PCN allergy, check sensitivities)        VBAC Consent   Desires repeat Pap  2020 NIL   COVID vaccinated Hgb Electro      CF      SMA         High Risk Pregnancy Diagnoses History of preeclampsia- severe History of cesarean section Advanced maternal age History of IUGR  History of premature delivery      Previous Version       Term labor symptoms and general obstetric precautions including but not limited to vaginal bleeding, contractions, leaking of fluid and fetal movement were reviewed in detail with the patient. Please refer to After Visit Summary for other counseling recommendations. Reviewed her reactive NST today. Home to rest and hydrate. Discussed the increased peovic pressure that is normal at the end of pregnancy. Her urine is dark today- instructed to hydrate at home, take a warm bath.  FKCs recommended.   Return in about 1 week (around 02/03/2021) for  with MD if possible., return OB.  Will meet with MD to discuss her repeat CS. Imagene Riches, CNM  01/27/2021 11:28 AM

## 2021-01-31 ENCOUNTER — Encounter: Payer: 59 | Admitting: Obstetrics

## 2021-01-31 ENCOUNTER — Ambulatory Visit (INDEPENDENT_AMBULATORY_CARE_PROVIDER_SITE_OTHER): Payer: 59

## 2021-01-31 ENCOUNTER — Ambulatory Visit (INDEPENDENT_AMBULATORY_CARE_PROVIDER_SITE_OTHER): Payer: 59 | Admitting: Obstetrics & Gynecology

## 2021-01-31 ENCOUNTER — Other Ambulatory Visit: Payer: Self-pay

## 2021-01-31 ENCOUNTER — Encounter: Payer: Self-pay | Admitting: Obstetrics & Gynecology

## 2021-01-31 VITALS — BP 110/60 | Wt 213.0 lb

## 2021-01-31 DIAGNOSIS — O0993 Supervision of high risk pregnancy, unspecified, third trimester: Secondary | ICD-10-CM

## 2021-01-31 DIAGNOSIS — Z3A35 35 weeks gestation of pregnancy: Secondary | ICD-10-CM

## 2021-01-31 DIAGNOSIS — O0990 Supervision of high risk pregnancy, unspecified, unspecified trimester: Secondary | ICD-10-CM | POA: Diagnosis not present

## 2021-01-31 DIAGNOSIS — O09299 Supervision of pregnancy with other poor reproductive or obstetric history, unspecified trimester: Secondary | ICD-10-CM | POA: Diagnosis not present

## 2021-01-31 DIAGNOSIS — Z98891 History of uterine scar from previous surgery: Secondary | ICD-10-CM

## 2021-01-31 LAB — POCT URINALYSIS DIPSTICK OB
Glucose, UA: NEGATIVE
POC,PROTEIN,UA: NEGATIVE

## 2021-01-31 NOTE — Progress Notes (Signed)
  Subjective  Fetal Movement? yes Contractions? no Leaking Fluid? no Vaginal Bleeding? no  Objective  BP 110/60   Wt 213 lb (96.6 kg)   LMP 05/26/2020   BMI 33.36 kg/m  General: NAD Pumonary: no increased work of breathing Abdomen: gravid, non-tender Extremities: no edema Psychiatric: mood appropriate, affect full  Review of ULTRASOUND.    I have personally reviewed images and report of recent ultrasound done at [facility].    Plan of management to be discussed with patient. Grwoth and AFI normal, Vtx   Assessment  40 y.o. [id number] at [redacted]w[redacted]d by  03/02/2021, by Last Menstrual Period presenting for routine prenatal visit  Plan   Problem List Items Addressed This Visit      Other   Supervision of high risk pregnancy, antepartum   History of cesarean delivery    Other Visit Diagnoses    [redacted] weeks gestation of pregnancy    -  Primary      pregnancy 4 Problems (from 08/22/20 to present)    Problem Noted Resolved   Hx of preeclampsia, prior pregnancy, currently pregnant 10/10/2020 by Will Bonnet, MD No   Overview Addendum 10/10/2020 11:35 AM by Will Bonnet, MD    - severe preeclampsia, delivery at 32 weeks [ ]  bASA      Previous Version   History of intrauterine growth restriction in prior pregnancy, currently pregnant 10/10/2020 by Will Bonnet, MD No   Supervision of high risk pregnancy, antepartum 08/22/2020 by Homero Fellers, MD No   Overview Addendum 01/17/2021  9:56 AM by Homero Fellers, MD     Nursing Staff Provider  Office Location  [facility] Dating   LMP = 12 wk [place]  Language  English Anatomy US    Flu Vaccine   08/22/2020 Genetic Screen  NIPS:   nml XX   TDaP vaccine   12/20/2020 Hgb A1C or  GTT Third trimester : 111  Rhogam   not needed   LAB RESULTS   Feeding Plan  Breast and bottle Blood Type O/Positive/-- (10/11 1005)   Contraception nothing Antibody Negative (10/11 1005)  Circumcision na Rubella 7.86 (10/11 1005)   Pediatrician  [facility] RPR Non Reactive (10/11 1005)   Support Person Husband: EJ HBsAg Negative (10/11 1005)   Prenatal Classes  discussed HIV Non Reactive (10/11 1005)    Varicella  immune  BTL Consent n/a GBS NEGATIVE/-- (10/27 1824)(For PCN allergy, check sensitivities)        VBAC Consent  Desires repeat Pap  2020 NIL   COVID vaccinated Hgb Electro      CF      SMA         High Risk Pregnancy Diagnoses History of preeclampsia- severe History of cesarean section Advanced maternal age History of IUGR  History of premature delivery      PNV, [place], PTL precautions  Plans CS, discussed  Discussed contraception afterwards; encourage no next pregnancy for >18 mos  Plans to breast feed  Barnett Applebaum, MD, [facility], [facility] 01/31/2021  9:10 AM

## 2021-01-31 NOTE — Addendum Note (Signed)
Addended by: Quintella Baton D on: 01/31/2021 09:18 AM   Modules accepted: Orders

## 2021-02-07 ENCOUNTER — Telehealth: Payer: Self-pay

## 2021-02-07 ENCOUNTER — Other Ambulatory Visit: Payer: Self-pay

## 2021-02-07 ENCOUNTER — Encounter: Payer: Self-pay | Admitting: Obstetrics and Gynecology

## 2021-02-07 ENCOUNTER — Ambulatory Visit (INDEPENDENT_AMBULATORY_CARE_PROVIDER_SITE_OTHER): Payer: 59 | Admitting: Obstetrics and Gynecology

## 2021-02-07 ENCOUNTER — Other Ambulatory Visit (HOSPITAL_COMMUNITY)
Admission: RE | Admit: 2021-02-07 | Discharge: 2021-02-07 | Disposition: A | Discharge status: Home / Self Care | Payer: 59 | Source: Ambulatory Visit | Attending: Obstetrics and Gynecology | Admitting: Obstetrics and Gynecology

## 2021-02-07 VITALS — BP 120/70 | Wt 212.8 lb

## 2021-02-07 DIAGNOSIS — O0993 Supervision of high risk pregnancy, unspecified, third trimester: Secondary | ICD-10-CM

## 2021-02-07 DIAGNOSIS — Z3A36 36 weeks gestation of pregnancy: Secondary | ICD-10-CM | POA: Insufficient documentation

## 2021-02-07 LAB — POCT URINALYSIS DIPSTICK OB
Glucose, UA: NEGATIVE
POC,PROTEIN,UA: NEGATIVE

## 2021-02-07 NOTE — Progress Notes (Signed)
Routine Prenatal Care Visit  Subjective  Toni Byrd is a 40 y.o. [phone] at [redacted]w[redacted]d being seen today for ongoing prenatal care.  She is currently monitored for the following issues for this high-risk pregnancy and has History of recurrent miscarriages; Fibroid uterus; Advanced maternal age, primigravida, antepartum; Uterine fibroids affecting pregnancy; Supervision of high risk pregnancy, antepartum; History of cesarean delivery; Hx of preeclampsia, prior pregnancy, currently pregnant; and History of intrauterine growth restriction in prior pregnancy, currently pregnant on their problem list.  ----------------------------------------------------------------------------------- Patient reports no complaints.   Contractions: Not present. Vag. Bleeding: None.  Movement: Present. Denies leaking of fluid.  ----------------------------------------------------------------------------------- The following portions of the patient's history were reviewed and updated as appropriate: allergies, current medications, past family history, past medical history, past social history, past surgical history and problem list. Problem list updated.   Objective  Blood pressure 120/70, weight 212 lb 12.8 oz (96.5 kg), last menstrual period 05/26/2020, currently breastfeeding. Pregravid weight 204 lb (92.5 kg) Total Weight Gain 8 lb 12.8 oz (3.992 kg) Urinalysis:      Fetal Status:     Movement: Present     General:  Alert, oriented and cooperative. Patient is in no acute distress.  Skin: Skin is warm and dry. No rash noted.   Cardiovascular: Normal heart rate noted  Respiratory: Normal respiratory effort, no problems with respiration noted  Abdomen: Soft, gravid, appropriate for gestational age. Pain/Pressure: Present     Pelvic:  Cervical exam deferred        Extremities: Normal range of motion.     Mental Status: Normal mood and affect. Normal behavior. Normal judgment and thought content.      Assessment   40 y.o. [id number] at [redacted]w[redacted]d by  03/02/2021, by Last Menstrual Period presenting for routine prenatal visit  Plan   pregnancy 4 Problems (from 08/22/20 to present)    Problem Noted Resolved   Hx of preeclampsia, prior pregnancy, currently pregnant 10/10/2020 by Will Bonnet, MD No   Overview Addendum 10/10/2020 11:35 AM by Will Bonnet, MD    - severe preeclampsia, delivery at 32 weeks [ ]  bASA      Previous Version   History of intrauterine growth restriction in prior pregnancy, currently pregnant 10/10/2020 by Will Bonnet, MD No   Supervision of high risk pregnancy, antepartum 08/22/2020 by Homero Fellers, MD No   Overview Addendum 01/17/2021  9:56 AM by Homero Fellers, MD     Nursing Staff Provider  Office Location  [facility] Dating   LMP = 12 wk [place]  Language  English Anatomy US    Flu Vaccine   08/22/2020 Genetic Screen  NIPS:   nml XX   TDaP vaccine   12/20/2020 Hgb A1C or  GTT Third trimester : 111  Rhogam   not needed   LAB RESULTS   Feeding Plan  Breast and bottle Blood Type O/Positive/-- (10/11 1005)   Contraception nothing Antibody Negative (10/11 1005)  Circumcision na Rubella 7.86 (10/11 1005)  Pediatrician  [facility] RPR Non Reactive (10/11 1005)   Support Person Husband: EJ HBsAg Negative (10/11 1005)   Prenatal Classes  discussed HIV Non Reactive (10/11 1005)    Varicella  immune  BTL Consent  GBS NEGATIVE/-- (10/27 1824)(For PCN allergy, check sensitivities)        VBAC Consent  Desires repeat Pap  2020 NIL   COVID vaccinated Hgb Electro      CF  SMA         High Risk Pregnancy Diagnoses History of preeclampsia- severe History of cesarean section Advanced maternal age History of IUGR  History of premature delivery      Previous Version      GBS, GC/ CT today  Gestational age appropriate obstetric precautions including but not limited to vaginal bleeding, contractions, leaking of fluid and  fetal movement were reviewed in detail with the patient.    Return in about 1 week (around 02/14/2021) for ROB in person.  Homero Fellers MD [facility], [facility] 02/07/2021, 11:45 AM

## 2021-02-07 NOTE — Telephone Encounter (Signed)
FMLA/DISABILITY form for [employer] filled out, signature obtained and given to [facility] for processing.

## 2021-02-07 NOTE — Patient Instructions (Signed)
Cesarean Delivery Cesarean birth, or cesarean delivery, is the surgical delivery of a baby through an incision in the abdomen and the uterus. This may be referred to as a C-section. This procedure may be scheduled ahead of time, or it may be done in an emergency situation. Tell a health care provider about:  Any allergies you have.  All medicines you are taking, including vitamins, herbs, eye drops, creams, and over-the-counter medicines.  Any problems you or family members have had with anesthetic medicines.  Any blood disorders you have.  Any surgeries you have had.  Any medical conditions you have.  Whether you or any members of your family have a history of deep vein thrombosis (DVT) or pulmonary embolism (PE). What are the risks? Generally, this is a safe procedure. However, problems may occur, including:  Infection.  Bleeding.  Allergic reactions to medicines.  Damage to other structures or organs.  Blood clots.  Injury to your baby. What happens before the procedure? General instructions  Follow instructions from your health care provider about eating or drinking restrictions.  If you know that you are going to have a cesarean delivery, do not shave your pubic area. Shaving before the procedure may increase your risk of infection.  Plan to have someone take you home from the hospital.  Ask your health care provider what steps will be taken to prevent infection. These may include: ? Removing hair at the surgery site. ? Washing skin with a germ-killing soap. ? Taking antibiotic medicine.  Depending on the reason for your cesarean delivery, you may have a physical exam or additional testing, such as an ultrasound.  You may have your blood or urine tested. Questions for your health care provider  Ask your health care provider about: ? Changing or stopping your regular medicines. This is especially important if you are taking diabetes medicines or blood  thinners. ? Your pain management plan. This is especially important if you plan to breastfeed your baby. ? How long you will be in the hospital after the procedure. ? Any concerns you may have about receiving blood products, if you need them during the procedure. ? Cord blood banking, if you plan to collect your baby's umbilical cord blood.  You may also want to ask your health care provider: ? Whether you will be able to hold or breastfeed your baby while you are still in the operating room. ? Whether your baby can stay with you immediately after the procedure and during your recovery. ? Whether a family member or a person of your choice can go with you into the operating room and stay with you during the procedure, immediately after the procedure, and during your recovery. What happens during the procedure?  An IV will be inserted into one of your veins.  Fluid and medicines, such as antibiotics, will be given before the surgery.  Fetal monitors will be placed on your abdomen to check your baby's heart rate.  You may be given a special warming gown to wear to keep your temperature stable.  A catheter may be inserted into your bladder through your urethra. This drains your urine during the procedure.  You may be given one or more of the following: ? A medicine to numb the area (local anesthetic). ? A medicine to make you fall asleep (general anesthetic). ? A medicine (regional anesthetic) that is injected into your back or through a small thin tube placed in your back (spinal anesthetic or epidural anesthetic). This  numbs everything below the injection site and allows you to stay awake during your procedure. If this makes you feel nauseous, tell your health care provider. Medicines will be available to help reduce any nausea you may feel.  An incision will be made in your abdomen, and then in your uterus.  If you are awake during your procedure, you may feel tugging and pulling in your  abdomen, but you should not feel pain. If you feel pain, tell your health care provider immediately.  Your baby will be removed from your uterus. You may feel more pressure or pushing while this happens.  Immediately after birth, your baby will be dried and kept warm. You may be able to hold and breastfeed your baby.  The umbilical cord may be clamped and cut during this time. This usually occurs after waiting a period of 1-2 minutes after delivery.  Your placenta will be removed from your uterus.  Your incisions will be closed with stitches (sutures). Staples, skin glue, or adhesive strips may also be applied to the incision in your abdomen.  Bandages (dressings) may be placed over the incision in your abdomen. The procedure may vary among health care providers and hospitals.   What happens after the procedure?  Your blood pressure, heart rate, breathing rate, and blood oxygen level will be monitored until you are discharged from the hospital.  You may continue to receive fluids and medicines through an IV.  You will have some pain. Medicines will be available to help control your pain.  To help prevent blood clots: ? You may be given medicines. ? You may have to wear compression stockings or devices. ? You will be encouraged to walk around when you are able.  Hospital staff will encourage and support bonding with your baby. Your hospital may have you and your baby to stay in the same room (rooming in) during your hospital stay to encourage successful bonding and breastfeeding.  You may be encouraged to cough and breathe deeply often. This helps to prevent lung problems.  If you have a catheter draining your urine, it will be removed as soon as possible after your procedure. Summary  Cesarean birth, or cesarean delivery, is the surgical delivery of a baby through an incision in the abdomen and the uterus.  Follow instructions from your health care provider about eating or  drinking restrictions before the procedure.  You will have some pain after the procedure. Medicines will be available to help control your pain.  Hospital staff will encourage and support bonding with your baby after the procedure. Your hospital may have you and your baby to stay in the same room (rooming in) during your hospital stay to encourage successful bonding and breastfeeding. This information is not intended to replace advice given to you by your health care provider. Make sure you discuss any questions you have with your health care provider. Document Revised: 06/27/2020 Document Reviewed: 05/05/2018 Elsevier Patient Education  [place].

## 2021-02-07 NOTE — Addendum Note (Signed)
Addended by: Vernia Buff A on: 02/07/2021 12:08 PM   Modules accepted: Orders

## 2021-02-09 LAB — CERVICOVAGINAL ANCILLARY ONLY
Chlamydia: NEGATIVE
Comment: NEGATIVE
Comment: NORMAL
Neisseria Gonorrhea: NEGATIVE

## 2021-02-09 NOTE — Telephone Encounter (Signed)
Called and spoke with patient about taking payment for FMLA. Patient was to call back to make payment

## 2021-02-11 LAB — CULTURE, BETA STREP (GROUP B ONLY): Strep Gp B Culture: NEGATIVE

## 2021-02-14 ENCOUNTER — Encounter: Payer: Self-pay | Admitting: Obstetrics & Gynecology

## 2021-02-14 ENCOUNTER — Other Ambulatory Visit: Payer: Self-pay

## 2021-02-14 ENCOUNTER — Ambulatory Visit (INDEPENDENT_AMBULATORY_CARE_PROVIDER_SITE_OTHER): Payer: 59 | Admitting: Obstetrics & Gynecology

## 2021-02-14 VITALS — BP 100/70 | Wt 216.0 lb

## 2021-02-14 DIAGNOSIS — Z3A37 37 weeks gestation of pregnancy: Secondary | ICD-10-CM

## 2021-02-14 DIAGNOSIS — O0993 Supervision of high risk pregnancy, unspecified, third trimester: Secondary | ICD-10-CM

## 2021-02-14 DIAGNOSIS — Z98891 History of uterine scar from previous surgery: Secondary | ICD-10-CM

## 2021-02-14 LAB — POCT URINALYSIS DIPSTICK OB
Glucose, UA: NEGATIVE
POC,PROTEIN,UA: NEGATIVE

## 2021-02-14 NOTE — Progress Notes (Signed)
  Subjective  Fetal Movement? yes Contractions? occas BHs Leaking Fluid? no Vaginal Bleeding? no  Objective  BP 100/70   Wt 216 lb (98 kg)   LMP 05/26/2020   BMI 33.83 kg/m  General: NAD Pumonary: no increased work of breathing Abdomen: gravid, non-tender Extremities: no edema Psychiatric: mood appropriate, affect full  Assessment  40 y.o. [id number] at [redacted]w[redacted]d by  03/02/2021, by Last Menstrual Period presenting for routine prenatal visit  Plan   Problem List Items Addressed This Visit      Other   History of cesarean delivery    Other Visit Diagnoses    [redacted] weeks gestation of pregnancy    -  Primary   Supervision of high risk pregnancy in third trimester          pregnancy 4 Problems (from 08/22/20 to present)    Problem Noted Resolved   Hx of preeclampsia, prior pregnancy, currently pregnant 10/10/2020 by Will Bonnet, MD No   Overview Addendum 10/10/2020 11:35 AM by Will Bonnet, MD    - severe preeclampsia, delivery at 32 weeks [ ]  bASA      Previous Version   History of intrauterine growth restriction in prior pregnancy, currently pregnant 10/10/2020 by Will Bonnet, MD No   Supervision of high risk pregnancy, antepartum 08/22/2020 by Homero Fellers, MD No   Overview Addendum 01/17/2021  9:56 AM by Homero Fellers, MD     Nursing Staff Provider  Office Location  [facility] Dating   LMP = 12 wk [place]  Language  English Anatomy US    Flu Vaccine   08/22/2020 Genetic Screen  NIPS:   nml XX   TDaP vaccine   12/20/2020 Hgb A1C or  GTT Third trimester : 111  Rhogam   not needed   LAB RESULTS   Feeding Plan  Breast and bottle Blood Type O/Positive/-- (10/11 1005)   Contraception nothing Antibody Negative (10/11 1005)  Circumcision na Rubella 7.86 (10/11 1005)  Pediatrician  [facility] RPR Non Reactive (10/11 1005)   Support Person Husband: EJ HBsAg Negative (10/11 1005)   Prenatal Classes  discussed HIV Non Reactive (10/11 1005)     Varicella  immune  BTL Consent  GBS NEGATIVE/-- (10/27 1824)(For PCN allergy, check sensitivities)        VBAC Consent  Desires repeat Pap  2020 NIL   COVID vaccinated Hgb Electro      CF      SMA         High Risk Pregnancy Diagnoses History of preeclampsia- severe History of cesarean section Advanced maternal age History of IUGR  History of premature delivery      Previous Version     CS planned, discussed options if labor ensues prior to 4/18  PNV, [facility]  Barnett Applebaum, MD, [facility], [facility] 02/14/2021  11:30 AM

## 2021-02-14 NOTE — Patient Instructions (Signed)

## 2021-02-14 NOTE — Addendum Note (Signed)
Addended by: Quintella Baton D on: 02/14/2021 11:45 AM   Modules accepted: Orders

## 2021-02-16 ENCOUNTER — Telehealth: Payer: Self-pay

## 2021-02-16 NOTE — Telephone Encounter (Signed)
FMLA/DISABILITY additional information for [employer] filled out, signature obtained and given to [facility] for processing.

## 2021-02-20 ENCOUNTER — Ambulatory Visit (INDEPENDENT_AMBULATORY_CARE_PROVIDER_SITE_OTHER): Payer: 59 | Admitting: Obstetrics and Gynecology

## 2021-02-20 ENCOUNTER — Encounter: Payer: Self-pay | Admitting: Obstetrics and Gynecology

## 2021-02-20 ENCOUNTER — Other Ambulatory Visit: Payer: Self-pay

## 2021-02-20 VITALS — BP 124/70 | Ht 67.0 in | Wt 215.4 lb

## 2021-02-20 DIAGNOSIS — Z3A38 38 weeks gestation of pregnancy: Secondary | ICD-10-CM

## 2021-02-20 LAB — POCT URINALYSIS DIPSTICK OB
Glucose, UA: NEGATIVE
POC,PROTEIN,UA: NEGATIVE

## 2021-02-20 NOTE — H&P (View-Only) (Signed)
Toni Byrd is an 40 y.o. female.   Chief Complaint: history of prior cesarean HPI: She is feeling well. No complaints. Reports normal fetal movements. Denies leakage of fluid. Denies contractions. Denies vaginal bleeding.   FHR: 150 bpm Fundal height: 37 cm   pregnancy 4 Problems (from 08/22/20 to present)    Problem Noted Resolved   Hx of preeclampsia, prior pregnancy, currently pregnant 10/10/2020 by Will Bonnet, MD No   Overview Addendum 10/10/2020 11:35 AM by Will Bonnet, MD    - severe preeclampsia, delivery at 32 weeks [ ]  bASA      Previous Version   History of intrauterine growth restriction in prior pregnancy, currently pregnant 10/10/2020 by Will Bonnet, MD No   Supervision of high risk pregnancy, antepartum 08/22/2020 by Homero Fellers, MD No   Overview Addendum 01/17/2021  9:56 AM by Homero Fellers, MD     Nursing Staff Provider  Office Location  [facility] Dating   LMP = 12 wk [place]  Language  English Anatomy US    Flu Vaccine   08/22/2020 Genetic Screen  NIPS:   nml XX   TDaP vaccine   12/20/2020 Hgb A1C or  GTT Third trimester : 111  Rhogam   not needed   LAB RESULTS   Feeding Plan  Breast and bottle Blood Type O/Positive/-- (10/11 1005)   Contraception nothing Antibody Negative (10/11 1005)  Circumcision na Rubella 7.86 (10/11 1005)  Pediatrician  [facility] RPR Non Reactive (10/11 1005)   Support Person Husband: EJ HBsAg Negative (10/11 1005)   Prenatal Classes  discussed HIV Non Reactive (10/11 1005)    Varicella  immune  BTL Consent  GBS NEGATIVE/-- (10/27 1824)(For PCN allergy, check sensitivities)        VBAC Consent  Desires repeat Pap  2020 NIL   COVID vaccinated Hgb Electro      CF      SMA         High Risk Pregnancy Diagnoses History of preeclampsia- severe History of cesarean section Advanced maternal age History of IUGR  History of premature delivery      Previous Version       Past Medical  History:  Diagnosis Date  . Bacterial vaginitis   . Dysmenorrhea   . Fibroid   . Hydrosalpinx   . Spontaneous abortion   . Trichomoniasis     Past Surgical History:  Procedure Laterality Date  . CESAREAN SECTION N/A 09/09/2019   Procedure: CESAREAN SECTION;  Surgeon: Homero Fellers, MD;  Location: [facility] ORS;  Service: Obstetrics;  Laterality: N/A;  . DILATATION & CURETTAGE/HYSTEROSCOPY WITH MYOSURE N/A 01/23/2018   Procedure: DILATATION & CURETTAGE/HYSTEROSCOPY WITH MYOSURE,REMOVAL OF UTERINE LESION;  Surgeon: Will Bonnet, MD;  Location: [facility] ORS;  Service: Gynecology;  Laterality: N/A;  . LASIK      Family History  Problem Relation Age of Onset  . Rheum arthritis Mother   . Hypothyroidism Mother   . Hypertension Mother   . Thyroid disease Mother   . Diabetes Father   . Prostate cancer Father 62  . Breast cancer Maternal Grandmother 80   Social History:  reports that she has never smoked. She has never used smokeless tobacco. She reports previous alcohol use. She reports that she does not use drugs.  Allergies:  Allergies  Allergen Reactions  . Other Rash    ADHESIVE FROM BANDAIDS-"BURNS SKIN"    (Not in a hospital admission)   No results  found for this or any previous visit (from the past 48 hour(s)). No results found.  Review of Systems  Constitutional: Negative for chills and fever.  HENT: Negative for congestion, hearing loss and sinus pain.   Respiratory: Negative for cough, shortness of breath and wheezing.   Cardiovascular: Negative for chest pain, palpitations and leg swelling.  Gastrointestinal: Negative for abdominal pain, constipation, diarrhea, nausea and vomiting.  Genitourinary: Negative for dysuria, flank pain, frequency, hematuria and urgency.  Musculoskeletal: Negative for back pain.  Skin: Negative for rash.  Neurological: Negative for dizziness and headaches.  Psychiatric/Behavioral: Negative for suicidal ideas. The patient is not  nervous/anxious.     Blood pressure 124/70, height 5\' 7"  (1.702 m), weight 215 lb 6.4 oz (97.7 kg), last menstrual period 05/26/2020, currently breastfeeding. Physical Exam Vitals and nursing note reviewed.  Constitutional:      Appearance: She is well-developed.  HENT:     Head: Normocephalic and atraumatic.  Cardiovascular:     Rate and Rhythm: Normal rate and regular rhythm.  Pulmonary:     Effort: Pulmonary effort is normal.     Breath sounds: Normal breath sounds.  Abdominal:     General: Bowel sounds are normal.     Palpations: Abdomen is soft.  Musculoskeletal:        General: Normal range of motion.  Skin:    General: Skin is warm and dry.  Neurological:     Mental Status: She is alert and oriented to person, place, and time.  Psychiatric:        Behavior: Behavior normal.        Thought Content: Thought content normal.        Judgment: Judgment normal.      Assessment/Plan  39 y.o. [id number] at [redacted]w[redacted]d with history of prior cesarean section who desires an elective repeat cesarean section.   Discussed risks, benefits, and alternatives with the patient for the cesarean section.  Discussed the risk of infection bleeding and damage to surrounding pelvic tissues including but not limited to the uterus, fallopian tubes, ovaries, bowel, and bladder.  Discussed proper care for the incision postoperatively to avoid infection and signs and symptoms of infection to watch for.  Discussed possibility of an emergent blood transfusion for heavy blood loss.  Discussed risks associated with blood transfusion including transfusion reaction and chronic infections, or sepsis which could lead to death.  Patient gave consent for the procedure and blood transfusion if needed. Consent forms were completed.  More than 20 minutes were spent face to face with the patient in the room, reviewing the medical record, labs and images, and coordinating care for the patient. The plan of management was  discussed in detail and counseling was provided.    Homero Fellers, MD 02/20/2021, 9:43 AM

## 2021-02-20 NOTE — Patient Instructions (Signed)
Cesarean Delivery Cesarean birth, or cesarean delivery, is the surgical delivery of a baby through an incision in the abdomen and the uterus. This may be referred to as a C-section. This procedure may be scheduled ahead of time, or it may be done in an emergency situation. Tell a health care provider about:  Any allergies you have.  All medicines you are taking, including vitamins, herbs, eye drops, creams, and over-the-counter medicines.  Any problems you or family members have had with anesthetic medicines.  Any blood disorders you have.  Any surgeries you have had.  Any medical conditions you have.  Whether you or any members of your family have a history of deep vein thrombosis (DVT) or pulmonary embolism (PE). What are the risks? Generally, this is a safe procedure. However, problems may occur, including:  Infection.  Bleeding.  Allergic reactions to medicines.  Damage to other structures or organs.  Blood clots.  Injury to your baby. What happens before the procedure? General instructions  Follow instructions from your health care provider about eating or drinking restrictions.  If you know that you are going to have a cesarean delivery, do not shave your pubic area. Shaving before the procedure may increase your risk of infection.  Plan to have someone take you home from the hospital.  Ask your health care provider what steps will be taken to prevent infection. These may include: ? Removing hair at the surgery site. ? Washing skin with a germ-killing soap. ? Taking antibiotic medicine.  Depending on the reason for your cesarean delivery, you may have a physical exam or additional testing, such as an ultrasound.  You may have your blood or urine tested. Questions for your health care provider  Ask your health care provider about: ? Changing or stopping your regular medicines. This is especially important if you are taking diabetes medicines or blood  thinners. ? Your pain management plan. This is especially important if you plan to breastfeed your baby. ? How long you will be in the hospital after the procedure. ? Any concerns you may have about receiving blood products, if you need them during the procedure. ? Cord blood banking, if you plan to collect your baby's umbilical cord blood.  You may also want to ask your health care provider: ? Whether you will be able to hold or breastfeed your baby while you are still in the operating room. ? Whether your baby can stay with you immediately after the procedure and during your recovery. ? Whether a family member or a person of your choice can go with you into the operating room and stay with you during the procedure, immediately after the procedure, and during your recovery. What happens during the procedure?  An IV will be inserted into one of your veins.  Fluid and medicines, such as antibiotics, will be given before the surgery.  Fetal monitors will be placed on your abdomen to check your baby's heart rate.  You may be given a special warming gown to wear to keep your temperature stable.  A catheter may be inserted into your bladder through your urethra. This drains your urine during the procedure.  You may be given one or more of the following: ? A medicine to numb the area (local anesthetic). ? A medicine to make you fall asleep (general anesthetic). ? A medicine (regional anesthetic) that is injected into your back or through a small thin tube placed in your back (spinal anesthetic or epidural anesthetic). This  numbs everything below the injection site and allows you to stay awake during your procedure. If this makes you feel nauseous, tell your health care provider. Medicines will be available to help reduce any nausea you may feel.  An incision will be made in your abdomen, and then in your uterus.  If you are awake during your procedure, you may feel tugging and pulling in your  abdomen, but you should not feel pain. If you feel pain, tell your health care provider immediately.  Your baby will be removed from your uterus. You may feel more pressure or pushing while this happens.  Immediately after birth, your baby will be dried and kept warm. You may be able to hold and breastfeed your baby.  The umbilical cord may be clamped and cut during this time. This usually occurs after waiting a period of 1-2 minutes after delivery.  Your placenta will be removed from your uterus.  Your incisions will be closed with stitches (sutures). Staples, skin glue, or adhesive strips may also be applied to the incision in your abdomen.  Bandages (dressings) may be placed over the incision in your abdomen. The procedure may vary among health care providers and hospitals.   What happens after the procedure?  Your blood pressure, heart rate, breathing rate, and blood oxygen level will be monitored until you are discharged from the hospital.  You may continue to receive fluids and medicines through an IV.  You will have some pain. Medicines will be available to help control your pain.  To help prevent blood clots: ? You may be given medicines. ? You may have to wear compression stockings or devices. ? You will be encouraged to walk around when you are able.  Hospital staff will encourage and support bonding with your baby. Your hospital may have you and your baby to stay in the same room (rooming in) during your hospital stay to encourage successful bonding and breastfeeding.  You may be encouraged to cough and breathe deeply often. This helps to prevent lung problems.  If you have a catheter draining your urine, it will be removed as soon as possible after your procedure. Summary  Cesarean birth, or cesarean delivery, is the surgical delivery of a baby through an incision in the abdomen and the uterus.  Follow instructions from your health care provider about eating or  drinking restrictions before the procedure.  You will have some pain after the procedure. Medicines will be available to help control your pain.  Hospital staff will encourage and support bonding with your baby after the procedure. Your hospital may have you and your baby to stay in the same room (rooming in) during your hospital stay to encourage successful bonding and breastfeeding. This information is not intended to replace advice given to you by your health care provider. Make sure you discuss any questions you have with your health care provider. Document Revised: 06/27/2020 Document Reviewed: 05/05/2018 Elsevier Patient Education  2021 [place].   Cesarean Delivery, Care After This sheet gives you information about how to care for yourself after your procedure. Your health care provider may also give you more specific instructions. If you have problems or questions, contact your health care provider. What can I expect after the procedure? After the procedure, it is common to have:  A small amount of blood or clear fluid coming from the incision.  Some redness, swelling, and pain in your incision area.  Some abdominal pain and soreness.  Vaginal bleeding (  lochia). Even though you did not have a vaginal delivery, you will still have vaginal bleeding and discharge.  Pelvic cramps.  Fatigue. You may have pain, swelling, and discomfort in the tissue between your vagina and your anus (perineum) if:  Your C-section was unplanned, and you were allowed to labor and push.  An incision was made in the area (episiotomy) or the tissue tore during attempted vaginal delivery. Follow these instructions at home: Incision care  Follow instructions from your health care provider about how to take care of your incision. Make sure you: ? Wash your hands with soap and water before you change your bandage (dressing). If soap and water are not available, use hand sanitizer. ? If you have a  dressing, change it or remove it as told by your health care provider. ? Leave stitches (sutures), skin staples, skin glue, or adhesive strips in place. These skin closures may need to stay in place for 2 weeks or longer. If adhesive strip edges start to loosen and curl up, you may trim the loose edges. Do not remove adhesive strips completely unless your health care provider tells you to do that.  Check your incision area every day for signs of infection. Check for: ? More redness, swelling, or pain. ? More fluid or blood. ? Warmth. ? Pus or a bad smell.  Do not take baths, swim, or use a hot tub until your health care provider says it's okay. Ask your health care provider if you can take showers.  When you cough or sneeze, hug a pillow. This helps with pain and decreases the chance of your incision opening up (dehiscing). Do this until your incision heals.   Medicines  Take over-the-counter and prescription medicines only as told by your health care provider.  If you were prescribed an antibiotic medicine, take it as told by your health care provider. Do not stop taking the antibiotic even if you start to feel better.  Do not drive or use heavy machinery while taking prescription pain medicine. Lifestyle  Do not drink alcohol. This is especially important if you are breastfeeding or taking pain medicine.  Do not use any products that contain nicotine or tobacco, such as cigarettes, e-cigarettes, and chewing tobacco. If you need help quitting, ask your health care provider. Eating and drinking  Drink at least 8 eight-ounce glasses of water every day unless told not to by your health care provider. If you breastfeed, you may need to drink even more water.  Eat high-fiber foods every day. These foods may help prevent or relieve constipation. High-fiber foods include: ? Whole grain cereals and breads. ? Brown rice. ? Beans. ? Fresh fruits and vegetables. Activity  If possible, have  someone help you care for your baby and help with household activities for at least a few days after you leave the hospital.  Return to your normal activities as told by your health care provider. Ask your health care provider what activities are safe for you.  Rest as much as possible. Try to rest or take a nap while your baby is sleeping.  Do not lift anything that is heavier than 10 lbs (4.5 kg), or the limit that you were told, until your health care provider says that it is safe.  Talk with your health care provider about when you can engage in sexual activity. This may depend on your: ? Risk of infection. ? How fast you heal. ? Comfort and desire to engage in sexual  activity.   General instructions  Do not use tampons or douches until your health care provider approves.  Wear loose, comfortable clothing and a supportive and well-fitting bra.  Keep your perineum clean and dry. Wipe from front to back when you use the toilet.  If you pass a blood clot, save it and call your health care provider to discuss. Do not flush blood clots down the toilet before you get instructions from your health care provider.  Keep all follow-up visits for you and your baby as told by your health care provider. This is important. Contact a health care provider if:  You have: ? A fever. ? Bad-smelling vaginal discharge. ? Pus or a bad smell coming from your incision. ? Difficulty or pain when urinating. ? A sudden increase or decrease in the frequency of your bowel movements. ? More redness, swelling, or pain around your incision. ? More fluid or blood coming from your incision. ? A rash. ? Nausea. ? Little or no interest in activities you used to enjoy. ? Questions about caring for yourself or your baby.  Your incision feels warm to the touch.  Your breasts turn red or become painful or hard.  You feel unusually sad or worried.  You vomit.  You pass a blood clot from your vagina.  You  urinate more than usual.  You are dizzy or light-headed. Get help right away if:  You have: ? Pain that does not go away or get better with medicine. ? Chest pain. ? Difficulty breathing. ? Blurred vision or spots in your vision. ? Thoughts about hurting yourself or your baby. ? New pain in your abdomen or in one of your legs. ? A severe headache.  You faint.  You bleed from your vagina so much that you fill more than one sanitary pad in one hour. Bleeding should not be heavier than your heaviest period. Summary  After the procedure, it is common to have pain at your incision site, abdominal cramping, and slight bleeding from your vagina.  Check your incision area every day for signs of infection.  Tell your health care provider about any unusual symptoms.  Keep all follow-up visits for you and your baby as told by your health care provider. This information is not intended to replace advice given to you by your health care provider. Make sure you discuss any questions you have with your health care provider. Document Revised: 05/07/2018 Document Reviewed: 05/07/2018 Elsevier Patient Education  [place].

## 2021-02-20 NOTE — Progress Notes (Signed)
Toni Byrd is an 40 y.o. female.   Chief Complaint: history of prior cesarean HPI: She is feeling well. No complaints. Reports normal fetal movements. Denies leakage of fluid. Denies contractions. Denies vaginal bleeding.   FHR: 150 bpm Fundal height: 37 cm   pregnancy 4 Problems (from 08/22/20 to present)    Problem Noted Resolved   Hx of preeclampsia, prior pregnancy, currently pregnant 10/10/2020 by Will Bonnet, MD No   Overview Addendum 10/10/2020 11:35 AM by Will Bonnet, MD    - severe preeclampsia, delivery at 32 weeks [ ]  bASA      Previous Version   History of intrauterine growth restriction in prior pregnancy, currently pregnant 10/10/2020 by Will Bonnet, MD No   Supervision of high risk pregnancy, antepartum 08/22/2020 by Homero Fellers, MD No   Overview Addendum 01/17/2021  9:56 AM by Homero Fellers, MD     Nursing Staff Provider  Office Location  [facility] Dating   LMP = 12 wk [place]  Language  English Anatomy US    Flu Vaccine   08/22/2020 Genetic Screen  NIPS:   nml XX   TDaP vaccine   12/20/2020 Hgb A1C or  GTT Third trimester : 111  Rhogam   not needed   LAB RESULTS   Feeding Plan  Breast and bottle Blood Type O/Positive/-- (10/11 1005)   Contraception nothing Antibody Negative (10/11 1005)  Circumcision na Rubella 7.86 (10/11 1005)  Pediatrician  [facility] RPR Non Reactive (10/11 1005)   Support Person Husband: [facility] HBsAg Negative (10/11 1005)   Prenatal Classes  discussed HIV Non Reactive (10/11 1005)    Varicella  immune  BTL Consent  GBS NEGATIVE/-- (10/27 1824)(For PCN allergy, check sensitivities)        VBAC Consent  Desires repeat Pap  2020 NIL   COVID vaccinated Hgb Electro      CF      SMA         High Risk Pregnancy Diagnoses History of preeclampsia- severe History of cesarean section Advanced maternal age History of IUGR  History of premature delivery      Previous Version       Past Medical  History:  Diagnosis Date  . Bacterial vaginitis   . Dysmenorrhea   . Fibroid   . Hydrosalpinx   . Spontaneous abortion   . Trichomoniasis     Past Surgical History:  Procedure Laterality Date  . CESAREAN SECTION N/A 09/09/2019   Procedure: CESAREAN SECTION;  Surgeon: Homero Fellers, MD;  Location: [facility] ORS;  Service: Obstetrics;  Laterality: N/A;  . DILATATION & CURETTAGE/HYSTEROSCOPY WITH MYOSURE N/A 01/23/2018   Procedure: DILATATION & CURETTAGE/HYSTEROSCOPY WITH MYOSURE,REMOVAL OF UTERINE LESION;  Surgeon: Will Bonnet, MD;  Location: [facility] ORS;  Service: Gynecology;  Laterality: N/A;  . LASIK      Family History  Problem Relation Age of Onset  . Rheum arthritis Mother   . Hypothyroidism Mother   . Hypertension Mother   . Thyroid disease Mother   . Diabetes Father   . Prostate cancer Father 66  . Breast cancer Maternal Grandmother 80   Social History:  reports that she has never smoked. She has never used smokeless tobacco. She reports previous alcohol use. She reports that she does not use drugs.  Allergies:  Allergies  Allergen Reactions  . Other Rash    ADHESIVE FROM BANDAIDS-"BURNS SKIN"    (Not in a hospital admission)   No results  found for this or any previous visit (from the past 48 hour(s)). No results found.  Review of Systems  Constitutional: Negative for chills and fever.  HENT: Negative for congestion, hearing loss and sinus pain.   Respiratory: Negative for cough, shortness of breath and wheezing.   Cardiovascular: Negative for chest pain, palpitations and leg swelling.  Gastrointestinal: Negative for abdominal pain, constipation, diarrhea, nausea and vomiting.  Genitourinary: Negative for dysuria, flank pain, frequency, hematuria and urgency.  Musculoskeletal: Negative for back pain.  Skin: Negative for rash.  Neurological: Negative for dizziness and headaches.  Psychiatric/Behavioral: Negative for suicidal ideas. The patient is not  nervous/anxious.     Blood pressure 124/70, height 5\' 7"  (1.702 m), weight 215 lb 6.4 oz (97.7 kg), last menstrual period 05/26/2020, currently breastfeeding. Physical Exam Vitals and nursing note reviewed.  Constitutional:      Appearance: She is well-developed.  HENT:     Head: Normocephalic and atraumatic.  Cardiovascular:     Rate and Rhythm: Normal rate and regular rhythm.  Pulmonary:     Effort: Pulmonary effort is normal.     Breath sounds: Normal breath sounds.  Abdominal:     General: Bowel sounds are normal.     Palpations: Abdomen is soft.  Musculoskeletal:        General: Normal range of motion.  Skin:    General: Skin is warm and dry.  Neurological:     Mental Status: She is alert and oriented to person, place, and time.  Psychiatric:        Behavior: Behavior normal.        Thought Content: Thought content normal.        Judgment: Judgment normal.      Assessment/Plan  40 y.o. [id number] at [redacted]w[redacted]d with history of prior cesarean section who desires an elective repeat cesarean section.   Discussed risks, benefits, and alternatives with the patient for the cesarean section.  Discussed the risk of infection bleeding and damage to surrounding pelvic tissues including but not limited to the uterus, fallopian tubes, ovaries, bowel, and bladder.  Discussed proper care for the incision postoperatively to avoid infection and signs and symptoms of infection to watch for.  Discussed possibility of an emergent blood transfusion for heavy blood loss.  Discussed risks associated with blood transfusion including transfusion reaction and chronic infections, or sepsis which could lead to death.  Patient gave consent for the procedure and blood transfusion if needed. Consent forms were completed.  More than 20 minutes were spent face to face with the patient in the room, reviewing the medical record, labs and images, and coordinating care for the patient. The plan of management was  discussed in detail and counseling was provided.    Homero Fellers, MD 02/20/2021, 9:43 AM

## 2021-02-21 ENCOUNTER — Other Ambulatory Visit
Admission: RE | Admit: 2021-02-21 | Discharge: 2021-02-21 | Disposition: A | Discharge status: Home / Self Care | Payer: 59 | Source: Ambulatory Visit | Attending: Obstetrics and Gynecology | Admitting: Obstetrics and Gynecology

## 2021-02-21 ENCOUNTER — Other Ambulatory Visit: Admission: RE | Admit: 2021-02-21 | Payer: 59 | Source: Ambulatory Visit

## 2021-02-21 HISTORY — DX: Anemia, unspecified: D64.9

## 2021-02-21 NOTE — Patient Instructions (Signed)
Your procedure is scheduled on: Monday February 27, 2021. Report to Day Surgery inside [facility] 2nd floor (stop by admissions desk first before getting on elevator). To find out your arrival time please call [phone] between 1PM - 3PM on Friday February 24, 2021.  Remember: Instructions that are not followed completely may result in serious medical risk,  up to and including death, or upon the discretion of your surgeon and anesthesiologist your  surgery may need to be rescheduled.     _X__ 1. Do not eat food or drink fluids after midnight the night before your procedure.                 No chewing gum or hard candies.   __X__2.  On the morning of surgery brush your teeth with toothpaste and water, you                may rinse your mouth with mouthwash if you wish.  Do not swallow any toothpaste of mouthwash.     _X__ 3.  No Alcohol for 24 hours before or after surgery.   _X__ 4.  Do Not Smoke or use e-cigarettes For 24 Hours Prior to Your Surgery.                 Do not use any chewable tobacco products for at least 6 hours prior to                 Surgery.  _X__  5.  Do not use any recreational drugs (marijuana, cocaine, heroin, ecstasy, MDMA or other)                For at least one week prior to your surgery.  Combination of these drugs with anesthesia                May have life threatening results.  __X__ 6.  Notify your doctor if there is any change in your medical condition      (cold, fever, infections).     Do not wear jewelry, make-up, hairpins, clips or nail polish. Do not wear lotions, powders, or perfumes. You may wear deodorant. Do not shave 48 hours prior to surgery.  Do not bring valuables to the hospital.    [facility] is not responsible for any belongings or valuables.  Contacts, dentures or bridgework may not be worn into surgery. Leave your suitcase in the car. After surgery it may be brought to your room. For patients admitted to  the hospital, discharge time is determined by your treatment team.   Patients discharged the day of surgery will not be allowed to drive home.   Make arrangements for someone to be with you for the first 24 hours of your Same Day Discharge.    __X__ Take these medicines the morning of surgery with A SIP OF WATER:    1. None   2.   3.   4.  5.  6.  ____ Fleet Enema (as directed)   __X__ Use CHG Soap (or wipes) as directed  ____ Use Benzoyl Peroxide Gel as instructed  ____ Use inhalers on the day of surgery  ____ Stop metformin 2 days prior to surgery    ____ Take 1/2 of usual insulin dose the night before surgery. No insulin the morning          of surgery.   __X__ Stop Anti-inflammatories such as Ibuprofen, Aleve, Advil, naproxen, Goody's or BC powders.  __X__ Stop supplements until after surgery.    __X__ Do not start any herbal supplements before your procedure.    If you have any questions regarding your pre-procedure instructions,  Please call Pre-admit Testing at [phone].

## 2021-02-24 ENCOUNTER — Other Ambulatory Visit: Payer: 59

## 2021-02-24 ENCOUNTER — Other Ambulatory Visit: Payer: Self-pay

## 2021-02-24 ENCOUNTER — Other Ambulatory Visit
Admission: RE | Admit: 2021-02-24 | Discharge: 2021-02-24 | Disposition: A | Discharge status: Home / Self Care | Payer: 59 | Source: Ambulatory Visit | Attending: Obstetrics and Gynecology | Admitting: Obstetrics and Gynecology

## 2021-02-24 DIAGNOSIS — Z01812 Encounter for preprocedural laboratory examination: Secondary | ICD-10-CM | POA: Insufficient documentation

## 2021-02-24 DIAGNOSIS — Z20822 Contact with and (suspected) exposure to covid-19: Secondary | ICD-10-CM | POA: Insufficient documentation

## 2021-02-24 LAB — RAPID HIV SCREEN (HIV 1/2 AB+AG)
HIV 1/2 Antibodies: NONREACTIVE
HIV-1 P24 Antigen - HIV24: NONREACTIVE

## 2021-02-24 LAB — CBC
HCT: 37.2 % (ref 36.0–46.0)
Hemoglobin: 12.2 g/dL (ref 12.0–15.0)
MCH: 27.9 pg (ref 26.0–34.0)
MCHC: 32.8 g/dL (ref 30.0–36.0)
MCV: 85.1 fL (ref 80.0–100.0)
Platelets: 200 10*3/uL (ref 150–400)
RBC: 4.37 MIL/uL (ref 3.87–5.11)
RDW: 14.2 % (ref 11.5–15.5)
WBC: 7.1 10*3/uL (ref 4.0–10.5)
nRBC: 0 % (ref 0.0–0.2)

## 2021-02-24 LAB — TYPE AND SCREEN
ABO/RH(D): O POS
Antibody Screen: NEGATIVE
Extend sample reason: UNDETERMINED

## 2021-02-24 LAB — SARS CORONAVIRUS 2 (TAT 6-24 HRS): SARS Coronavirus 2: NEGATIVE

## 2021-02-25 LAB — RPR: RPR Ser Ql: NONREACTIVE

## 2021-02-27 ENCOUNTER — Inpatient Hospital Stay: Payer: 59 | Admitting: Certified Registered Nurse Anesthetist

## 2021-02-27 ENCOUNTER — Inpatient Hospital Stay
Admission: RE | Admit: 2021-02-27 | Discharge: 2021-03-01 | DRG: 787 | Disposition: A | Discharge status: Home / Self Care | Payer: 59 | Attending: Obstetrics and Gynecology | Admitting: Obstetrics and Gynecology

## 2021-02-27 ENCOUNTER — Other Ambulatory Visit: Payer: Self-pay

## 2021-02-27 ENCOUNTER — Encounter: Payer: Self-pay | Admitting: Obstetrics and Gynecology

## 2021-02-27 ENCOUNTER — Encounter
Admission: RE | Disposition: A | Discharge status: Home / Self Care | Payer: Self-pay | Source: Home / Self Care | Attending: Obstetrics and Gynecology

## 2021-02-27 DIAGNOSIS — O9081 Anemia of the puerperium: Secondary | ICD-10-CM | POA: Diagnosis not present

## 2021-02-27 DIAGNOSIS — Z98891 History of uterine scar from previous surgery: Secondary | ICD-10-CM

## 2021-02-27 DIAGNOSIS — Z3A39 39 weeks gestation of pregnancy: Secondary | ICD-10-CM

## 2021-02-27 DIAGNOSIS — Z3A38 38 weeks gestation of pregnancy: Secondary | ICD-10-CM

## 2021-02-27 DIAGNOSIS — D62 Acute posthemorrhagic anemia: Secondary | ICD-10-CM | POA: Diagnosis not present

## 2021-02-27 DIAGNOSIS — O34211 Maternal care for low transverse scar from previous cesarean delivery: Principal | ICD-10-CM | POA: Diagnosis present

## 2021-02-27 DIAGNOSIS — Z20822 Contact with and (suspected) exposure to covid-19: Secondary | ICD-10-CM | POA: Diagnosis present

## 2021-02-27 DIAGNOSIS — O321XX0 Maternal care for breech presentation, not applicable or unspecified: Secondary | ICD-10-CM | POA: Diagnosis present

## 2021-02-27 SURGERY — Surgical Case
Anesthesia: Spinal

## 2021-02-27 MED ORDER — IBUPROFEN 800 MG PO TABS
800.0000 mg | ORAL_TABLET | Freq: Three times a day (TID) | ORAL | Status: DC
  Administered 2021-02-28 – 2021-03-01 (×3): 800 mg via ORAL
  Filled 2021-02-27 (×3): qty 1

## 2021-02-27 MED ORDER — FLEET ENEMA 7-19 GM/118ML RE ENEM: 1.0000 | ENEMA | Freq: Every day | RECTAL | Status: DC | PRN

## 2021-02-27 MED ORDER — FAMOTIDINE 20 MG PO TABS
20.0000 mg | ORAL_TABLET | Freq: Once | ORAL | Status: AC
  Administered 2021-02-27: 20 mg via ORAL
  Filled 2021-02-27: qty 1

## 2021-02-27 MED ORDER — SODIUM CHLORIDE 0.9% FLUSH: 3.0000 mL | INTRAVENOUS | Status: DC | PRN

## 2021-02-27 MED ORDER — ORAL CARE MOUTH RINSE: 15.0000 mL | Freq: Once | OROMUCOSAL | Status: DC

## 2021-02-27 MED ORDER — NALOXONE HCL 0.4 MG/ML IJ SOLN: 0.4000 mg | INTRAMUSCULAR | Status: DC | PRN

## 2021-02-27 MED ORDER — COCONUT OIL OIL: 1.0000 "application " | TOPICAL_OIL | Status: DC | PRN

## 2021-02-27 MED ORDER — ACETAMINOPHEN 500 MG PO TABS: 1000.0000 mg | ORAL_TABLET | Freq: Four times a day (QID) | ORAL | Status: DC

## 2021-02-27 MED ORDER — MORPHINE SULFATE (PF) 0.5 MG/ML IJ SOLN
INTRAMUSCULAR | Status: AC
  Filled 2021-02-27: qty 10

## 2021-02-27 MED ORDER — CHLORHEXIDINE GLUCONATE 0.12% ORAL RINSE (MEDLINE KIT): 15.0000 mL | Freq: Once | OROMUCOSAL | Status: DC

## 2021-02-27 MED ORDER — FENTANYL CITRATE (PF) 100 MCG/2ML IJ SOLN: 25.0000 ug | INTRAMUSCULAR | Status: DC | PRN

## 2021-02-27 MED ORDER — PRENATAL MULTIVITAMIN CH
1.0000 | ORAL_TABLET | Freq: Every day | ORAL | Status: DC
  Administered 2021-02-28 – 2021-03-01 (×2): 1 via ORAL
  Filled 2021-02-27 (×2): qty 1

## 2021-02-27 MED ORDER — BUPIVACAINE ON-Q PAIN PUMP (FOR ORDER SET NO CHG)
INJECTION | Status: DC
  Filled 2021-02-27: qty 1

## 2021-02-27 MED ORDER — FERROUS SULFATE 325 (65 FE) MG PO TABS
325.0000 mg | ORAL_TABLET | Freq: Two times a day (BID) | ORAL | Status: DC
  Administered 2021-02-27 – 2021-03-01 (×4): 325 mg via ORAL
  Filled 2021-02-27 (×4): qty 1

## 2021-02-27 MED ORDER — NALBUPHINE HCL 10 MG/ML IJ SOLN
5.0000 mg | Freq: Once | INTRAMUSCULAR | Status: DC | PRN
Start: 2021-02-27 — End: 2021-02-27

## 2021-02-27 MED ORDER — HYDROMORPHONE HCL 1 MG/ML IJ SOLN: 0.2000 mg | INTRAMUSCULAR | Status: DC | PRN

## 2021-02-27 MED ORDER — FENTANYL CITRATE (PF) 100 MCG/2ML IJ SOLN
INTRAMUSCULAR | Status: DC | PRN
  Administered 2021-02-27: 15 ug via INTRATHECAL

## 2021-02-27 MED ORDER — OXYTOCIN-SODIUM CHLORIDE 30-0.9 UT/500ML-% IV SOLN
INTRAVENOUS | Status: DC | PRN
  Administered 2021-02-27: 300 mL via INTRAVENOUS

## 2021-02-27 MED ORDER — FENTANYL CITRATE (PF) 100 MCG/2ML IJ SOLN
INTRAMUSCULAR | Status: AC
  Filled 2021-02-27: qty 2

## 2021-02-27 MED ORDER — SOD CITRATE-CITRIC ACID 500-334 MG/5ML PO SOLN
30.0000 mL | ORAL | Status: AC
  Administered 2021-02-27: 30 mL via ORAL
  Filled 2021-02-27: qty 15

## 2021-02-27 MED ORDER — NALBUPHINE HCL 10 MG/ML IJ SOLN: 5.0000 mg | INTRAMUSCULAR | Status: DC | PRN

## 2021-02-27 MED ORDER — NALBUPHINE HCL 10 MG/ML IJ SOLN: 5.0000 mg | Freq: Once | INTRAMUSCULAR | Status: DC | PRN

## 2021-02-27 MED ORDER — ACETAMINOPHEN 10 MG/ML IV SOLN
INTRAVENOUS | Status: DC | PRN
  Administered 2021-02-27: 1000 mg via INTRAVENOUS

## 2021-02-27 MED ORDER — ZOLPIDEM TARTRATE 5 MG PO TABS: 5.0000 mg | ORAL_TABLET | Freq: Every evening | ORAL | Status: DC | PRN

## 2021-02-27 MED ORDER — KETOROLAC TROMETHAMINE 30 MG/ML IJ SOLN
INTRAMUSCULAR | Status: DC | PRN
  Administered 2021-02-27: 30 mg via INTRAVENOUS

## 2021-02-27 MED ORDER — NALOXONE HCL 4 MG/10ML IJ SOLN
1.0000 ug/kg/h | INTRAVENOUS | Status: DC | PRN
  Filled 2021-02-27: qty 5

## 2021-02-27 MED ORDER — BISACODYL 10 MG RE SUPP: 10.0000 mg | Freq: Every day | RECTAL | Status: DC | PRN

## 2021-02-27 MED ORDER — SIMETHICONE 80 MG PO CHEW: 80.0000 mg | CHEWABLE_TABLET | ORAL | Status: DC | PRN

## 2021-02-27 MED ORDER — BUPIVACAINE HCL (PF) 0.5 % IJ SOLN
INTRAMUSCULAR | Status: AC
  Filled 2021-02-27: qty 30

## 2021-02-27 MED ORDER — SODIUM CHLORIDE 0.9 % IV SOLN
INTRAVENOUS | Status: DC | PRN
  Administered 2021-02-27: 50 ug/min via INTRAVENOUS

## 2021-02-27 MED ORDER — ONDANSETRON HCL 4 MG/2ML IJ SOLN: 4.0000 mg | Freq: Once | INTRAMUSCULAR | Status: DC | PRN

## 2021-02-27 MED ORDER — OXYTOCIN-SODIUM CHLORIDE 30-0.9 UT/500ML-% IV SOLN
2.5000 [IU]/h | INTRAVENOUS | Status: DC
  Administered 2021-02-27 (×2): 2.5 [IU]/h via INTRAVENOUS

## 2021-02-27 MED ORDER — OXYTOCIN-SODIUM CHLORIDE 30-0.9 UT/500ML-% IV SOLN
INTRAVENOUS | Status: AC
  Filled 2021-02-27: qty 1000

## 2021-02-27 MED ORDER — ONDANSETRON HCL 4 MG/2ML IJ SOLN
INTRAMUSCULAR | Status: DC | PRN
  Administered 2021-02-27: 4 mg via INTRAVENOUS

## 2021-02-27 MED ORDER — LACTATED RINGERS IV SOLN: INTRAVENOUS | Status: DC

## 2021-02-27 MED ORDER — MEPERIDINE HCL 25 MG/ML IJ SOLN
6.2500 mg | INTRAMUSCULAR | Status: DC | PRN
Start: 2021-02-27 — End: 2021-02-27

## 2021-02-27 MED ORDER — PHENYLEPHRINE HCL (PRESSORS) 10 MG/ML IV SOLN
INTRAVENOUS | Status: AC
  Filled 2021-02-27: qty 1

## 2021-02-27 MED ORDER — ACETAMINOPHEN 10 MG/ML IV SOLN
INTRAVENOUS | Status: AC
  Filled 2021-02-27: qty 100

## 2021-02-27 MED ORDER — BUPIVACAINE HCL (PF) 0.5 % IJ SOLN
INTRAMUSCULAR | Status: DC | PRN
  Administered 2021-02-27: 10 mL

## 2021-02-27 MED ORDER — SIMETHICONE 80 MG PO CHEW
80.0000 mg | CHEWABLE_TABLET | Freq: Three times a day (TID) | ORAL | Status: DC
  Administered 2021-02-27 – 2021-03-01 (×5): 80 mg via ORAL
  Filled 2021-02-27 (×6): qty 1

## 2021-02-27 MED ORDER — ONDANSETRON HCL 4 MG/2ML IJ SOLN
4.0000 mg | Freq: Three times a day (TID) | INTRAMUSCULAR | Status: DC | PRN
  Administered 2021-02-27: 4 mg via INTRAVENOUS
  Filled 2021-02-27: qty 2

## 2021-02-27 MED ORDER — KETOROLAC TROMETHAMINE 30 MG/ML IJ SOLN: 30.0000 mg | Freq: Four times a day (QID) | INTRAMUSCULAR | Status: AC | PRN

## 2021-02-27 MED ORDER — ACETAMINOPHEN 500 MG PO TABS
1000.0000 mg | ORAL_TABLET | Freq: Four times a day (QID) | ORAL | Status: AC
  Administered 2021-02-27 – 2021-02-28 (×3): 1000 mg via ORAL
  Filled 2021-02-27 (×3): qty 2

## 2021-02-27 MED ORDER — MENTHOL 3 MG MT LOZG
1.0000 | LOZENGE | OROMUCOSAL | Status: DC | PRN
  Filled 2021-02-27: qty 9

## 2021-02-27 MED ORDER — LACTATED RINGERS IV SOLN
INTRAVENOUS | Status: DC
  Administered 2021-02-27: 125 mL/h via INTRAVENOUS

## 2021-02-27 MED ORDER — DIBUCAINE (PERIANAL) 1 % EX OINT: 1.0000 "application " | TOPICAL_OINTMENT | CUTANEOUS | Status: DC | PRN

## 2021-02-27 MED ORDER — IBUPROFEN 800 MG PO TABS: 800.0000 mg | ORAL_TABLET | Freq: Three times a day (TID) | ORAL | Status: DC

## 2021-02-27 MED ORDER — DIPHENHYDRAMINE HCL 50 MG/ML IJ SOLN: 12.5000 mg | INTRAMUSCULAR | Status: DC | PRN

## 2021-02-27 MED ORDER — KETOROLAC TROMETHAMINE 30 MG/ML IJ SOLN
30.0000 mg | Freq: Four times a day (QID) | INTRAMUSCULAR | Status: AC | PRN
  Administered 2021-02-27 – 2021-02-28 (×3): 30 mg via INTRAVENOUS
  Filled 2021-02-27 (×3): qty 1

## 2021-02-27 MED ORDER — CEFAZOLIN SODIUM-DEXTROSE 2-4 GM/100ML-% IV SOLN
2.0000 g | INTRAVENOUS | Status: AC
  Administered 2021-02-27: 2 g via INTRAVENOUS
  Filled 2021-02-27: qty 100

## 2021-02-27 MED ORDER — BUPIVACAINE IN DEXTROSE 0.75-8.25 % IT SOLN
INTRATHECAL | Status: DC | PRN
  Administered 2021-02-27: 1.7 mL via INTRATHECAL

## 2021-02-27 MED ORDER — WITCH HAZEL-GLYCERIN EX PADS: 1.0000 "application " | MEDICATED_PAD | CUTANEOUS | Status: DC | PRN

## 2021-02-27 MED ORDER — SENNOSIDES-DOCUSATE SODIUM 8.6-50 MG PO TABS
2.0000 | ORAL_TABLET | ORAL | Status: DC
  Administered 2021-02-28 – 2021-03-01 (×2): 2 via ORAL
  Filled 2021-02-27 (×2): qty 2

## 2021-02-27 MED ORDER — DIPHENHYDRAMINE HCL 25 MG PO CAPS: 25.0000 mg | ORAL_CAPSULE | ORAL | Status: DC | PRN

## 2021-02-27 MED ORDER — OXYCODONE HCL 5 MG PO TABS
5.0000 mg | ORAL_TABLET | ORAL | Status: DC | PRN
Start: 2021-02-27 — End: 2021-03-01
  Administered 2021-02-28 – 2021-03-01 (×4): 10 mg via ORAL
  Filled 2021-02-27 (×4): qty 2

## 2021-02-27 MED ORDER — MORPHINE SULFATE (PF) 0.5 MG/ML IJ SOLN
INTRAMUSCULAR | Status: DC | PRN
  Administered 2021-02-27: .1 mg via INTRATHECAL

## 2021-02-27 MED ORDER — DIPHENHYDRAMINE HCL 25 MG PO CAPS: 25.0000 mg | ORAL_CAPSULE | Freq: Four times a day (QID) | ORAL | Status: DC | PRN

## 2021-02-27 MED ORDER — BUPIVACAINE 0.25 % ON-Q PUMP DUAL CATH 400 ML
400.0000 mL | INJECTION | Status: DC
  Filled 2021-02-27: qty 400

## 2021-02-27 SURGICAL SUPPLY — 26 items
CELL SAVER LIPIGURD (MISCELLANEOUS) ×1 IMPLANT
CHLORAPREP W/TINT 26 (MISCELLANEOUS) ×4 IMPLANT
DERMABOND ADVANCED (GAUZE/BANDAGES/DRESSINGS) ×1
DERMABOND ADVANCED .7 DNX12 (GAUZE/BANDAGES/DRESSINGS) ×1 IMPLANT
DRSG OPSITE POSTOP 4X10 (GAUZE/BANDAGES/DRESSINGS) ×2 IMPLANT
ELECT CAUTERY BLADE 6.4 (BLADE) ×2 IMPLANT
ELECT REM PT RETURN 9FT ADLT (ELECTROSURGICAL) ×2
ELECTRODE REM PT RTRN 9FT ADLT (ELECTROSURGICAL) ×1 IMPLANT
EXTRT SYSTEM ALEXIS 14CM (MISCELLANEOUS) ×2
GLOVE SURG UNDER POLY LF SZ6.5 (GLOVE) ×4 IMPLANT
GOWN STRL REUS W/ TWL LRG LVL3 (GOWN DISPOSABLE) ×1 IMPLANT
GOWN STRL REUS W/ TWL XL LVL3 (GOWN DISPOSABLE) ×2 IMPLANT
GOWN STRL REUS W/TWL LRG LVL3 (GOWN DISPOSABLE) ×2
GOWN STRL REUS W/TWL XL LVL3 (GOWN DISPOSABLE) ×4
MANIFOLD NEPTUNE II (INSTRUMENTS) ×2 IMPLANT
MAT PREVALON FULL STRYKER (MISCELLANEOUS) ×2 IMPLANT
NS IRRIG 1000ML POUR BTL (IV SOLUTION) ×2 IMPLANT
PACK C SECTION AR (MISCELLANEOUS) ×2 IMPLANT
PAD OB MATERNITY 4.3X12.25 (PERSONAL CARE ITEMS) ×2 IMPLANT
PAD PREP 24X41 OB/GYN DISP (PERSONAL CARE ITEMS) ×2 IMPLANT
PENCIL SMOKE EVACUATOR (MISCELLANEOUS) ×2 IMPLANT
SUT MNCRL AB 4-0 PS2 18 (SUTURE) ×2 IMPLANT
SUT PLAIN 3-0 (SUTURE) ×2 IMPLANT
SUT VIC AB 0 CT1 36 (SUTURE) ×6 IMPLANT
SUT VIC AB 2-0 CT1 36 (SUTURE) ×2 IMPLANT
SYR 30ML LL (SYRINGE) ×4 IMPLANT

## 2021-02-27 NOTE — Discharge Summary (Signed)
Postpartum Discharge Summary  Date of Service updated: 03/01/2021     Patient Name: Toni Byrd DOB: 13-Feb-1981 MRN: [id number]  Date of admission: 02/27/2021 Delivery date:02/27/2021  Delivering provider: Adrian Prows R  Date of discharge: 03/01/2021  Admitting diagnosis: History of cesarean section [Z98.891] Intrauterine pregnancy: [redacted]w[redacted]d    Secondary diagnosis:  Active Problems:   History of cesarean section   [redacted] weeks gestation of pregnancy   Postpartum care following cesarean delivery   Encounter for care or examination of lactating mother  Additional problems: none    Discharge diagnosis: Term Pregnancy Delivered                                              Post partum procedures:none Augmentation: N/A Complications: None  Hospital course: Sceduled C/S   40y.o. yo [id number] admitted to the hospital 02/27/2021 for scheduled cesarean section with the following indication:Elective Repeat.Delivery details are as follows:  Membrane Rupture Time/Date: 8:22 AM ,02/27/2021   Delivery Method:C-Section, Low Transverse  Details of operation can be found in separate operative note.  Patient had an uncomplicated postpartum course.  She is ambulating, tolerating a regular diet, passing flatus, and urinating well. Patient is discharged home in stable condition on  03/01/21        Newborn Data: Birth date:02/27/2021  Birth time:8:23 AM  Gender:Female  Zoe Living status:Living  Apgars:8 ,9  Weight:3180 g     Magnesium Sulfate received: No BMZ received: No Rhophylac:No MMR:No T-DaP:Given prenatally Flu: No Transfusion:No  Physical exam  Vitals:   02/28/21 0755 02/28/21 1740 02/28/21 2306 03/01/21 0741  BP: 108/68 121/69 118/80 108/63  Pulse: 84 89 94 86  Resp: 18 18 20 20   Temp: 97.7 F (36.5 C) 98.4 F (36.9 C) 98.9 F (37.2 C) 98.5 F (36.9 C)  TempSrc: Oral Oral  Oral  SpO2: 98% 99% 98% 99%  Weight:      Height:       General: alert,  cooperative and no distress Lochia: appropriate Uterine Fundus: firm Incision: Healing well with no significant drainage DVT Evaluation: No evidence of DVT seen on physical exam. Labs: Lab Results  Component Value Date   WBC 8.4 02/28/2021   HGB 10.4 (L) 02/28/2021   HCT 31.9 (L) 02/28/2021   MCV 85.8 02/28/2021   PLT 144 (L) 02/28/2021   CMP Latest Ref Rng & Units 08/22/2020  Glucose 65 - 99 mg/dL 86  BUN 6 - 20 mg/dL 4(L)  Creatinine 0.57 - 1.00 mg/dL 0.43(L)  Sodium 134 - 144 mmol/L 137  Potassium 3.5 - 5.2 mmol/L 3.8  Chloride 96 - 106 mmol/L 102  CO2 20 - 29 mmol/L 20  Calcium 8.7 - 10.2 mg/dL 9.4  Total Protein 6.0 - 8.5 g/dL 6.5  Total Bilirubin 0.0 - 1.2 mg/dL 0.2  Alkaline Phos 44 - 121 IU/L 72  AST 0 - 40 IU/L 11  ALT 0 - 32 IU/L 12   Edinburgh Score: Edinburgh Postnatal Depression Scale Screening Tool 02/27/2021  I have been able to laugh and see the funny side of things. 0  I have looked forward with enjoyment to things. 0  I have blamed myself unnecessarily when things went wrong. 0  I have been anxious or worried for no good reason. 0  I have felt scared or panicky for no  good reason. 0  Things have been getting on top of me. 0  I have been so unhappy that I have had difficulty sleeping. 0  I have felt sad or miserable. 0  I have been so unhappy that I have been crying. 0  The thought of harming myself has occurred to me. 0  Edinburgh Postnatal Depression Scale Total 0      After visit meds:  Allergies as of 03/01/2021      Reactions   Other Rash   ADHESIVE FROM BANDAIDS-"BURNS SKIN"      Medication List    STOP taking these medications   aspirin EC 81 MG tablet   ferrous sulfate 325 (65 FE) MG tablet   vitamin C 1000 MG tablet     TAKE these medications   ibuprofen 800 MG tablet Commonly known as: ADVIL Take 1 tablet (800 mg total) by mouth every 8 (eight) hours.   multivitamin-prenatal 27-0.8 MG Tabs tablet Take 1 tablet by mouth  daily.   oxyCODONE 5 MG immediate release tablet Commonly known as: Oxy IR/ROXICODONE Take 1 tablet (5 mg total) by mouth every 6 (six) hours as needed for up to 5 days for moderate pain or severe pain.            Discharge Care Instructions  (From admission, onward)         Start     Ordered   03/01/21 0000  Discharge wound care:       Comments: Keep incision dry, clean.   03/01/21 0925           Discharge home in stable condition Infant Feeding: Breast Infant Disposition:home with mother Discharge instruction: per After Visit Summary and Postpartum booklet. Activity: Advance as tolerated. Pelvic rest for 6 weeks.  Diet: routine diet Anticipated Birth Control: Condoms Postpartum Appointment:1 week Additional Postpartum F/U: Incision check 1 week Future Appointments: Future Appointments  Date Time Provider [place]  03/06/2021 10:10 AM Schuman, Stefanie Libel, MD [facility] None   Follow up Visit:  Follow-up Information    Schuman, Stefanie Libel, MD. Go in 1 week(s).   Specialty: Obstetrics and Gynecology Why: incision check Contact information: [address] [place] [address] [phone]                 03/01/2021 Rod Can, CNM

## 2021-02-27 NOTE — Lactation Note (Signed)
This note was copied from a baby's chart. Lactation Consultation Note  Patient Name: Girl Darrien Laakso EHUDJ'S Date: 02/27/2021 Reason for consult: Initial assessment;Term Age:38 hours   LC Student arrived in the room where MOB and FOB were present. Mob was concerned about how often she should feed the infant. [place] Student used the postpartum booklet provided to the parents to inform them about supply and demand, input and output, normal feeding patterns, newborn stomach size, spitting up, and milk storage guidelines. MOB was also taught hand expression and was able to express colostrum easily. Infant began to cue so MOB decided to try and feed infant. [place] Student helped mother adjust infant to the football position using support pillows. Infant was latched well, swallows were heard, and the infant had flanged lips. MOB reported she felt a tugging feeling. MOB stated she was comfortable and did not have any further questions or concerns. Infant was still latched upon [employer] leaving the room.   Maternal Data Has patient been taught Hand Expression?: Yes Does the patient have breastfeeding experience prior to this delivery?: Yes How long did the patient breastfeed?: a few months  Feeding Mother's Current Feeding Choice: Breast Milk (mother plans to feed baby breastmilk in a bottle)  LATCH Score Latch: Repeated attempts needed to sustain latch, nipple held in mouth throughout feeding, stimulation needed to elicit sucking reflex.  Audible Swallowing: Spontaneous and intermittent  Type of Nipple: Everted at rest and after stimulation  Comfort (Breast/Nipple): Soft / non-tender  Hold (Positioning): Assistance needed to correctly position infant at breast and maintain latch.  LATCH Score: 8  Interventions Interventions: Breast feeding basics reviewed;Assisted with latch;Breast massage;Hand express;Position options;Support pillows;Adjust position  Discharge Pump: Personal  Consult  Status Consult Status: Follow-up Date: 02/28/21    Daisey Caloca Tamala Julian 02/27/2021, 1:19 PM

## 2021-02-27 NOTE — Anesthesia Preprocedure Evaluation (Signed)
Anesthesia Evaluation  Patient identified by MRN, date of birth, ID band Patient awake    Reviewed: Allergy & Precautions, H&P , NPO status , Patient's Chart, lab work & pertinent test results, reviewed documented beta blocker date and time   Airway Mallampati: III  TM Distance: >3 FB Neck ROM: full    Dental no notable dental hx. (+) Teeth Intact   Pulmonary neg pulmonary ROS, Current Smoker,    Pulmonary exam normal breath sounds clear to auscultation       Cardiovascular Exercise Tolerance: Good negative cardio ROS   Rhythm:regular Rate:Normal     Neuro/Psych negative neurological ROS  negative psych ROS   GI/Hepatic negative GI ROS, Neg liver ROS,   Endo/Other  negative endocrine ROSdiabetes, Well Controlled, Oral Hypoglycemic Agents  Renal/GU      Musculoskeletal   Abdominal   Peds  Hematology  (+) Blood dyscrasia, anemia ,   Anesthesia Other Findings   Reproductive/Obstetrics (+) Pregnancy                             Anesthesia Physical Anesthesia Plan  ASA: II  Anesthesia Plan: Spinal   Post-op Pain Management:    Induction:   PONV Risk Score and Plan:   Airway Management Planned:   Additional Equipment:   Intra-op Plan:   Post-operative Plan:   Informed Consent: I have reviewed the patients History and Physical, chart, labs and discussed the procedure including the risks, benefits and alternatives for the proposed anesthesia with the patient or authorized representative who has indicated his/her understanding and acceptance.       Plan Discussed with:   Anesthesia Plan Comments: (Pt with prolonged but spontaneously resolving epidural after last preg. Risks of same discussed and accepted with sab.  )        Anesthesia Quick Evaluation

## 2021-02-27 NOTE — Transfer of Care (Signed)
Immediate Anesthesia Transfer of Care Note  Patient: Toni Byrd  Procedure(s) Performed: CESAREAN SECTION (N/A )  Patient Location: PACU  Anesthesia Type:Spinal  Level of Consciousness: awake, alert  and oriented  Airway & Oxygen Therapy: Patient Spontanous Breathing  Post-op Assessment: Report given to RN and Post -op Vital signs reviewed and stable  Post vital signs: Reviewed and stable  Last Vitals:  Vitals Value Taken Time  BP 112/67 02/27/21 0935  Temp    Pulse 83 02/27/21 0935  Resp 23 02/27/21 0935  SpO2 100 % 02/27/21 0935    Last Pain:  Vitals:   02/27/21 0712  TempSrc:   PainSc: 0-No pain         Complications: No complications documented.

## 2021-02-27 NOTE — Anesthesia Procedure Notes (Signed)
Date/Time: 02/27/2021 7:48 AM Performed by: Johnna Acosta, CRNA Pre-anesthesia Checklist: Patient identified, Emergency Drugs available, Suction available, Patient being monitored and Timeout performed Patient Re-evaluated:Patient Re-evaluated prior to induction Oxygen Delivery Method: Nasal cannula Preoxygenation: Pre-oxygenation with 100% oxygen

## 2021-02-27 NOTE — Anesthesia Procedure Notes (Signed)
Spinal  Patient location during procedure: OR Start time: 02/27/2021 7:55 AM End time: 02/27/2021 8:00 AM Reason for block: surgical anesthesia Staffing Performed: resident/CRNA  Anesthesiologist: Molli Barrows, MD Resident/CRNA: Johnna Acosta, CRNA Preanesthetic Checklist Completed: patient identified, IV checked, site marked, risks and benefits discussed, surgical consent, monitors and equipment checked, pre-op evaluation and timeout performed Spinal Block Patient position: sitting Prep: ChloraPrep Patient monitoring: heart rate, continuous pulse ox, blood pressure and cardiac monitor Approach: midline Location: L3-4 Injection technique: single-shot Needle Needle type: Whitacre and Introducer  Needle gauge: 24 G Needle length: 9 cm Assessment Sensory level: T10 Events: CSF return Additional Notes Negative paresthesia. Negative blood return. Positive free-flowing CSF. Expiration date of kit checked and confirmed. Patient tolerated procedure well, without complications.

## 2021-02-27 NOTE — Interval H&P Note (Signed)
History and Physical Interval Note:  02/27/2021 7:33 AM  Toni Byrd [name]  has presented today for surgery, with the diagnosis of history of prior cesarean, breech.  The various methods of treatment have been discussed with the patient and family. After consideration of risks, benefits and other options for treatment, the patient has consented to  Procedure(s): CESAREAN SECTION (N/A) as a surgical intervention.  The patient's history has been reviewed, patient examined, no change in status, stable for surgery.  I have reviewed the patient's chart and labs.  Questions were answered to the patient's satisfaction.     [name]

## 2021-02-27 NOTE — Op Note (Signed)
Cesarean Section Procedure Note 02/27/21  Pre-operative Diagnosis:  1. History of prior cesarean section  2. [redacted] week gestation Post-operative Diagnosis: same, delivered. Procedure: Repeat Low Transverse Cesarean Section  Surgeon: Adrian Prows MD  Assistant(s): Gigi Gin CNM - No other skilled surgical assistant available. Anesthesia: spinal Estimated Blood Loss: 341 cc Complications: None; patient tolerated the procedure well.  Disposition: PACU - hemodynamically stable. Condition: stable   Findings: A female infant in the cephalic presentation. Amniotic fluid - clear   Birth weight: 7 lbs 0oz Apgars of 8 and 9.  Intact placenta with a three-vessel cord. Grossly normal uterus, tubes and ovaries bilaterally. No intraabdominal adhesions were noted.   Procedure Details    The patient was taken to operating room, identified as the correct patient and the procedure verified as C-Section Delivery. A time out was held and the above information confirmed. After induction of anesthesia, the patient was draped and prepped in the usual sterile manner. A Pfannenstiel incision was made and carried down through the subcutaneous tissue to the fascia. Fascial incision was made and extended transversely with the Mayo scissors. The fascia was separated from the underlying rectus tissue superiorly and inferiorly. The peritoneum was identified and entered bluntly. Peritoneal incision was extended longitudinally. A low transverse hysterotomy was made. The fetus was delivered atraumatically. The umbilical cord was clamped x2 and cut and the infant was handed to the awaiting pediatricians. The placenta was removed intact and appeared normal with a 3-vessel cord. An Alexis retractor was placed. The uterus was cleared of all clot and debris. The hysterotomy was closed with running sutures of 0 Vicryl suture. A second imbricating layer was placed with the same suture. Excellent hemostasis was observed.   The Alexis retractor was removed. The pelvis was irrigated and again, excellent hemostasis was noted. The peritoneum was closed with a running stitch of 2-0 Vicryl. The On Q Pain pump System was then placed.  Trocars were placed through the abdominal wall into the subfascial space and these were used to thread the silver soaker cathaters into place.The rectus muscles were inspected and were hemostatic. The rectus fascia was then reapproximated with running sutures of 0-vicryl, with careful placement not to incorporate the cathaters. Subcutaneous tissues are then irrigated with saline and hemostasis assured with the bovie. The subcutaneous fat was approximated with 3-0 plain and a running stitch.  The skin was closed with 4-0 monocryl suture in a subcuticular fashion followed by skin adhesive. The cathaters are flushed each with 5 mL of Bupivicaine and stabilized into place with dressing. Instrument, sponge, and needle counts were correct prior to the abdominal closure and at the conclusion of the case.  The patient tolerated the procedure well and was transferred to the recovery room in stable condition.    Margaret fryer assisted with this case. This was a high level case requiring a Physicist, medical. No other assistant was readily available. She assisted with retraction of tissue and application of fundal pressure during the case.   Homero Fellers MD [facility] 02/27/21 9:23 AM

## 2021-02-28 LAB — CBC
HCT: 31.9 % — ABNORMAL LOW (ref 36.0–46.0)
Hemoglobin: 10.4 g/dL — ABNORMAL LOW (ref 12.0–15.0)
MCH: 28 pg (ref 26.0–34.0)
MCHC: 32.6 g/dL (ref 30.0–36.0)
MCV: 85.8 fL (ref 80.0–100.0)
Platelets: 144 10*3/uL — ABNORMAL LOW (ref 150–400)
RBC: 3.72 MIL/uL — ABNORMAL LOW (ref 3.87–5.11)
RDW: 14 % (ref 11.5–15.5)
WBC: 8.4 10*3/uL (ref 4.0–10.5)
nRBC: 0 % (ref 0.0–0.2)

## 2021-02-28 NOTE — Progress Notes (Signed)
Obstetric Postpartum/PostOperative Daily Progress Note Subjective:  40 y.o. [id number] post-operative day # 1 status post repeat cesarean delivery.  She is ambulating, is tolerating po, is voiding spontaneously.  Her pain is well controlled on PO pain medications and On Q pump. Her lochia is less than menses. She reports baby was cluster feeding overnight and has a good latch.   Medications SCHEDULED MEDICATIONS  . ferrous sulfate  325 mg Oral BID WC  . ibuprofen  800 mg Oral Q8H  . prenatal multivitamin  1 tablet Oral Q1200  . senna-docusate  2 tablet Oral Q24H  . simethicone  80 mg Oral TID PC    MEDICATION INFUSIONS  . bupivacaine ON-Q pain pump      PRN MEDICATIONS  bisacodyl, coconut oil, witch hazel-glycerin **AND** dibucaine, diphenhydrAMINE, menthol-cetylpyridinium, ondansetron (ZOFRAN) IV, oxyCODONE, simethicone, sodium phosphate, zolpidem    Objective:   Vitals:   02/28/21 0100 02/28/21 0320 02/28/21 0500 02/28/21 0755  BP:  105/64  108/68  Pulse:  82  84  Resp:  18  18  Temp:  97.7 F (36.5 C)  97.7 F (36.5 C)  TempSrc:    Oral  SpO2: 99% 98% 98% 98%  Weight:      Height:        Current Vital Signs 24h Vital Sign Ranges  T 97.7 F (36.5 C) Temp  Avg: 97.9 F (36.6 C)  Min: 97.7 F (36.5 C)  Max: 98.5 F (36.9 C)  BP 108/68 BP  Min: 97/70  Max: 120/72  HR 84 Pulse  Avg: 77  Min: 64  Max: 93  RR 18 Resp  Avg: 15.9  Min: 10  Max: 27  SaO2 98 % Room Air SpO2  Avg: 98.8 %  Min: 90 %  Max: 100 %       24 Hour I/O Current Shift I/O  Time Ins Outs 04/18 0701 - 04/19 0700 In: 1394.5 [I.V.:1194.5] Out: 2333 [Urine:1845] No intake/output data recorded.  General: NAD Pulmonary: no increased work of breathing Abdomen: non-distended, non-tender, fundus firm at level of umbilicus Inc: Clean/dry/intact, On Q intact Extremities: no edema, no erythema, no tenderness  Labs:  Recent Labs  Lab 02/24/21 1025 02/28/21 0706  WBC 7.1 8.4  HGB 12.2 10.4*  HCT 37.2 31.9*   PLT 200 144*     Assessment:   Toni y.o. [id number] postoperative day # 1 status post repeat cesarean section, lactating  Plan:  1) Acute blood loss anemia - hemodynamically stable and asymptomatic - po ferrous sulfate  2) O POS / Rubella 7.86 (10/11 1005)/ Varicella Immune  3) TDAP status: last administration 12/26/2018  4) breast /Contraception = non-hormonal  5) Disposition: continue current care   Rod Can, CNM 02/28/2021 9:51 AM

## 2021-02-28 NOTE — Lactation Note (Signed)
This note was copied from a baby's chart. Lactation Consultation Note  Patient Name: Toni Byrd RJJOA'C Date: 02/28/2021 Reason for consult: Follow-up assessment Age:38 hours        LC student completed follow up. MOI was resting and infant sleep in basinet. MOI reports feeding through the night was rough, infant fed every hour. At time of [place] visit, infant had not fed of the day. [facility] student provided education on feeding at least 8x/24hrs, what to expect at on day two with feedings, watch for feeding cues, output. During visit infant started to cue, MOI didn't seem interested in feeding said she wasn't crying. [employer] [facility] student educated on not waiting until infant cries, its a late feeding sign. MOI placed infant in cross cradle position, to feed on right breast. [place] student assisted with latch, position and support.  MOI expressed she would be starting pumping and pacifier use once she is discharged. [facility] student educated and encouraged latching infant more often, delay pumping until 4 weeks, and pacifier use. MOI is to increase skin to skin time, watch for feeding cues and feed on demand.   Maternal Data Has patient been taught Hand Expression?: Yes Does the patient have breastfeeding experience prior to this delivery?: Yes  Feeding Mother's Current Feeding Choice: Breast Milk  LATCH Score Latch: Repeated attempts needed to sustain latch, nipple held in mouth throughout feeding, stimulation needed to elicit sucking reflex.  Audible Swallowing: None  Type of Nipple: Everted at rest and after stimulation  Comfort (Breast/Nipple): Soft / non-tender  Hold (Positioning): Assistance needed to correctly position infant at breast and maintain latch.  LATCH Score: 6   Lactation Tools Discussed/Used    Interventions    Discharge Pump: Personal  Consult Status Consult Status: Follow-up Date: 03/01/21 Follow-up type: In-patient    Arlyss Queen 02/28/2021, 2:39 PM

## 2021-02-28 NOTE — Anesthesia Post-op Follow-up Note (Signed)
  Anesthesia Pain Follow-up Note  Patient: Toni Byrd  Day #: 1  Date of Follow-up: 02/28/2021 Time: 7:52 AM  Last Vitals:  Vitals:   02/28/21 0320 02/28/21 0500  BP: 105/64   Pulse: 82   Resp: 18   Temp: 36.5 C   SpO2: 98% 98%    Level of Consciousness: alert  Pain: none   Side Effects:None  Catheter Site Exam:clean, dry, no drainage     Plan: D/C from anesthesia care at surgeon's request  Caryl Asp

## 2021-02-28 NOTE — Anesthesia Postprocedure Evaluation (Signed)
Anesthesia Post Note  Patient: Toni Byrd  Procedure(s) Performed: CESAREAN SECTION (N/A )  Patient location during evaluation: Mother Baby Anesthesia Type: Spinal Level of consciousness: oriented and awake and alert Pain management: pain level controlled Vital Signs Assessment: post-procedure vital signs reviewed and stable Respiratory status: spontaneous breathing and respiratory function stable Cardiovascular status: blood pressure returned to baseline and stable Postop Assessment: no headache, no backache, no apparent nausea or vomiting and able to ambulate Anesthetic complications: no   No complications documented.   Last Vitals:  Vitals:   02/28/21 0320 02/28/21 0500  BP: 105/64   Pulse: 82   Resp: 18   Temp: 36.5 C   SpO2: 98% 98%    Last Pain:  Vitals:   02/28/21 0320  TempSrc:   PainSc: 0-No pain                 Caryl Asp

## 2021-03-01 MED ORDER — IBUPROFEN 800 MG PO TABS: 800.0000 mg | ORAL_TABLET | Freq: Three times a day (TID) | ORAL | 0 refills | Status: AC

## 2021-03-01 MED ORDER — OXYCODONE HCL 5 MG PO TABS: 5.0000 mg | ORAL_TABLET | Freq: Four times a day (QID) | ORAL | 0 refills | Status: AC | PRN

## 2021-03-01 NOTE — Progress Notes (Signed)
RN reviewed discharge instructions with patient. RN discussed follow up care, when to call MD/doctor's office and discussed bleeding, infection, postpartum blues and medications/prescriptions, along with other postpartum material provided at discharge. Patient verbalized understanding and discharged home in stable condition with family.

## 2021-03-01 NOTE — Lactation Note (Signed)
This note was copied from a baby's chart. Lactation Consultation Note  Patient Name: Toni Byrd ZYSAY'T Date: 03/01/2021 Reason for consult: Follow-up assessment Age:38 hours       [facility] student completed discharge consult. MOI and infant were skin to skin during feeding. [facility] student provided education relating to transitioning home. LC noticed pacifier in room, educated on when to use if needed. MOI infant plans to pump when getting home, encouraged delaying pumping session at least until 4 weeks.   MOI is doing well with latch and positioning infant on her own. MOI is to feed at least 8-12x/2hrs, continue with skin to skin, and to contact outpatient lactation services as needed.   Maternal Data Has patient been taught Hand Expression?: Yes  Feeding Mother's Current Feeding Choice: Breast Milk   Discharge Pump: Personal  Consult Status Consult Status: Complete    Toni Byrd 03/01/2021, 10:58 AM

## 2021-03-01 NOTE — Discharge Instructions (Signed)
Postpartum Care After Cesarean Delivery This sheet gives you information about how to care for yourself from the time you deliver your baby to up to 6-12 weeks after delivery (postpartum period). Your health care provider may also give you more specific instructions. If you have problems or questions, contact your health care provider. Follow these instructions at home: Medicines  Take over-the-counter and prescription medicines only as told by your health care provider.  If you were prescribed an antibiotic medicine, take it as told by your health care provider. Do not stop taking the antibiotic even if you start to feel better.  Ask your health care provider if the medicine prescribed to you: ? Requires you to avoid driving or using heavy machinery. ? Can cause constipation. You may need to take actions to prevent or treat constipation, such as:  Drink enough fluid to keep your urine pale yellow.  Take over-the-counter or prescription medicines.  Eat foods that are high in fiber, such as beans, whole grains, and fresh fruits and vegetables.  Limit foods that are high in fat and processed sugars, such as fried or sweet foods. Activity  Gradually return to your normal activities as told by your health care provider.  Avoid activities that take a lot of effort and energy (are strenuous) until approved by your health care provider. Walking at a slow to moderate pace is usually safe. Ask your health care provider what activities are safe for you. ? Do not lift anything that is heavier than your baby or 10 lb (4.5 kg) as told by your health care provider. ? Do not vacuum, climb stairs, or drive a car for as long as told by your health care provider.  If possible, have someone help you at home until you are able to do your usual activities yourself.  Rest as much as possible. Try to rest or take naps while your baby is sleeping. Vaginal bleeding  It is normal to have vaginal bleeding  (lochia) after delivery. Wear a sanitary pad to absorb vaginal bleeding and discharge. ? During the first week after delivery, the amount and appearance of lochia is often similar to a menstrual period. ? Over the next few weeks, it will gradually decrease to a dry, yellow-brown discharge. ? For most women, lochia stops completely by 4-6 weeks after delivery. Vaginal bleeding can vary from woman to woman.  Change your sanitary pads frequently. Watch for any changes in your flow, such as: ? A sudden increase in volume. ? A change in color. ? Large blood clots.  If you pass a blood clot, save it and call your health care provider to discuss. Do not flush blood clots down the toilet before you get instructions from your health care provider.  Do not use tampons or douches until your health care provider says this is safe.  If you are not breastfeeding, your period should return 6-8 weeks after delivery. If you are breastfeeding, your period may return anytime between 8 weeks after delivery and the time that you stop breastfeeding. Perineal care  If your C-section (Cesarean section) was unplanned, and you were allowed to labor and push before delivery, you may have pain, swelling, and discomfort of the tissue between your vaginal opening and your anus (perineum). You may also have an incision in the tissue (episiotomy) or the tissue may have torn during delivery. Follow these instructions as told by your health care provider: ? Keep your perineum clean and dry as told by  your health care provider. Use medicated pads and pain-relieving sprays and creams as directed. ? If you have an episiotomy or vaginal tear, check the area every day for signs of infection. Check for:  Redness, swelling, or pain.  Fluid or blood.  Warmth.  Pus or a bad smell. ? You may be given a squirt bottle to use instead of wiping to clean the perineum area after you go to the bathroom. As you start healing, you may use  the squirt bottle before wiping yourself. Make sure to wipe gently. ? To relieve pain caused by an episiotomy, vaginal tear, or hemorrhoids, try taking a warm sitz bath 2-3 times a day. A sitz bath is a warm water bath that is taken while you are sitting down. The water should only come up to your hips and should cover your buttocks.   Breast care  Within the first few days after delivery, your breasts may feel heavy, full, and uncomfortable (breast engorgement). You may also have milk leaking from your breasts. Your health care provider can suggest ways to help relieve breast discomfort. Breast engorgement should go away within a few days.  If you are breastfeeding: ? Wear a bra that supports your breasts and fits you well. ? Keep your nipples clean and dry. Apply creams and ointments as told by your health care provider. ? You may need to use breast pads to absorb milk leakage. ? You may have uterine contractions every time you breastfeed for several weeks after delivery. Uterine contractions help your uterus return to its normal size. ? If you have any problems with breastfeeding, work with your health care provider or a lactation consultant.  If you are not breastfeeding: ? Avoid touching your breasts as this can make your breasts produce more milk. ? Wear a well-fitting bra and use cold packs to help with swelling. ? Do not squeeze out (express) milk. This causes you to make more milk. Intimacy and sexuality  Ask your health care provider when you can engage in sexual activity. This may depend on your: ? Risk of infection. ? Healing rate. ? Comfort and desire to engage in sexual activity.  You are able to get pregnant after delivery, even if you have not had your period. If desired, talk with your health care provider about methods of family planning or birth control (contraception). Lifestyle  Do not use any products that contain nicotine or tobacco, such as cigarettes, e-cigarettes,  and chewing tobacco. If you need help quitting, ask your health care provider.  Do not drink alcohol, especially if you are breastfeeding. Eating and drinking  Drink enough fluid to keep your urine pale yellow.  Eat high-fiber foods every day. These may help prevent or relieve constipation. High-fiber foods include: ? Whole grain cereals and breads. ? Brown rice. ? Beans. ? Fresh fruits and vegetables.  Take your prenatal vitamins until your postpartum checkup or until your health care provider tells you it is okay to stop.   General instructions  Keep all follow-up visits for you and your baby as told by your health care provider. Most women visit their health care provider for a postpartum checkup within the first 3-6 weeks after delivery. Contact a health care provider if you:  Feel unable to cope with the changes that a new baby brings to your life, and these feelings do not go away.  Feel unusually sad or worried.  Have breasts that are painful, hard, or turn red.  Have   a fever.  Have trouble holding urine or keeping urine from leaking.  Have little or no interest in activities you used to enjoy.  Have not breastfed at all and you have not had a menstrual period for 12 weeks after delivery.  Have stopped breastfeeding and you have not had a menstrual period for 12 weeks after you stopped breastfeeding.  Have questions about caring for yourself or your baby.  Pass a blood clot from your vagina. Get help right away if you:  Have chest pain.  Have difficulty breathing.  Have sudden, severe leg pain.  Have severe pain or cramping in your abdomen.  Bleed from your vagina so much that you fill more than one sanitary pad in one hour. Bleeding should not be heavier than your heaviest period.  Develop a severe headache.  Faint.  Have blurred vision or spots in your vision.  Have a bad-smelling vaginal discharge.  Have thoughts about hurting yourself or your  baby. If you ever feel like you may hurt yourself or others, or have thoughts about taking your own life, get help right away. You can go to your nearest emergency department or call:  Your local emergency services (911 in the [place]  A suicide crisis helpline, such as the [facility] at 425-639-9440. This is open 24 hours a day. Summary  The period of time from when you deliver your baby to up to 6-12 weeks after delivery is called the postpartum period.  Gradually return to your normal activities as told by your health care provider.  Keep all follow-up visits for you and your baby as told by your health care provider. This information is not intended to replace advice given to you by your health care provider. Make sure you discuss any questions you have with your health care provider. Document Revised: 06/18/2018 Document Reviewed: 06/18/2018 Elsevier Patient Education  2021 [place]. Discharge Instructions:   Follow-up Appointment: 1 week, please call the office to schedule  If there are any new medications, they have been ordered and will be available for pickup at the listed pharmacy on your way home from the hospital.   Call the office if you have any of the following: headache, visual changes, fever >101.0 F, chills, shortness of breath, breast concerns, excessive vaginal bleeding, incision drainage or problems, leg pain or redness, depression or any other concerns. If you have vaginal discharge with an odor, let your doctor know.   It is normal to bleed for up to 6 weeks. You should not soak through more than 1 pad in 1 hour. If you have a blood clot larger than your fist with continued bleeding, call your doctor.   After a c-section, you should expect a small amount of blood or clear fluid coming from the incision and abdominal cramping/soreness. Inspect your incision site daily. Stand in front of a mirror to look for any redness, incision opening,  or discolored/odorness drainage. Take a shower daily and continue good hygiene. Use own towel and washcloth (do not share). Make sure your sheets on your bed are clean. No pets sleeping around your incision site. Dressing will be removed at your postpartum visit. If the dressing does become wet or soiled underneath, it is okay to remove it before your visit.    On-Q pump: You will remove on day 4 after insertion or if the ball becomes flat before day 4. You will remove on: 03/03/2021  Activity: Do not lift > 15 lbs  for 6 weeks (do not lift anything heavier than your baby). No intercourse, tampons, swimming pools, hot tubs, baths (only showers) for 6 weeks.  No driving for 1-2 weeks. Do not drive while taking narcotic or opioid pain medication.  Continue taking your prenatal vitamin, especially if breastfeeding. Increase calories and fluids (water) while breastfeeding.   Your milk will come in, in the next couple of days (right now it is colostrum). You may have a slight fever when your milk comes in, but it should go away on its own.  If it does not, and rises above 101 F please call the doctor. You will also feel achy and your breasts will be firm. They will also start to leak. If you are breastfeeding, continue as you have been and you can pump/express milk for comfort.   If you have too much milk, your breasts can become engorged, which could lead to mastitis. This is an infection of the milk ducts. It can be very painful and you will need to notify your doctor to obtain a prescription for antibiotics. You can also treat it with a shower or hot/cold compress.   For concerns about your baby, please call your pediatrician.  For breastfeeding concerns, the lactation consultant can be reached at [phone].   Postpartum blues (feelings of happy one minute and sad another minute) are normal for the first few weeks but if it gets worse let your doctor know.   Congratulations! We enjoyed caring for  you and your new bundle of joy!      Postpartum Care After Vaginal Delivery The following information offers guidance about how to care for yourself from the time you deliver your baby to 6-12 weeks after delivery (postpartum period). If you have problems or questions, contact your health care provider for more specific instructions. Follow these instructions at home: Vaginal bleeding  It is normal to have vaginal bleeding (lochia) after delivery. Wear a sanitary pad for bleeding and discharge. ? During the first week after delivery, the amount and appearance of lochia is often similar to a menstrual period. ? Over the next few weeks, it will gradually decrease to a dry, yellow-brown discharge. ? For most women, lochia stops completely by 4-6 weeks after delivery, but can vary.  Change your sanitary pads frequently. Watch for any changes in your flow, such as: ? A sudden increase in volume. ? A change in color. ? Large blood clots.  If you pass a blood clot from your vagina, save it and call your health care provider. Do not flush blood clots down the toilet before talking with your health care provider.  Do not use tampons or douches until your health care provider approves.  If you are not breastfeeding, your period should return 6-8 weeks after delivery. If you are feeding your baby breast milk only, your period may not return until you stop breastfeeding. Perineal care  Keep the area between the vagina and the anus (perineum) clean and dry. Use medicated pads and pain-relieving sprays and creams as directed.  If you had a surgical cut in the perineum (episiotomy) or a tear, check the area for signs of infection until you are healed. Check for: ? More redness, swelling, or pain. ? Fluid or blood coming from the cut or tear. ? Warmth. ? Pus or a bad smell.  You may be given a squirt bottle to use instead of wiping to clean the perineum area after you use the bathroom. Pat the  area gently to dry it.  To relieve pain caused by an episiotomy, a tear, or swollen veins in the anus (hemorrhoids), take a warm sitz bath 2-3 times a day. In a sitz bath, the warm water should only come up to your hips and cover your buttocks.   Breast care  In the first few days after delivery, your breasts may feel heavy, full, and uncomfortable (breast engorgement). Milk may also leak from your breasts. Ask your health care provider about ways to help relieve the discomfort.  If you are breastfeeding: ? Wear a bra that supports your breasts and fits well. Use breast pads to absorb milk that leaks. ? Keep your nipples clean and dry. Apply creams and ointments as told. ? You may have uterine contractions every time you breastfeed for up to several weeks after delivery. This helps your uterus return to its normal size. ? If you have any problems with breastfeeding, notify your health care provider or lactation consultant.  If you are not breastfeeding: ? Avoid touching your breasts. Do not squeeze out (express) milk. Doing this can make your breasts produce more milk. ? Wear a good-fitting bra and use cold packs to help with swelling. Intimacy and sexuality  Ask your health care provider when you can engage in sexual activity. This may depend upon: ? Your risk of infection. ? How fast you are healing. ? Your comfort and desire to engage in sexual activity.  You are able to get pregnant after delivery, even if you have not had your period. Talk with your health care provider about methods of birth control (contraception) or family planning if you desire future pregnancies. Medicines  Take over-the-counter and prescription medicines only as told by your health care provider.  Take an over-the-counter stool softener to help ease bowel movements as told by your health care provider.  If you were prescribed an antibiotic medicine, take it as told by your health care provider. Do not stop  taking the antibiotic even if you start to feel better.  Review all previous and current prescriptions to check for possible transfer into breast milk. Activity  Gradually return to your normal activities as told by your health care provider.  Rest as much as possible. Nap while your baby is sleeping. Eating and drinking  Drink enough fluid to keep your urine pale yellow.  To help prevent or relieve constipation, eat high-fiber foods every day.  Choose healthy eating to support breastfeeding or weight loss goals.  Take your prenatal vitamins until your health care provider tells you to stop.   General tips/recommendations  Do not use any products that contain nicotine or tobacco. These products include cigarettes, chewing tobacco, and vaping devices, such as e-cigarettes. If you need help quitting, ask your health care provider.  Do not drink alcohol, especially if you are breastfeeding.  Do not take medications or drugs that are not prescribed to you, especially if you are breastfeeding.  Visit your health care provider for a postpartum checkup within the first 3-6 weeks after delivery.  Complete a comprehensive postpartum visit no later than 12 weeks after delivery.  Keep all follow-up visits for you and your baby. Contact a health care provider if:  You feel unusually sad or worried.  Your breasts become red, painful, or hard.  You have a fever or other signs of an infection.  You have bleeding that is soaking through one pad an hour or you have blood clots.  You have a  severe headache that doesn't go away or you have vision changes.  You have nausea and vomiting and are unable to eat or drink anything for 24 hours. Get help right away if:  You have chest pain or difficulty breathing.  You have sudden, severe leg pain.  You faint or have a seizure.  You have thoughts about hurting yourself or your baby. If you ever feel like you may hurt yourself or others, or  have thoughts about taking your own life, get help right away. Go to your nearest emergency department or:  Call your local emergency services (911 in the [place]  The National Suicide Prevention Lifeline at (614)491-9517. This suicide crisis helpline is open 24 hours a day.  Text the Crisis Text Line at 714 820 6946 (in the [place].). Summary  The period of time after you deliver your newborn up to 6-12 weeks after delivery is called the postpartum period.  Keep all follow-up visits for you and your baby.  Review all previous and current prescriptions to check for possible transfer into breast milk.  Contact a health care provider if you feel unusually sad or worried during the postpartum period. This information is not intended to replace advice given to you by your health care provider. Make sure you discuss any questions you have with your health care provider. Document Revised: 07/14/2020 Document Reviewed: 07/14/2020 Elsevier Patient Education  [place].   Breastfeeding  Choosing to breastfeed is one of the best decisions you can make for yourself and your baby. A change in hormones during pregnancy causes your breasts to make breast milk in your milk-producing glands. Hormones prevent breast milk from being released before your baby is born. They also prompt milk flow after birth. Once breastfeeding has begun, thoughts of your baby, as well as his or her sucking or crying, can stimulate the release of milk from your milk-producing glands. Benefits of breastfeeding Research shows that breastfeeding offers many health benefits for infants and mothers. It also offers a cost-free and convenient way to feed your baby. For your baby  Your first milk (colostrum) helps your baby's digestive system to function better.  Special cells in your milk (antibodies) help your baby to fight off infections.  Breastfed babies are less likely to develop asthma, allergies, obesity, or type 2  diabetes. They are also at lower risk for sudden infant death syndrome (SIDS).  Nutrients in breast milk are better able to meet your baby's needs compared to infant formula.  Breast milk improves your baby's brain development. For you  Breastfeeding helps to create a very special bond between you and your baby.  Breastfeeding is convenient. Breast milk costs nothing and is always available at the correct temperature.  Breastfeeding helps to burn calories. It helps you to lose the weight that you gained during pregnancy.  Breastfeeding makes your uterus return faster to its size before pregnancy. It also slows bleeding (lochia) after you give birth.  Breastfeeding helps to lower your risk of developing type 2 diabetes, osteoporosis, rheumatoid arthritis, cardiovascular disease, and breast, ovarian, uterine, and endometrial cancer later in life. Breastfeeding basics Starting breastfeeding  Find a comfortable place to sit or lie down, with your neck and back well-supported.  Place a pillow or a rolled-up blanket under your baby to bring him or her to the level of your breast (if you are seated). Nursing pillows are specially designed to help support your arms and your baby while you breastfeed.  Make sure that your  baby's tummy (abdomen) is facing your abdomen.  Gently massage your breast. With your fingertips, massage from the outer edges of your breast inward toward the nipple. This encourages milk flow. If your milk flows slowly, you may need to continue this action during the feeding.  Support your breast with 4 fingers underneath and your thumb above your nipple (make the letter "C" with your hand). Make sure your fingers are well away from your nipple and your baby's mouth.  Stroke your baby's lips gently with your finger or nipple.  When your baby's mouth is open wide enough, quickly bring your baby to your breast, placing your entire nipple and as much of the areola as possible  into your baby's mouth. The areola is the colored area around your nipple. ? More areola should be visible above your baby's upper lip than below the lower lip. ? Your baby's lips should be opened and extended outward (flanged) to ensure an adequate, comfortable latch. ? Your baby's tongue should be between his or her lower gum and your breast.  Make sure that your baby's mouth is correctly positioned around your nipple (latched). Your baby's lips should create a seal on your breast and be turned out (everted).  It is common for your baby to suck about 2-3 minutes in order to start the flow of breast milk. Latching Teaching your baby how to latch onto your breast properly is very important. An improper latch can cause nipple pain, decreased milk supply, and poor weight gain in your baby. Also, if your baby is not latched onto your nipple properly, he or she may swallow some air during feeding. This can make your baby fussy. Burping your baby when you switch breasts during the feeding can help to get rid of the air. However, teaching your baby to latch on properly is still the best way to prevent fussiness from swallowing air while breastfeeding. Signs that your baby has successfully latched onto your nipple  Silent tugging or silent sucking, without causing you pain. Infant's lips should be extended outward (flanged).  Swallowing heard between every 3-4 sucks once your milk has started to flow (after your let-down milk reflex occurs).  Muscle movement above and in front of his or her ears while sucking. Signs that your baby has not successfully latched onto your nipple  Sucking sounds or smacking sounds from your baby while breastfeeding.  Nipple pain. If you think your baby has not latched on correctly, slip your finger into the corner of your baby's mouth to break the suction and place it between your baby's gums. Attempt to start breastfeeding again. Signs of successful breastfeeding Signs  from your baby  Your baby will gradually decrease the number of sucks or will completely stop sucking.  Your baby will fall asleep.  Your baby's body will relax.  Your baby will retain a small amount of milk in his or her mouth.  Your baby will let go of your breast by himself or herself. Signs from you  Breasts that have increased in firmness, weight, and size 1-3 hours after feeding.  Breasts that are softer immediately after breastfeeding.  Increased milk volume, as well as a change in milk consistency and color by the fifth day of breastfeeding.  Nipples that are not sore, cracked, or bleeding. Signs that your baby is getting enough milk  Wetting at least 1-2 diapers during the first 24 hours after birth.  Wetting at least 5-6 diapers every 24 hours for the  first week after birth. The urine should be clear or pale yellow by the age of 5 days.  Wetting 6-8 diapers every 24 hours as your baby continues to grow and develop.  At least 3 stools in a 24-hour period by the age of 5 days. The stool should be soft and yellow.  At least 3 stools in a 24-hour period by the age of 7 days. The stool should be seedy and yellow.  No loss of weight greater than 10% of birth weight during the first 3 days of life.  Average weight gain of 4-7 oz (113-198 g) per week after the age of 4 days.  Consistent daily weight gain by the age of 5 days, without weight loss after the age of 2 weeks. After a feeding, your baby may spit up a small amount of milk. This is normal. Breastfeeding frequency and duration Frequent feeding will help you make more milk and can prevent sore nipples and extremely full breasts (breast engorgement). Breastfeed when you feel the need to reduce the fullness of your breasts or when your baby shows signs of hunger. This is called "breastfeeding on demand." Signs that your baby is hungry include:  Increased alertness, activity, or restlessness.  Movement of the head  from side to side.  Opening of the mouth when the corner of the mouth or cheek is stroked (rooting).  Increased sucking sounds, smacking lips, cooing, sighing, or squeaking.  Hand-to-mouth movements and sucking on fingers or hands.  Fussing or crying. Avoid introducing a pacifier to your baby in the first 4-6 weeks after your baby is born. After this time, you may choose to use a pacifier. Research has shown that pacifier use during the first year of a baby's life decreases the risk of sudden infant death syndrome (SIDS). Allow your baby to feed on each breast as long as he or she wants. When your baby unlatches or falls asleep while feeding from the first breast, offer the second breast. Because newborns are often sleepy in the first few weeks of life, you may need to awaken your baby to get him or her to feed. Breastfeeding times will vary from baby to baby. However, the following rules can serve as a guide to help you make sure that your baby is properly fed:  Newborns (babies 15 weeks of age or younger) may breastfeed every 1-3 hours.  Newborns should not go without breastfeeding for longer than 3 hours during the day or 5 hours during the night.  You should breastfeed your baby a minimum of 8 times in a 24-hour period. Breast milk pumping Pumping and storing breast milk allows you to make sure that your baby is exclusively fed your breast milk, even at times when you are unable to breastfeed. This is especially important if you go back to work while you are still breastfeeding, or if you are not able to be present during feedings. Your lactation consultant can help you find a method of pumping that works best for you and give you guidelines about how long it is safe to store breast milk.      Caring for your breasts while you breastfeed Nipples can become dry, cracked, and sore while breastfeeding. The following recommendations can help keep your breasts moisturized and healthy:  Avoid  using soap on your nipples.  Wear a supportive bra designed especially for nursing. Avoid wearing underwire-style bras or extremely tight bras (sports bras).  Air-dry your nipples for 3-4 minutes after  each feeding.  Use only cotton bra pads to absorb leaked breast milk. Leaking of breast milk between feedings is normal.  Use lanolin on your nipples after breastfeeding. Lanolin helps to maintain your skin's normal moisture barrier. Pure lanolin is not harmful (not toxic) to your baby. You may also hand express a few drops of breast milk and gently massage that milk into your nipples and allow the milk to air-dry. In the first few weeks after giving birth, some women experience breast engorgement. Engorgement can make your breasts feel heavy, warm, and tender to the touch. Engorgement peaks within 3-5 days after you give birth. The following recommendations can help to ease engorgement:  Completely empty your breasts while breastfeeding or pumping. You may want to start by applying warm, moist heat (in the shower or with warm, water-soaked hand towels) just before feeding or pumping. This increases circulation and helps the milk flow. If your baby does not completely empty your breasts while breastfeeding, pump any extra milk after he or she is finished.  Apply ice packs to your breasts immediately after breastfeeding or pumping, unless this is too uncomfortable for you. To do this: ? Put ice in a plastic bag. ? Place a towel between your skin and the bag. ? Leave the ice on for 20 minutes, 2-3 times a day.  Make sure that your baby is latched on and positioned properly while breastfeeding. If engorgement persists after 48 hours of following these recommendations, contact your health care provider or a Science writer. Overall health care recommendations while breastfeeding  Eat 3 healthy meals and 3 snacks every day. Well-nourished mothers who are breastfeeding need an additional 450-500  calories a day. You can meet this requirement by increasing the amount of a balanced diet that you eat.  Drink enough water to keep your urine pale yellow or clear.  Rest often, relax, and continue to take your prenatal vitamins to prevent fatigue, stress, and low vitamin and mineral levels in your body (nutrient deficiencies).  Do not use any products that contain nicotine or tobacco, such as cigarettes and e-cigarettes. Your baby may be harmed by chemicals from cigarettes that pass into breast milk and exposure to secondhand smoke. If you need help quitting, ask your health care provider.  Avoid alcohol.  Do not use illegal drugs or marijuana.  Talk with your health care provider before taking any medicines. These include over-the-counter and prescription medicines as well as vitamins and herbal supplements. Some medicines that may be harmful to your baby can pass through breast milk.  It is possible to become pregnant while breastfeeding. If birth control is desired, ask your health care provider about options that will be safe while breastfeeding your baby. Where to find more information: [employer]: www.llli.org Contact a health care provider if:  You feel like you want to stop breastfeeding or have become frustrated with breastfeeding.  Your nipples are cracked or bleeding.  Your breasts are red, tender, or warm.  You have: ? Painful breasts or nipples. ? A swollen area on either breast. ? A fever or chills. ? Nausea or vomiting. ? Drainage other than breast milk from your nipples.  Your breasts do not become full before feedings by the fifth day after you give birth.  You feel sad and depressed.  Your baby is: ? Too sleepy to eat well. ? Having trouble sleeping. ? More than 45 week old and wetting fewer than 6 diapers in a 24-hour period. ?  Not gaining weight by 76 days of age.  Your baby has fewer than 3 stools in a 24-hour period.  Your baby's  skin or the white parts of his or her eyes become yellow. Get help right away if:  Your baby is overly tired (lethargic) and does not want to wake up and feed.  Your baby develops an unexplained fever. Summary  Breastfeeding offers many health benefits for infant and mothers.  Try to breastfeed your infant when he or she shows early signs of hunger.  Gently tickle or stroke your baby's lips with your finger or nipple to allow the baby to open his or her mouth. Bring the baby to your breast. Make sure that much of the areola is in your baby's mouth. Offer one side and burp the baby before you offer the other side.  Talk with your health care provider or lactation consultant if you have questions or you face problems as you breastfeed. This information is not intended to replace advice given to you by your health care provider. Make sure you discuss any questions you have with your health care provider. Document Revised: 01/23/2018 Document Reviewed: 11/30/2016 Elsevier Patient Education  2021 [place].   Postpartum Baby Blues The postpartum period begins right after the birth of a baby. During this time, there is often joy and excitement. It is also a time of many changes in the life of the parents. A mother may feel happy one minute and sad or stressed the next. These feelings of sadness, called the baby blues, usually happen in the period right after the baby is born and go away within a week or two. What are the causes? The exact cause of this condition is not known. Changes in hormone levels after childbirth are believed to trigger some of the symptoms. Other factors that can play a role in these mood changes include:  Lack of sleep.  Stressful life events, such as financial problems, caring for a loved one, or death of a loved one.  Genetics. What are the signs or symptoms? Symptoms of this condition include:  Changes in mood, such as going from extreme happiness to  sadness.  A decrease in concentration.  Difficulty sleeping.  Crying spells and tearfulness.  Loss of appetite.  Irritability.  Anxiety. If these symptoms last for more than 2 weeks or become more severe, you may have postpartum depression. How is this diagnosed? This condition is diagnosed based on an evaluation of your symptoms. Your health care provider may use a screening tool that includes a list of questions to help identify a person with the baby blues or postpartum depression. How is this treated? The baby blues usually go away on their own in 1-2 weeks. Social support is often what is needed. You will be encouraged to get adequate sleep and rest. Follow these instructions at home: Lifestyle  Get as much rest as you can. Take a nap when the baby sleeps.  Exercise regularly as told by your health care provider. Some women find yoga and walking to be helpful.  Eat a balanced and nourishing diet. This includes plenty of fruits and vegetables, whole grains, and lean proteins.  Do little things that you enjoy. Take a bubble bath, read your favorite magazine, or listen to your favorite music.  Avoid alcohol.  Ask for help with household chores, cooking, grocery shopping, or running errands. Do not try to do everything yourself. Consider hiring a postpartum doula to help. This is  a professional who specializes in providing support to new mothers.  Try not to make any major life changes during pregnancy or right after giving birth. This can add stress.      General instructions  Talk to people close to you about how you are feeling. Get support from your partner, family members, friends, or other new moms. You may want to join a support group.  Find ways to manage stress. This may include: ? Writing your thoughts and feelings in a journal. ? Spending time outside. ? Spending time with people who make you laugh.  Try to stay positive in how you think. Think about the  things you are grateful for.  Take over-the-counter and prescription medicines only as told by your health care provider.  Let your health care provider know if you have any concerns.  Keep all postpartum visits. This is important. Contact a health care provider if:  Your baby blues do not go away after 2 weeks. Get help right away if:  You have thoughts of taking your own life (suicidal thoughts), or of harming your baby or someone else.  You see or hear things that are not there (hallucinations). If you ever feel like you may hurt yourself or others, or have thoughts about taking your own life, get help right away. Go to your nearest emergency department or:  Call your local emergency services (911 in the [place]  Call a suicide crisis helpline, such as the [facility], at (307) 874-2646. This is open 24 hours a day in the [place]  Text the Crisis Text Line at (406)475-3310 (in the [place] Summary  After giving birth, you may feel happy one minute and sad or stressed the next. Feelings of sadness that happen right after the baby is born and go away after a week or two are called the baby blues.  You can manage the baby blues by getting enough rest, eating a healthy diet, exercising, spending time with supportive people, and finding ways to manage stress.  If feelings of sadness and stress last longer than 2 weeks or get in the way of caring for your baby, talk with your health care provider. This may mean you have postpartum depression. This information is not intended to replace advice given to you by your health care provider. Make sure you discuss any questions you have with your health care provider. Document Revised: 04/22/2020 Document Reviewed: 04/22/2020 Elsevier Patient Education  2021 [employer].

## 2021-03-06 ENCOUNTER — Other Ambulatory Visit: Payer: Self-pay

## 2021-03-06 ENCOUNTER — Encounter: Payer: Self-pay | Admitting: Obstetrics and Gynecology

## 2021-03-06 ENCOUNTER — Ambulatory Visit (INDEPENDENT_AMBULATORY_CARE_PROVIDER_SITE_OTHER): Payer: 59 | Admitting: Obstetrics and Gynecology

## 2021-03-06 VITALS — BP 120/70 | Ht 67.0 in | Wt 205.4 lb

## 2021-03-06 DIAGNOSIS — Z98891 History of uterine scar from previous surgery: Secondary | ICD-10-CM

## 2021-03-06 NOTE — Progress Notes (Signed)
Postpartum Visit  Chief Complaint:  Chief Complaint  Patient presents with  . Postpartum Care    History of Present Illness: Patient is a 40 y.o. [id number] presents for postpartum visit.  Date of delivery: 02/27/2021 Type of delivery: Repeat cesarean delivery Laceration: none  Pregnancy or labor problems: none  Breast Feeding:  yes Lochia: spotting Post partum depression/anxiety noted:  no Edinburgh Post-Partum Depression Score:  0  Date of last PAP: 10/19/2021  normal - NIL HPV negative Any problems since the delivery:  Reporting increased tenderness on the right side. Taking opiod pain medication improves and controls the pain.   Newborn Details:  SINGLETON :  1. Baby Gender:female.  Infant Status: Infant doing well at home with mother.   Review of Systems: ROS   Past Medical History:  Past Medical History:  Diagnosis Date  . Anemia   . Bacterial vaginitis   . Dysmenorrhea   . Fibroid   . Hydrosalpinx   . Spontaneous abortion   . Trichomoniasis     Past Surgical History:  Past Surgical History:  Procedure Laterality Date  . CESAREAN SECTION N/A 09/09/2019   Procedure: CESAREAN SECTION;  Surgeon: Homero Fellers, MD;  Location: [facility] ORS;  Service: Obstetrics;  Laterality: N/A;  . CESAREAN SECTION N/A 02/27/2021   Procedure: CESAREAN SECTION;  Surgeon: Homero Fellers, MD;  Location: [facility] ORS;  Service: Obstetrics;  Laterality: N/A;  . DILATATION & CURETTAGE/HYSTEROSCOPY WITH MYOSURE N/A 01/23/2018   Procedure: DILATATION & CURETTAGE/HYSTEROSCOPY WITH MYOSURE,REMOVAL OF UTERINE LESION;  Surgeon: Will Bonnet, MD;  Location: [facility] ORS;  Service: Gynecology;  Laterality: N/A;  . LASIK      Family History:  Family History  Problem Relation Age of Onset  . Rheum arthritis Mother   . Hypothyroidism Mother   . Hypertension Mother   . Thyroid disease Mother   . Diabetes Father   . Prostate cancer Father 101  . Breast cancer Maternal Grandmother 80     Social History:  Social History   Socioeconomic History  . Marital status: Married    Spouse name: Jaquelyn Bitter  . Number of children: Not on file  . Years of education: Not on file  . Highest education level: Not on file  Occupational History  . Not on file  Tobacco Use  . Smoking status: Never Smoker  . Smokeless tobacco: Never Used  Vaping Use  . Vaping Use: Never used  Substance and Sexual Activity  . Alcohol use: Not Currently    Comment: occ  . Drug use: No  . Sexual activity: Not Currently    Partners: Male    Birth control/protection: None  Other Topics Concern  . Not on file  Social History Narrative  . Not on file   Social Determinants of Health   Financial Resource Strain: Not on file  Food Insecurity: Not on file  Transportation Needs: Not on file  Physical Activity: Not on file  Stress: Not on file  Social Connections: Not on file  Intimate Partner Violence: Not on file    Allergies:  Allergies  Allergen Reactions  . Other Rash    ADHESIVE FROM BANDAIDS-"BURNS SKIN"    Medications: Prior to Admission medications   Medication Sig Start Date End Date Taking? Authorizing Provider  ibuprofen (ADVIL) 800 MG tablet Take 1 tablet (800 mg total) by mouth every 8 (eight) hours. 03/01/21  Yes Rod Can, CNM  oxyCODONE (OXY IR/ROXICODONE) 5 MG immediate release tablet Take 1 tablet (5  mg total) by mouth every 6 (six) hours as needed for up to 5 days for moderate pain or severe pain. 03/01/21 03/06/21 Yes Rod Can, CNM  Prenatal Vit-Fe Fumarate-FA (MULTIVITAMIN-PRENATAL) 27-0.8 MG TABS tablet Take 1 tablet by mouth daily.   Yes [provider]    Physical Exam Vitals:  Vitals:   03/06/21 1032  BP: 120/70    Physical Exam Constitutional:      Appearance: Normal appearance. She is well-developed.  HENT:     Head: Normocephalic and atraumatic.  Eyes:     Extraocular Movements: Extraocular movements intact.     Pupils: Pupils are  equal, round, and reactive to light.  Neck:     Thyroid: No thyromegaly.  Cardiovascular:     Rate and Rhythm: Normal rate and regular rhythm.     Heart sounds: Normal heart sounds.  Pulmonary:     Effort: Pulmonary effort is normal.     Breath sounds: Normal breath sounds.  Abdominal:     General: Bowel sounds are normal. There is no distension.     Palpations: Abdomen is soft. There is no mass.     Comments: Incision is clean dry and intact Tender more on the right. No evidence of hematoma or infection  Musculoskeletal:     Cervical back: Neck supple.  Neurological:     Mental Status: She is alert and oriented to person, place, and time.  Skin:    General: Skin is warm and dry.  Psychiatric:        Behavior: Behavior normal.        Thought Content: Thought content normal.        Judgment: Judgment normal.  Vitals reviewed.     Assessment: 40 y.o. [id number] presenting for 6 week postpartum visit  Plan: Problem List Items Addressed This Visit      Other   Postpartum care following cesarean delivery    Other Visit Diagnoses    S/P cesarean section    -  Primary      1) Contraception-  Condoms planned  2)  Pap: ASCCP guidelines and rational discussed.  Patient opts for 5 year screening interval.  3) Patient underwent screening for postpartum depression with no concerns noted.  4) Incisional pain- discussed belly binder, pillow for sitting up. Continue optiod as needed. Does not need a refill at this time. IF not improving with next visit could do trigger point injection.    - Follow up 4-5 weeks for postpartum visit  Adrian Prows MD, [place], [facility] 03/06/2021 10:49 AM

## 2021-03-08 ENCOUNTER — Telehealth: Payer: Self-pay

## 2021-03-08 NOTE — Telephone Encounter (Addendum)
FMLA/DISABILITY form for [employer] filled out, signature obtained and given to [employer] for processing.

## 2021-04-13 ENCOUNTER — Other Ambulatory Visit: Payer: Self-pay

## 2021-04-13 ENCOUNTER — Encounter: Payer: Self-pay | Admitting: Obstetrics and Gynecology

## 2021-04-13 ENCOUNTER — Ambulatory Visit (INDEPENDENT_AMBULATORY_CARE_PROVIDER_SITE_OTHER): Payer: 59 | Admitting: Obstetrics and Gynecology

## 2021-04-13 VITALS — BP 120/72 | Ht 67.0 in | Wt 193.6 lb

## 2021-04-13 DIAGNOSIS — O924 Hypogalactia: Secondary | ICD-10-CM

## 2021-04-13 MED ORDER — METOCLOPRAMIDE HCL 10 MG PO TABS: 10.0000 mg | ORAL_TABLET | Freq: Three times a day (TID) | ORAL | 2 refills | Status: AC

## 2021-04-13 NOTE — Patient Instructions (Signed)
INCREASING YOUR MILK SUPPLY  Mothers make more milk by removing milk from their breasts often. Removing milk frequently:  __ If you are breastfeeding your baby directly, breastfeed your baby at least every three hours during the day. __ Use your breast pump for 5 to 10 minutes after each feeding or pump approximately 30 to 60 minutes after you finish feeding (Choose the schedule that is more convenient for you and so you remove more milk with the pump.) __ If you are using a breast pump instead of breastfeeding directly, pump your breasts at least 8 times each 24 hours (every three hours).  A schedule may be helpful as you plan at least 8 feedings and pumping. 7:00 a.m. 10:00 a.m. 1:00 p.m. 4:00 p.m. 7:00 p.m. 10:00 p.m. 1:00 a.m.* 4:00 a.m.* 25% of your milk can be obtained during the night.  *It is probably better to not set an alarm clock for nighttime feedings/pumping. If you or your baby wakes during the night, it is a good idea to feed or pump. You can feed or pump more often during the day (any time your baby is interested in feeding) in order to have 8 feedings or pumping each 24 hours.  Removing as much milk as possible:  __ Massage your breasts before feeding or pumping to move your milk to your nipple. Roll your nipples between your finger and thumb before feeding or pumping. __ Massage your breasts while you are feeding or pumping to help your milk flow. __ Always encourage your baby to take as much milk as possible at each feeding. Don't stop your feedings just because a certain amount of time has passed.  Taking care of yourself:  __ Increase your rest for 3 to 5 days. Get other people to help with household chores. Sleep or rest when your baby sleeps. __ Eating nutritious foods helps you feel better. There are no foods that magically increase your milk supply. __ Drink plenty of fluids when you are thirsty. Don't "force" yourself to drink lots of water and fluids.  (Don't drink more than you want.)  Reglan (Metaclopromide):  Reglan is a prescription medication which has been given to women to increase their breast milk supply. It increases the prolactin hormone which increases milk production. Reglan is often given to treat gastric reflux, a stomach condition. It is given to premature or fullterm babies who have this condition and is considered safe for these babies. Sometimes Reglan is given for months.  Reglan is a medication which has few side effects. Some people find that it makes them sleepy. This can be helpful for new mothers who need to take naps since they are up at night with their babies. However, if you are very drowsy and sleepy when you drive a car, you need to have someone else drive, decrease your dosage of Reglan, or stop taking Reglan. If your milk supply does not increase with this dosage (after 1 to 2 weeks), ask your doctor if you can take 15mg  of Reglan three times each day.  After two weeks of taking Reglan, if your milk supply has increased enough to satisfy your baby, you can gradually reduce the amount that you take. If taking less Reglan causes you to make less milk, return to the dosage that you were taking. Some women need to maintain a low dose of Reglan for a long time. Other mothers find that they can stop taking raglan and keep making enough milk.  Reglan helps about  80% of mothers make more milk. It is very important that you empty your breasts as completely as possible at least eight times each 24 hours.  Fenugreek (Trigonella foenum-graecum):  Fenugreek is an herb which is mostly used in the [place] to flavor artificial maple syrup. It has also been used to increase mothers' milk supply.  Fenugreek is not recommended if you are pregnant, if you have asthma, or if you have diabetes. Fenugreek is sold in health food stores in tea and capsule forms.  Mothers who take too much Fenugreek sometimes notice  that their skin and urine smell like maple syrup. Some mothers have noticed loose bowel movements when they take a large amount of Fenugreek. Many mothers take Fenugreek for several months.  The usual dosage of Reglan that is given to mothers to increase their milk supply is: 10-15mg  by mouth three times a day for two weeks. The usual dosage of Fenugreek that is recommended for mothers to increase their milk supply is: 3 capsules three times each day 3-4 cups of tea per day It usually takes 3 to 5 days to notice much difference in your milk supply.

## 2021-04-13 NOTE — Progress Notes (Signed)
Postpartum Visit  Chief Complaint:  Chief Complaint  Patient presents with  . Postpartum Care    History of Present Illness: Patient is a 40 y.o. Toni Byrd presents for postpartum visit.  Date of delivery: 02/27/2021 Type of delivery: repeat cesarean section Laceration: none  Pregnancy or labor problems: no  Breast Feeding:  yes Lochia: none Post partum depression/anxiety noted:  no Edinburgh Post-Partum Depression Score:  2  Date of last PAP: 10/19/2021  NIL HPV negative  Any problems since the delivery:  no  Newborn Details:  SINGLETON :  1. Baby Gender:female.  Infant Status: Infant doing well at home with mother.   Review of Systems: ROS   Past Medical History:  Past Medical History:  Diagnosis Date  . Anemia   . Bacterial vaginitis   . Dysmenorrhea   . Fibroid   . Hydrosalpinx   . Spontaneous abortion   . Trichomoniasis     Past Surgical History:  Past Surgical History:  Procedure Laterality Date  . CESAREAN SECTION N/A 09/09/2019   Procedure: CESAREAN SECTION;  Surgeon: Homero Fellers, MD;  Location: [facility] ORS;  Service: Obstetrics;  Laterality: N/A;  . CESAREAN SECTION N/A 02/27/2021   Procedure: CESAREAN SECTION;  Surgeon: Homero Fellers, MD;  Location: [facility] OR[facility];  Service: Obstetrics;  Laterality: N/A;  . DILATATION & CURETTAGE/HYSTEROSCOPY WITH MYOSURE N/A 01/23/2018   Procedure: DILATATION & CURETTAGE/HYSTEROSCOPY WITH MYOSURE,REMOVAL OF UTERINE LESION;  Surgeon: Will Bonnet, MD;  Location: [facility];  Service: Gynecology;  Laterality: N/A;  . LASIK      Family History:  Family History  Problem Relation Age of Onset  . Rheum arthritis Mother   . Hypothyroidism Mother   . Hypertension Mother   . Thyroid disease Mother   . Diabetes Father   . Prostate cancer Father 1  . Breast cancer Maternal Grandmother 80    Social History:  Social History   Socioeconomic History  . Marital status: Married    Spouse name: Jaquelyn Bitter  .  Number of children: Not on file  . Years of education: Not on file  . Highest education level: Not on file  Occupational History  . Not on file  Tobacco Use  . Smoking status: Never Smoker  . Smokeless tobacco: Never Used  Vaping Use  . Vaping Use: Never used  Substance and Sexual Activity  . Alcohol use: Not Currently    Comment: occ  . Drug use: No  . Sexual activity: Not Currently    Partners: Male    Birth control/protection: None  Other Topics Concern  . Not on file  Social History Narrative  . Not on file   Social Determinants of Health   Financial Resource Strain: Not on file  Food Insecurity: Not on file  Transportation Needs: Not on file  Physical Activity: Not on file  Stress: Not on file  Social Connections: Not on file  Intimate Partner Violence: Not on file    Allergies:  Allergies  Allergen Reactions  . Other Rash    ADHESIVE FROM BANDAIDS-"BURNS SKIN"    Medications: Prior to Admission medications   Medication Sig Start Date End Date Taking? Authorizing Provider  ibuprofen (ADVIL) 800 MG tablet Take 1 tablet (800 mg total) by mouth every 8 (eight) hours. 03/01/21  Yes Rod Can, CNM  Prenatal Vit-Fe Fumarate-FA (MULTIVITAMIN-PRENATAL) 27-0.8 MG TABS tablet Take 1 tablet by mouth daily.   Yes [provider]    Physical Exam Vitals:  Vitals:  04/13/21 0918  BP: 120/72    Physical Exam Constitutional:      Appearance: Normal appearance. She is well-developed.  HENT:     Head: Normocephalic and atraumatic.  Eyes:     Extraocular Movements: Extraocular movements intact.     Pupils: Pupils are equal, round, and reactive to light.  Neck:     Thyroid: No thyromegaly.  Cardiovascular:     Rate and Rhythm: Normal rate and regular rhythm.     Heart sounds: Normal heart sounds.  Pulmonary:     Effort: Pulmonary effort is normal.     Breath sounds: Normal breath sounds.  Abdominal:     General: Bowel sounds are normal. There is  no distension.     Palpations: Abdomen is soft. There is no mass.     Comments: Incision is well healed  Musculoskeletal:     Cervical back: Neck supple.  Neurological:     Mental Status: She is alert and oriented to person, place, and time.  Skin:    General: Skin is warm and dry.  Psychiatric:        Behavior: Behavior normal.        Thought Content: Thought content normal.        Judgment: Judgment normal.  Vitals reviewed.    Assessment: 40 y.o. [id number] presenting for 6 week postpartum visit  Plan: Problem List Items Addressed This Visit   None   Visit Diagnoses    Decreased lactation    -  Primary   Relevant Medications   metoCLOPramide (REGLAN) 10 MG tablet      1) Contraception-  condoms  2)  Pap: up to date  3) Patient underwent screening for postpartum depression with no concerns noted.  4) Low supply- discussed reglan trial to help with lactation. Patient pumping between and after feedings.   - Follow up  for routine annual exam  Adrian Prows MD, [place], [facility] 04/13/2021 12:33 PM

## 2022-10-31 ENCOUNTER — Ambulatory Visit (HOSPITAL_BASED_OUTPATIENT_CLINIC_OR_DEPARTMENT_OTHER): Admit: 2022-10-31 | Payer: 59 | Admitting: Obstetrics and Gynecology

## 2022-10-31 ENCOUNTER — Encounter (HOSPITAL_BASED_OUTPATIENT_CLINIC_OR_DEPARTMENT_OTHER): Payer: Self-pay

## 2022-10-31 SURGERY — SALPINGECTOMY, BILATERAL, LAPAROSCOPIC
Anesthesia: Choice | Laterality: Bilateral

## 2022-11-25 ENCOUNTER — Emergency Department (HOSPITAL_BASED_OUTPATIENT_CLINIC_OR_DEPARTMENT_OTHER): Payer: 59 | Admitting: Radiology

## 2022-11-25 ENCOUNTER — Other Ambulatory Visit: Payer: Self-pay

## 2022-11-25 ENCOUNTER — Encounter (HOSPITAL_BASED_OUTPATIENT_CLINIC_OR_DEPARTMENT_OTHER): Payer: Self-pay

## 2022-11-25 ENCOUNTER — Emergency Department (HOSPITAL_BASED_OUTPATIENT_CLINIC_OR_DEPARTMENT_OTHER)
Admission: EM | Admit: 2022-11-25 | Discharge: 2022-11-25 | Disposition: A | Discharge status: Home / Self Care | Payer: 59 | Attending: Emergency Medicine | Admitting: Emergency Medicine

## 2022-11-25 DIAGNOSIS — S39012A Strain of muscle, fascia and tendon of lower back, initial encounter: Secondary | ICD-10-CM | POA: Insufficient documentation

## 2022-11-25 DIAGNOSIS — S3992XA Unspecified injury of lower back, initial encounter: Secondary | ICD-10-CM | POA: Diagnosis present

## 2022-11-25 DIAGNOSIS — X501XXA Overexertion from prolonged static or awkward postures, initial encounter: Secondary | ICD-10-CM | POA: Insufficient documentation

## 2022-11-25 LAB — URINALYSIS, ROUTINE W REFLEX MICROSCOPIC
Bacteria, UA: NONE SEEN
Bilirubin Urine: NEGATIVE
Glucose, UA: NEGATIVE mg/dL
Hgb urine dipstick: NEGATIVE
Ketones, ur: 15 mg/dL — AB
Leukocytes,Ua: NEGATIVE
Nitrite: NEGATIVE
Protein, ur: 30 mg/dL — AB
Specific Gravity, Urine: 1.035 — ABNORMAL HIGH (ref 1.005–1.030)
pH: 6.5 (ref 5.0–8.0)

## 2022-11-25 LAB — PREGNANCY, URINE: Preg Test, Ur: NEGATIVE

## 2022-11-25 MED ORDER — LIDOCAINE 5 % EX PTCH
1.0000 | MEDICATED_PATCH | CUTANEOUS | Status: DC
  Administered 2022-11-25: 1 via TRANSDERMAL
  Filled 2022-11-25: qty 1

## 2022-11-25 MED ORDER — METHOCARBAMOL 500 MG PO TABS: 500.0000 mg | ORAL_TABLET | Freq: Two times a day (BID) | ORAL | 0 refills | Status: AC

## 2022-11-25 MED ORDER — HYDROCODONE-ACETAMINOPHEN 5-325 MG PO TABS
1.0000 | ORAL_TABLET | Freq: Once | ORAL | Status: AC
  Administered 2022-11-25: 1 via ORAL
  Filled 2022-11-25: qty 1

## 2022-11-25 MED ORDER — PREDNISONE 10 MG (21) PO TBPK: ORAL_TABLET | Freq: Every day | ORAL | 0 refills | Status: AC

## 2022-11-25 MED ORDER — METHYLPREDNISOLONE ACETATE 80 MG/ML IJ SUSP
40.0000 mg | Freq: Once | INTRAMUSCULAR | Status: AC
  Administered 2022-11-25: 40 mg via INTRAMUSCULAR
  Filled 2022-11-25: qty 1

## 2022-11-25 MED ORDER — KETOROLAC TROMETHAMINE 15 MG/ML IJ SOLN
15.0000 mg | Freq: Once | INTRAMUSCULAR | Status: AC
  Administered 2022-11-25: 15 mg via INTRAMUSCULAR
  Filled 2022-11-25: qty 1

## 2022-11-25 NOTE — ED Triage Notes (Signed)
Patient here POV from Home.  Endorses Lower Back Pain that began last PM when she bent forward to pick up an item and felt a "Pop". Since then, she has continued pain that worse with movement.  NAD Noted during Triage. A&Ox4. GCS 15. Ambulatory.

## 2022-11-25 NOTE — ED Provider Notes (Signed)
[facility] Provider Note   CSN: [id number] Arrival date & time: 11/25/22  1045     History  Chief Complaint  Patient presents with   Back Pain    Toni Byrd is a 42 y.o. female.  42 year old female with past medical history of fibroids and anemia presents with concern for low back pain.  Patient states that she bent over yesterday to help her child with a cup when she felt a pop in her low back with severe pain.  Patient is taking ibuprofen without improvement in her pain.  Pain is worse with any kind of movement.  Reports having some back pain ever since having her epidurals with her last 1 being April 2022.  She denies abdominal pain, loss of bowel or bladder control, saddle paresthesias, fever, trauma, IV drug abuse.  No other complaints or concerns.       Home Medications Prior to Admission medications   Medication Sig Start Date End Date Taking? Authorizing Provider  methocarbamol (ROBAXIN) 500 MG tablet Take 1 tablet (500 mg total) by mouth 2 (two) times daily. 11/25/22  Yes Tacy Learn, PA-C  predniSONE (STERAPRED UNI-PAK 21 TAB) 10 MG (21) TBPK tablet Take by mouth daily. Take 6 tabs by mouth daily  for 2 days, then 5 tabs for 2 days, then 4 tabs for 2 days, then 3 tabs for 2 days, 2 tabs for 2 days, then 1 tab by mouth daily for 2 days 11/25/22  Yes Tacy Learn, PA-C  ibuprofen (ADVIL) 800 MG tablet Take 1 tablet (800 mg total) by mouth every 8 (eight) hours. 03/01/21   Rod Can, CNM  metoCLOPramide (REGLAN) 10 MG tablet Take 1 tablet (10 mg total) by mouth 3 (three) times daily before meals. 04/13/21   Schuman, Stefanie Libel, MD  Prenatal Vit-Fe Fumarate-FA (MULTIVITAMIN-PRENATAL) 27-0.8 MG TABS tablet Take 1 tablet by mouth daily.    [provider]      Allergies    Other    Review of Systems   Review of Systems Negative except as per HPI Physical Exam Updated Vital Signs BP 122/86   Pulse 83   Temp 98.1 F  (36.7 C) (Oral)   Resp 18   Ht '5\' 7"'$  (1.702 m)   Wt 87.8 kg   SpO2 99%   BMI 30.32 kg/m  Physical Exam Vitals and nursing note reviewed.  Constitutional:      General: She is not in acute distress.    Appearance: She is well-developed. She is not diaphoretic.  HENT:     Head: Normocephalic and atraumatic.  Cardiovascular:     Pulses: Normal pulses.  Pulmonary:     Effort: Pulmonary effort is normal.  Abdominal:     Palpations: Abdomen is soft.     Tenderness: There is no abdominal tenderness.  Musculoskeletal:        General: Tenderness present. No swelling or deformity.     Lumbar back: Tenderness present. No bony tenderness. Positive right straight leg raise test and positive left straight leg raise test.       Back:     Right lower leg: No edema.     Left lower leg: No edema.  Skin:    General: Skin is warm and dry.     Findings: No erythema or rash.  Neurological:     Mental Status: She is alert and oriented to person, place, and time.     Sensory: No sensory deficit.  Motor: No weakness.     Gait: Gait normal.     Deep Tendon Reflexes: Reflexes normal.  Psychiatric:        Behavior: Behavior normal.     ED Results / Procedures / Treatments   Labs (all labs ordered are listed, but only abnormal results are displayed) Labs Reviewed  URINALYSIS, ROUTINE W REFLEX MICROSCOPIC - Abnormal; Notable for the following components:      Result Value   Specific Gravity, Urine 1.035 (*)    Ketones, ur 15 (*)    Protein, ur 30 (*)    All other components within normal limits  PREGNANCY, URINE    EKG None  Radiology DG Lumbar Spine 2-3 Views  Result Date: 11/25/2022 CLINICAL DATA:  Low back pain EXAM: LUMBAR SPINE - 2-3 VIEW COMPARISON:  None Available. FINDINGS: Normal alignment. Vertebral body heights and intervertebral disc spaces are maintained. No focal osseous lesion. Nonobstructive bowel gas pattern. SI joints are open and symmetric. IMPRESSION: Negative  lumbar spine radiographs. Electronically Signed   By: Audie Pinto M.D.   On: 11/25/2022 12:20    Procedures Procedures    Medications Ordered in ED Medications  lidocaine (LIDODERM) 5 % 1 patch (has no administration in time range)  ketorolac (TORADOL) 15 MG/ML injection 15 mg (has no administration in time range)  HYDROcodone-acetaminophen (NORCO/VICODIN) 5-325 MG per tablet 1 tablet (has no administration in time range)  methylPREDNISolone acetate (DEPO-MEDROL) injection 40 mg (has no administration in time range)    ED Course/ Medical Decision Making/ A&P                             Medical Decision Making Amount and/or Complexity of Data Reviewed Labs: ordered. Radiology: ordered.  Risk Prescription drug management.   42 year old female with complaint of low back pain as described above.  No red flags, found to have tenderness to left paraspinous lumbar into left SI, right SI tenderness with straight leg raise positive on both sides.  No midline or bony tenderness.  Plan is to treat with IM Depo-Medrol, course of steroids, Robaxin and Lidoderm.  Provided with Norco x 1 here.  Recommend warm compresses with gentle stretching.  Patient to follow-up with PCP this week for recheck and possible referral to PT. X-ray of the lumbar spine as ordered interpreted myself is negative for acute bony injury.  Read with radiologist or potation.        Final Clinical Impression(s) / ED Diagnoses Final diagnoses:  Strain of lumbar region, initial encounter    Rx / DC Orders ED Discharge Orders          Ordered    predniSONE (STERAPRED UNI-PAK 21 TAB) 10 MG (21) TBPK tablet  Daily        11/25/22 1302    methocarbamol (ROBAXIN) 500 MG tablet  2 times daily        11/25/22 1302              Tacy Learn, PA-C 11/25/22 1309    Fransico Meadow, MD 11/28/22 [phone]

## 2022-11-25 NOTE — Discharge Instructions (Addendum)
Robaxin as needed as prescribed, do not drive or operate machinery until you know how this medication effects you.  Prednisone as prescribed- start tomorrow.  Lidocaine patch as needed as directed. Warm compresses for 20 minutes, follow with gentle exercises.  Follow up with your doctor, call today to schedule an appointment.

## 2023-01-17 ENCOUNTER — Other Ambulatory Visit (HOSPITAL_BASED_OUTPATIENT_CLINIC_OR_DEPARTMENT_OTHER): Payer: Self-pay

## 2023-01-17 MED ORDER — WEGOVY 0.25 MG/0.5ML ~~LOC~~ SOAJ
0.2500 mg | SUBCUTANEOUS | 0 refills | Status: AC
  Filled 2023-01-17: qty 2, 28d supply, fill #0

## 2023-01-31 ENCOUNTER — Other Ambulatory Visit (HOSPITAL_BASED_OUTPATIENT_CLINIC_OR_DEPARTMENT_OTHER): Payer: Self-pay

## 2023-01-31 MED ORDER — WEGOVY 1 MG/0.5ML ~~LOC~~ SOAJ
1.0000 mg | SUBCUTANEOUS | 0 refills | Status: AC
  Filled 2023-02-22: qty 2, 28d supply, fill #0

## 2023-01-31 MED ORDER — WEGOVY 0.5 MG/0.5ML ~~LOC~~ SOAJ
0.5000 mg | SUBCUTANEOUS | 0 refills | Status: AC
  Filled 2023-01-31: qty 2, 28d supply, fill #0

## 2023-02-22 ENCOUNTER — Other Ambulatory Visit (HOSPITAL_BASED_OUTPATIENT_CLINIC_OR_DEPARTMENT_OTHER): Payer: Self-pay

## 2023-02-26 ENCOUNTER — Other Ambulatory Visit (HOSPITAL_BASED_OUTPATIENT_CLINIC_OR_DEPARTMENT_OTHER): Payer: Self-pay

## 2023-02-26 MED ORDER — WEGOVY 1.7 MG/0.75ML ~~LOC~~ SOAJ
1.7000 mg | SUBCUTANEOUS | 1 refills | Status: DC
  Filled 2023-02-26: qty 3, 28d supply, fill #0
  Filled 2023-04-02: qty 3, 28d supply, fill #1

## 2023-04-10 ENCOUNTER — Other Ambulatory Visit (HOSPITAL_BASED_OUTPATIENT_CLINIC_OR_DEPARTMENT_OTHER): Payer: Self-pay

## 2023-04-10 MED ORDER — WEGOVY 1.7 MG/0.75ML ~~LOC~~ SOAJ
1.7000 mg | SUBCUTANEOUS | 1 refills | Status: AC
  Filled 2023-04-10 – 2024-03-28 (×2): qty 3, 28d supply, fill #0

## 2023-05-07 ENCOUNTER — Other Ambulatory Visit (HOSPITAL_BASED_OUTPATIENT_CLINIC_OR_DEPARTMENT_OTHER): Payer: Self-pay

## 2023-05-07 MED ORDER — WEGOVY 1.7 MG/0.75ML ~~LOC~~ SOAJ
1.7000 mg | SUBCUTANEOUS | 1 refills | Status: AC
  Filled 2023-05-07: qty 3, 28d supply, fill #0
  Filled 2023-08-16: qty 3, 28d supply, fill #1

## 2023-05-07 MED ORDER — VITAMIN D (ERGOCALCIFEROL) 1.25 MG (50000 UNIT) PO CAPS
50000.0000 [IU] | ORAL_CAPSULE | ORAL | 2 refills | Status: AC
  Filled 2023-05-07: qty 4, 28d supply, fill #0
  Filled 2023-07-18: qty 4, 28d supply, fill #1
  Filled 2023-08-12: qty 4, 28d supply, fill #2

## 2023-06-05 ENCOUNTER — Other Ambulatory Visit (HOSPITAL_BASED_OUTPATIENT_CLINIC_OR_DEPARTMENT_OTHER): Payer: Self-pay

## 2023-06-05 MED ORDER — WEGOVY 1.7 MG/0.75ML ~~LOC~~ SOAJ
1.7000 mg | SUBCUTANEOUS | 1 refills | Status: DC
  Filled 2023-06-05: qty 3, 28d supply, fill #0
  Filled 2023-07-16: qty 3, 28d supply, fill #1

## 2023-07-18 ENCOUNTER — Other Ambulatory Visit (HOSPITAL_BASED_OUTPATIENT_CLINIC_OR_DEPARTMENT_OTHER): Payer: Self-pay

## 2023-08-12 ENCOUNTER — Other Ambulatory Visit (HOSPITAL_BASED_OUTPATIENT_CLINIC_OR_DEPARTMENT_OTHER): Payer: Self-pay

## 2023-08-13 ENCOUNTER — Other Ambulatory Visit (HOSPITAL_BASED_OUTPATIENT_CLINIC_OR_DEPARTMENT_OTHER): Payer: Self-pay

## 2023-08-13 MED ORDER — WEGOVY 1.7 MG/0.75ML ~~LOC~~ SOAJ
1.7000 mg | SUBCUTANEOUS | 1 refills | Status: AC
  Filled 2023-08-13 – 2024-03-28 (×2): qty 3, 28d supply, fill #0

## 2023-08-14 ENCOUNTER — Other Ambulatory Visit: Payer: Self-pay

## 2023-08-14 ENCOUNTER — Other Ambulatory Visit (HOSPITAL_BASED_OUTPATIENT_CLINIC_OR_DEPARTMENT_OTHER): Payer: Self-pay

## 2023-08-15 ENCOUNTER — Other Ambulatory Visit (HOSPITAL_BASED_OUTPATIENT_CLINIC_OR_DEPARTMENT_OTHER): Payer: Self-pay

## 2023-08-15 ENCOUNTER — Other Ambulatory Visit: Payer: Self-pay

## 2023-08-15 MED ORDER — WEGOVY 1.7 MG/0.75ML ~~LOC~~ SOAJ
1.7000 mg | SUBCUTANEOUS | 1 refills | Status: AC
  Filled 2023-08-15: qty 3, 28d supply, fill #0

## 2023-08-20 ENCOUNTER — Other Ambulatory Visit (HOSPITAL_BASED_OUTPATIENT_CLINIC_OR_DEPARTMENT_OTHER): Payer: Self-pay

## 2023-08-25 ENCOUNTER — Other Ambulatory Visit (HOSPITAL_BASED_OUTPATIENT_CLINIC_OR_DEPARTMENT_OTHER): Payer: Self-pay

## 2023-09-10 ENCOUNTER — Other Ambulatory Visit (HOSPITAL_BASED_OUTPATIENT_CLINIC_OR_DEPARTMENT_OTHER): Payer: Self-pay

## 2023-09-10 MED ORDER — WEGOVY 1.7 MG/0.75ML ~~LOC~~ SOAJ
1.7000 mg | SUBCUTANEOUS | 1 refills | Status: AC
  Filled 2023-09-10: qty 3, 28d supply, fill #0
  Filled 2023-10-03: qty 3, 28d supply, fill #1

## 2023-10-23 ENCOUNTER — Other Ambulatory Visit (HOSPITAL_BASED_OUTPATIENT_CLINIC_OR_DEPARTMENT_OTHER): Payer: Self-pay

## 2023-10-23 MED ORDER — WEGOVY 1.7 MG/0.75ML ~~LOC~~ SOAJ
1.7000 mg | SUBCUTANEOUS | 1 refills | Status: AC
  Filled 2023-10-23 – 2024-09-08 (×2): qty 3, 28d supply, fill #0

## 2023-12-17 ENCOUNTER — Other Ambulatory Visit (HOSPITAL_BASED_OUTPATIENT_CLINIC_OR_DEPARTMENT_OTHER): Payer: Self-pay

## 2023-12-17 MED ORDER — WEGOVY 1.7 MG/0.75ML ~~LOC~~ SOAJ
1.7000 mg | SUBCUTANEOUS | 1 refills | Status: AC
Start: 2023-12-17 — End: ?
  Filled 2023-12-17: qty 3, 28d supply, fill #0
  Filled 2024-10-26: qty 3, 28d supply, fill #1

## 2024-01-28 ENCOUNTER — Other Ambulatory Visit (HOSPITAL_BASED_OUTPATIENT_CLINIC_OR_DEPARTMENT_OTHER): Payer: Self-pay

## 2024-01-28 MED ORDER — WEGOVY 1.7 MG/0.75ML ~~LOC~~ SOAJ
1.7000 mg | SUBCUTANEOUS | 1 refills | Status: AC
  Filled 2024-01-28: qty 3, 28d supply, fill #0
  Filled 2024-03-17: qty 3, 28d supply, fill #1

## 2024-01-30 ENCOUNTER — Other Ambulatory Visit (HOSPITAL_BASED_OUTPATIENT_CLINIC_OR_DEPARTMENT_OTHER): Payer: Self-pay

## 2024-03-17 ENCOUNTER — Other Ambulatory Visit (HOSPITAL_BASED_OUTPATIENT_CLINIC_OR_DEPARTMENT_OTHER): Payer: Self-pay

## 2024-03-20 ENCOUNTER — Other Ambulatory Visit (HOSPITAL_BASED_OUTPATIENT_CLINIC_OR_DEPARTMENT_OTHER): Payer: Self-pay

## 2024-03-23 ENCOUNTER — Other Ambulatory Visit (HOSPITAL_BASED_OUTPATIENT_CLINIC_OR_DEPARTMENT_OTHER): Payer: Self-pay

## 2024-03-24 ENCOUNTER — Other Ambulatory Visit (HOSPITAL_BASED_OUTPATIENT_CLINIC_OR_DEPARTMENT_OTHER): Payer: Self-pay

## 2024-03-24 MED ORDER — WEGOVY 1.7 MG/0.75ML ~~LOC~~ SOAJ
1.7000 mg | SUBCUTANEOUS | 1 refills | Status: AC
  Filled 2024-03-24 – 2024-05-23 (×2): qty 3, 28d supply, fill #0
  Filled 2024-11-29 – 2024-12-09 (×3): qty 3, 28d supply, fill #1

## 2024-03-25 ENCOUNTER — Other Ambulatory Visit: Payer: Self-pay

## 2024-03-28 ENCOUNTER — Other Ambulatory Visit (HOSPITAL_BASED_OUTPATIENT_CLINIC_OR_DEPARTMENT_OTHER): Payer: Self-pay

## 2024-03-30 ENCOUNTER — Other Ambulatory Visit (HOSPITAL_BASED_OUTPATIENT_CLINIC_OR_DEPARTMENT_OTHER): Payer: Self-pay

## 2024-03-30 ENCOUNTER — Other Ambulatory Visit (HOSPITAL_BASED_OUTPATIENT_CLINIC_OR_DEPARTMENT_OTHER): Payer: Self-pay | Admitting: Family Medicine

## 2024-03-30 DIAGNOSIS — Z1231 Encounter for screening mammogram for malignant neoplasm of breast: Secondary | ICD-10-CM

## 2024-04-01 ENCOUNTER — Other Ambulatory Visit (HOSPITAL_BASED_OUTPATIENT_CLINIC_OR_DEPARTMENT_OTHER): Payer: Self-pay

## 2024-04-03 ENCOUNTER — Other Ambulatory Visit (HOSPITAL_BASED_OUTPATIENT_CLINIC_OR_DEPARTMENT_OTHER): Payer: Self-pay

## 2024-04-05 ENCOUNTER — Other Ambulatory Visit: Payer: Self-pay

## 2024-04-07 ENCOUNTER — Other Ambulatory Visit (HOSPITAL_BASED_OUTPATIENT_CLINIC_OR_DEPARTMENT_OTHER): Payer: Self-pay

## 2024-04-07 ENCOUNTER — Encounter (HOSPITAL_BASED_OUTPATIENT_CLINIC_OR_DEPARTMENT_OTHER): Payer: Self-pay | Admitting: Radiology

## 2024-04-07 ENCOUNTER — Ambulatory Visit (HOSPITAL_BASED_OUTPATIENT_CLINIC_OR_DEPARTMENT_OTHER)
Admission: RE | Admit: 2024-04-07 | Discharge: 2024-04-07 | Disposition: A | Discharge status: Home / Self Care | Source: Ambulatory Visit | Attending: Family Medicine | Admitting: Family Medicine

## 2024-04-07 DIAGNOSIS — Z1231 Encounter for screening mammogram for malignant neoplasm of breast: Secondary | ICD-10-CM | POA: Insufficient documentation

## 2024-04-15 ENCOUNTER — Other Ambulatory Visit (HOSPITAL_BASED_OUTPATIENT_CLINIC_OR_DEPARTMENT_OTHER): Payer: Self-pay

## 2024-05-23 ENCOUNTER — Other Ambulatory Visit (HOSPITAL_BASED_OUTPATIENT_CLINIC_OR_DEPARTMENT_OTHER): Payer: Self-pay

## 2024-05-25 ENCOUNTER — Other Ambulatory Visit (HOSPITAL_BASED_OUTPATIENT_CLINIC_OR_DEPARTMENT_OTHER): Payer: Self-pay

## 2024-05-25 MED ORDER — WEGOVY 1.7 MG/0.75ML ~~LOC~~ SOAJ
1.7000 mg | SUBCUTANEOUS | 1 refills | Status: AC
  Filled 2024-05-25: qty 3, 28d supply, fill #0

## 2024-07-28 ENCOUNTER — Other Ambulatory Visit (HOSPITAL_BASED_OUTPATIENT_CLINIC_OR_DEPARTMENT_OTHER): Payer: Self-pay

## 2024-07-28 MED ORDER — WEGOVY 1.7 MG/0.75ML ~~LOC~~ SOAJ
1.7000 mg | SUBCUTANEOUS | 1 refills | Status: AC
  Filled 2024-07-28: qty 3, 28d supply, fill #0

## 2024-09-08 ENCOUNTER — Other Ambulatory Visit (HOSPITAL_BASED_OUTPATIENT_CLINIC_OR_DEPARTMENT_OTHER): Payer: Self-pay

## 2024-09-29 ENCOUNTER — Other Ambulatory Visit (HOSPITAL_BASED_OUTPATIENT_CLINIC_OR_DEPARTMENT_OTHER): Payer: Self-pay

## 2024-09-29 MED ORDER — WEGOVY 1.7 MG/0.75ML ~~LOC~~ SOAJ
1.7000 mg | SUBCUTANEOUS | 2 refills | Status: AC
  Filled 2024-09-29: qty 3, 30d supply, fill #0

## 2024-10-06 ENCOUNTER — Other Ambulatory Visit (HOSPITAL_BASED_OUTPATIENT_CLINIC_OR_DEPARTMENT_OTHER): Payer: Self-pay

## 2024-10-28 ENCOUNTER — Other Ambulatory Visit (HOSPITAL_BASED_OUTPATIENT_CLINIC_OR_DEPARTMENT_OTHER): Payer: Self-pay

## 2024-11-30 ENCOUNTER — Encounter (HOSPITAL_BASED_OUTPATIENT_CLINIC_OR_DEPARTMENT_OTHER): Payer: Self-pay

## 2024-11-30 ENCOUNTER — Other Ambulatory Visit (HOSPITAL_BASED_OUTPATIENT_CLINIC_OR_DEPARTMENT_OTHER): Payer: Self-pay

## 2024-12-02 ENCOUNTER — Other Ambulatory Visit (HOSPITAL_BASED_OUTPATIENT_CLINIC_OR_DEPARTMENT_OTHER): Payer: Self-pay

## 2024-12-09 ENCOUNTER — Other Ambulatory Visit (HOSPITAL_BASED_OUTPATIENT_CLINIC_OR_DEPARTMENT_OTHER): Payer: Self-pay

## 2024-12-12 ENCOUNTER — Other Ambulatory Visit (HOSPITAL_BASED_OUTPATIENT_CLINIC_OR_DEPARTMENT_OTHER): Payer: Self-pay

## 2024-12-17 ENCOUNTER — Other Ambulatory Visit (HOSPITAL_BASED_OUTPATIENT_CLINIC_OR_DEPARTMENT_OTHER): Payer: Self-pay

## 2024-12-18 ENCOUNTER — Other Ambulatory Visit (HOSPITAL_BASED_OUTPATIENT_CLINIC_OR_DEPARTMENT_OTHER): Payer: Self-pay
